# Patient Record
Sex: Female | Born: 1955 | Race: White | Hispanic: No | Marital: Single | Smoking: Never smoker
Health system: Southern US, Community
[De-identification: ages and names within clinical notes are randomized; demographics above are authoritative.]

## PROBLEM LIST (undated history)

## (undated) DIAGNOSIS — R7303 Prediabetes: Secondary | ICD-10-CM

## (undated) DIAGNOSIS — I519 Heart disease, unspecified: Secondary | ICD-10-CM

## (undated) DIAGNOSIS — N809 Endometriosis, unspecified: Secondary | ICD-10-CM

## (undated) DIAGNOSIS — C801 Malignant (primary) neoplasm, unspecified: Secondary | ICD-10-CM

## (undated) DIAGNOSIS — I4891 Unspecified atrial fibrillation: Secondary | ICD-10-CM

## (undated) DIAGNOSIS — D649 Anemia, unspecified: Secondary | ICD-10-CM

## (undated) DIAGNOSIS — N289 Disorder of kidney and ureter, unspecified: Secondary | ICD-10-CM

## (undated) DIAGNOSIS — M109 Gout, unspecified: Secondary | ICD-10-CM

## (undated) DIAGNOSIS — E669 Obesity, unspecified: Secondary | ICD-10-CM

## (undated) DIAGNOSIS — E039 Hypothyroidism, unspecified: Secondary | ICD-10-CM

## (undated) DIAGNOSIS — I517 Cardiomegaly: Secondary | ICD-10-CM

## (undated) DIAGNOSIS — I499 Cardiac arrhythmia, unspecified: Secondary | ICD-10-CM

## (undated) DIAGNOSIS — K219 Gastro-esophageal reflux disease without esophagitis: Secondary | ICD-10-CM

## (undated) HISTORY — PX: TONSILLECTOMY: SUR1361

## (undated) HISTORY — PX: ADENOIDECTOMY: SUR15

## (undated) HISTORY — PX: DILATION AND CURETTAGE OF UTERUS: SHX78

## (undated) HISTORY — PX: ABDOMINAL HYSTERECTOMY: SHX81

---

## 2012-02-25 ENCOUNTER — Ambulatory Visit: Payer: Self-pay | Admitting: Internal Medicine

## 2014-07-08 ENCOUNTER — Ambulatory Visit: Payer: Self-pay | Admitting: Family Medicine

## 2015-05-18 ENCOUNTER — Ambulatory Visit
Admission: EM | Admit: 2015-05-18 | Discharge: 2015-05-18 | Disposition: A | Discharge status: Home / Self Care | Payer: BLUE CROSS/BLUE SHIELD | Attending: Family Medicine | Admitting: Family Medicine

## 2015-05-18 ENCOUNTER — Encounter: Payer: Self-pay | Admitting: *Deleted

## 2015-05-18 DIAGNOSIS — N390 Urinary tract infection, site not specified: Secondary | ICD-10-CM

## 2015-05-18 DIAGNOSIS — J069 Acute upper respiratory infection, unspecified: Secondary | ICD-10-CM

## 2015-05-18 HISTORY — DX: Heart disease, unspecified: I51.9

## 2015-05-18 HISTORY — DX: Prediabetes: R73.03

## 2015-05-18 HISTORY — DX: Cardiomegaly: I51.7

## 2015-05-18 HISTORY — DX: Hypothyroidism, unspecified: E03.9

## 2015-05-18 HISTORY — DX: Endometriosis, unspecified: N80.9

## 2015-05-18 HISTORY — DX: Cardiac arrhythmia, unspecified: I49.9

## 2015-05-18 HISTORY — DX: Anemia, unspecified: D64.9

## 2015-05-18 HISTORY — DX: Gastro-esophageal reflux disease without esophagitis: K21.9

## 2015-05-18 HISTORY — DX: Disorder of kidney and ureter, unspecified: N28.9

## 2015-05-18 HISTORY — DX: Gout, unspecified: M10.9

## 2015-05-18 HISTORY — DX: Obesity, unspecified: E66.9

## 2015-05-18 HISTORY — DX: Malignant (primary) neoplasm, unspecified: C80.1

## 2015-05-18 LAB — URINALYSIS COMPLETE WITH MICROSCOPIC (ARMC ONLY)
Glucose, UA: NEGATIVE mg/dL
NITRITE: POSITIVE — AB
PH: 6 (ref 5.0–8.0)
SPECIFIC GRAVITY, URINE: 1.02 (ref 1.005–1.030)

## 2015-05-18 MED ORDER — SULFAMETHOXAZOLE-TRIMETHOPRIM 800-160 MG PO TABS: 1.0000 | ORAL_TABLET | Freq: Two times a day (BID) | ORAL | Status: AC

## 2015-05-18 NOTE — ED Notes (Signed)
Patient started having symptoms of headache and unusual sensation while urinating last Friday. Blood present in urine on Saturday. No symptoms of burning or pain. Patient does have nasal congestion and ear fullness lasting for the past 1.5 weeks.

## 2015-05-18 NOTE — Discharge Instructions (Signed)
Take medication as prescribed. Rest. Drink plenty of fluids.   Follow up closely with your primary care physician this week. Return to Urgent care for new or worsening concerns.   Urinary Tract Infection Urinary tract infections (UTIs) can develop anywhere along your urinary tract. Your urinary tract is your body's drainage system for removing wastes and extra water. Your urinary tract includes two kidneys, two ureters, a bladder, and a urethra. Your kidneys are a pair of bean-shaped organs. Each kidney is about the size of your fist. They are located below your ribs, one on each side of your spine. CAUSES Infections are caused by microbes, which are microscopic organisms, including fungi, viruses, and bacteria. These organisms are so small that they can only be seen through a microscope. Bacteria are the microbes that most commonly cause UTIs. SYMPTOMS  Symptoms of UTIs may vary by age and gender of the patient and by the location of the infection. Symptoms in young women typically include a frequent and intense urge to urinate and a painful, burning feeling in the bladder or urethra during urination. Older women and men are more likely to be tired, shaky, and weak and have muscle aches and abdominal pain. A fever may mean the infection is in your kidneys. Other symptoms of a kidney infection include pain in your back or sides below the ribs, nausea, and vomiting. DIAGNOSIS To diagnose a UTI, your caregiver will ask you about your symptoms. Your caregiver will also ask you to provide a urine sample. The urine sample will be tested for bacteria and white blood cells. White blood cells are made by your body to help fight infection. TREATMENT  Typically, UTIs can be treated with medication. Because most UTIs are caused by a bacterial infection, they usually can be treated with the use of antibiotics. The choice of antibiotic and length of treatment depend on your symptoms and the type of bacteria causing  your infection. HOME CARE INSTRUCTIONS  If you were prescribed antibiotics, take them exactly as your caregiver instructs you. Finish the medication even if you feel better after you have only taken some of the medication.  Drink enough water and fluids to keep your urine clear or pale yellow.  Avoid caffeine, tea, and carbonated beverages. They tend to irritate your bladder.  Empty your bladder often. Avoid holding urine for long periods of time.  Empty your bladder before and after sexual intercourse.  After a bowel movement, women should cleanse from front to back. Use each tissue only once. SEEK MEDICAL CARE IF:   You have back pain.  You develop a fever.  Your symptoms do not begin to resolve within 3 days. SEEK IMMEDIATE MEDICAL CARE IF:   You have severe back pain or lower abdominal pain.  You develop chills.  You have nausea or vomiting.  You have continued burning or discomfort with urination. MAKE SURE YOU:   Understand these instructions.  Will watch your condition.  Will get help right away if you are not doing well or get worse.   This information is not intended to replace advice given to you by your health care provider. Make sure you discuss any questions you have with your health care provider.   Document Released: 01/19/2005 Document Revised: 12/31/2014 Document Reviewed: 05/20/2011 Elsevier Interactive Patient Education 2016 [employer]  Upper Respiratory Infection, Adult Most upper respiratory infections (URIs) are a viral infection of the air passages leading to the lungs. A URI affects the nose, throat, and  upper air passages. The most common type of URI is nasopharyngitis and is typically referred to as "the common cold." URIs run their course and usually go away on their own. Most of the time, a URI does not require medical attention, but sometimes a bacterial infection in the upper airways can follow a viral infection. This is called a  secondary infection. Sinus and middle ear infections are common types of secondary upper respiratory infections. Bacterial pneumonia can also complicate a URI. A URI can worsen asthma and chronic obstructive pulmonary disease (COPD). Sometimes, these complications can require emergency medical care and may be life threatening.  CAUSES Almost all URIs are caused by viruses. A virus is a type of germ and can spread from one person to another.  RISKS FACTORS You may be at risk for a URI if:   You smoke.   You have chronic heart or lung disease.  You have a weakened defense (immune) system.   You are very young or very old.   You have nasal allergies or asthma.  You work in crowded or poorly ventilated areas.  You work in health care facilities or schools. SIGNS AND SYMPTOMS  Symptoms typically develop 2-3 days after you come in contact with a cold virus. Most viral URIs last 7-10 days. However, viral URIs from the influenza virus (flu virus) can last 14-18 days and are typically more severe. Symptoms may include:   Runny or stuffy (congested) nose.   Sneezing.   Cough.   Sore throat.   Headache.   Fatigue.   Fever.   Loss of appetite.   Pain in your forehead, behind your eyes, and over your cheekbones (sinus pain).  Muscle aches.  DIAGNOSIS  Your health care provider may diagnose a URI by:  Physical exam.  Tests to check that your symptoms are not due to another condition such as:  Strep throat.  Sinusitis.  Pneumonia.  Asthma. TREATMENT  A URI goes away on its own with time. It cannot be cured with medicines, but medicines may be prescribed or recommended to relieve symptoms. Medicines may help:  Reduce your fever.  Reduce your cough.  Relieve nasal congestion. HOME CARE INSTRUCTIONS   Take medicines only as directed by your health care provider.   Gargle warm saltwater or take cough drops to comfort your throat as directed by your health  care provider.  Use a warm mist humidifier or inhale steam from a shower to increase air moisture. This may make it easier to breathe.  Drink enough fluid to keep your urine clear or pale yellow.   Eat soups and other clear broths and maintain good nutrition.   Rest as needed.   Return to work when your temperature has returned to normal or as your health care provider advises. You may need to stay home longer to avoid infecting others. You can also use a face mask and careful hand washing to prevent spread of the virus.  Increase the usage of your inhaler if you have asthma.   Do not use any tobacco products, including cigarettes, chewing tobacco, or electronic cigarettes. If you need help quitting, ask your health care provider. PREVENTION  The best way to protect yourself from getting a cold is to practice good hygiene.   Avoid oral or hand contact with people with cold symptoms.   Wash your hands often if contact occurs.  There is no clear evidence that vitamin C, vitamin E, echinacea, or exercise reduces the chance  of developing a cold. However, it is always recommended to get plenty of rest, exercise, and practice good nutrition.  SEEK MEDICAL CARE IF:   You are getting worse rather than better.   Your symptoms are not controlled by medicine.   You have chills.  You have worsening shortness of breath.  You have brown or red mucus.  You have yellow or brown nasal discharge.  You have pain in your face, especially when you bend forward.  You have a fever.  You have swollen neck glands.  You have pain while swallowing.  You have white areas in the back of your throat. SEEK IMMEDIATE MEDICAL CARE IF:   You have severe or persistent:  Headache.  Ear pain.  Sinus pain.  Chest pain.  You have chronic lung disease and any of the following:  Wheezing.  Prolonged cough.  Coughing up blood.  A change in your usual mucus.  You have a stiff  neck.  You have changes in your:  Vision.  Hearing.  Thinking.  Mood. MAKE SURE YOU:   Understand these instructions.  Will watch your condition.  Will get help right away if you are not doing well or get worse.   This information is not intended to replace advice given to you by your health care provider. Make sure you discuss any questions you have with your health care provider.   Document Released: 10/05/2000 Document Revised: 08/26/2014 Document Reviewed: 07/17/2013 Elsevier Interactive Patient Education [employer].

## 2015-05-18 NOTE — ED Provider Notes (Signed)
[facility]  ____________________________________________  Time seen: Approximately 11:40 AM  I have reviewed the triage vital signs and the nursing notes.   HISTORY  Chief Complaint Urinary Tract Infection  HPI [name] [name] is a 60 y.o. female presents for complaints of urinary frequency, urinary urgency and some discomfort with urination 2 days. Patient reports that she has no complaints in absence of urination. Reports Saturday noticed some blood in urine, none since. Denies abdominal pain. Denies vaginal bleeding. Denies fevers. Denies back pain. Reports continues to eat and drink well. Denies known trigger. Denies vaginal complaints.    Patient also reports that she has had runny nose and nasal congestion with occasional cough 1.5 weeks. Denies productive cough. Denies chest pain or shortness of breath. Denies fevers. Denies purulent drainage. Denies sinus pain. Reports that congestion and cough have improved and feels is continuing to improve.  Denies chest pain, shortness breath, abdominal pain, back pain, fevers, vomiting, nausea, diarrhea, weakness or dizziness. Denies any recent antibiotic use.    Past Medical History  Diagnosis Date  . Cancer ([place])   . Obesity   . Prediabetes   . Hypothyroidism   . Gout   . Endometriosis   . Renal disorder   . Heart disease   . Ventricular hypertrophy   . Arrhythmia   . GERD (gastroesophageal reflux disease)   . Anemia     There are no active problems to display for this patient.   Past Surgical History  Procedure Laterality Date  . Tonsillectomy    . Adenoidectomy    . Dilation and curettage of uterus    . Abdominal hysterectomy      Current Outpatient Rx  Name  Route  Sig  Dispense  Refill  . allopurinol (ZYLOPRIM) 100 MG tablet   Oral   Take 100 mg by mouth 2 (two) times daily.         Haydee Salter Leaf POWD   Does not apply   1 Dose by Does not apply route daily.         Marland Kitchen  levothyroxine (SYNTHROID, LEVOTHROID) 175 MCG tablet   Oral   Take 175 mcg by mouth daily before breakfast.         . metoprolol (LOPRESSOR) 100 MG tablet   Oral   Take 100 mg by mouth daily.         Marland Kitchen triamterene-hydrochlorothiazide (MAXZIDE) 75-50 MG tablet   Oral   Take 0.5 tablets by mouth daily.         . valsartan (DIOVAN) 320 MG tablet   Oral   Take 320 mg by mouth daily.         . colchicine 0.6 MG tablet   Oral   Take 0.6 mg by mouth as needed.           Allergies Amlodipine and Tape  History reviewed. No pertinent family history.  Social History Social History  Substance Use Topics  . Smoking status: Never Smoker   . Smokeless tobacco: Never Used  . Alcohol Use: No    Review of Systems Constitutional: No fever/chills Eyes: No visual changes. ENT: No sore throat. Positive runny nose and nasal congestion. Cardiovascular: Denies chest pain. Respiratory: Denies shortness of breath. Gastrointestinal: No abdominal pain.  No nausea, no vomiting.  No diarrhea.  No constipation. Genitourinary: Positive for dysuria. Musculoskeletal: Negative for back pain. Skin: Negative for rash. Neurological: Negative for headaches, focal weakness or numbness.  10-point ROS otherwise negative.  ____________________________________________   PHYSICAL EXAM:  VITAL SIGNS: ED Triage Vitals  Enc Vitals Group     BP 05/18/15 1106 157/86 mmHg     Pulse Rate 05/18/15 1106 75     Resp 05/18/15 1106 20     Temp 05/18/15 1106 98.2 F (36.8 C)     Temp Source 05/18/15 1106 Oral     SpO2 05/18/15 1106 98 %     Weight 05/18/15 1106 345 lb (156.491 kg)     Height 05/18/15 1106 5\' 10"  (1.778 m)     Head Cir --      Peak Flow --      Pain Score 05/18/15 1125 0     Pain Loc --      Pain Edu? --      Excl. in [place]? --     Constitutional: Alert and oriented. Well appearing and in no acute distress. Eyes: Conjunctivae are normal. PERRL. EOMI. Head: Atraumatic. No sinus  tenderness to palpation. No swelling. No erythema.  Ears: no erythema, normal TMs bilaterally.   Nose: Nasal congestion with clear rhinorrhea.  Mouth/Throat: Mucous membranes are moist.  Oropharynx non-erythematous. No tonsillar swelling or exudate. Neck: No stridor.  No cervical spine tenderness to palpation. Hematological/Lymphatic/Immunilogical: No cervical lymphadenopathy. Cardiovascular: Normal rate, regular rhythm. Grossly normal heart sounds.  Good peripheral circulation. Respiratory: Normal respiratory effort.  No retractions. Lungs CTAB. No wheezes, rales or rhonchi. Good air movement. Gastrointestinal: Soft and nontender. Obese abdomen. Normal Bowel sounds.  No abdominal bruits. No CVA tenderness. Musculoskeletal: No lower or upper extremity tenderness nor edema. Bilateral pedal pulses equal and easily palpated.  Neurologic:  Normal speech and language. No gross focal neurologic deficits are appreciated. No gait instability. Skin:  Skin is warm, dry and intact. No rash noted. Psychiatric: Mood and affect are normal. Speech and behavior are normal.  ____________________________________________   LABS (all labs ordered are listed, but only abnormal results are displayed)  Labs Reviewed  URINALYSIS COMPLETEWITH MICROSCOPIC ([facility] ONLY) - Abnormal; Notable for the following:    Color, Urine AMBER (*)    APPearance CLOUDY (*)    Bilirubin Urine 1+ (*)    Ketones, ur TRACE (*)    Hgb urine dipstick 3+ (*)    Protein, ur >300 (*)    Nitrite POSITIVE (*)    Leukocytes, UA 1+ (*)    Bacteria, UA MANY (*)    Squamous Epithelial / LPF 0-5 (*)    All other components within normal limits  URINE CULTURE     INITIAL IMPRESSION / ASSESSMENT AND PLAN / ED COURSE  Pertinent labs & imaging results that were available during my care of the patient were reviewed by me and considered in my medical decision making (see chart for details).  Very well-appearing patient. No acute distress.  Presents for the complaints of 2 days of urinary frequency, urinary urgency as well some discomfort with urination. Denies abdominal pain. Denies fevers. Reports continues to eat and drink well. Also reports 1.5 weeks of runny nose and nasal congestion that patient reports has been improving. Lungs clear throughout. Abdomen soft and nontender. No CVA tenderness. Urinalysis positive for many bacteria, positive nitrite, 1+ leukocytes, too numerous to count RBCs and WBCs. Will treat urinary tract infection with oral Bactrim as well as culture urine. Suspect viral upper respiratory infection. Encouraged rest, fluids and PCP follow up. Encouraged repeat urinalysis in one week at PCP.  Discussed follow up with Primary care physician this week. Discussed follow  up and return parameters including no resolution or any worsening concerns. Patient verbalized understanding and agreed to plan.   ____________________________________________   FINAL CLINICAL IMPRESSION(S) / ED DIAGNOSES  Final diagnoses:  UTI (lower urinary tract infection)  Viral upper respiratory infection      Note: This dictation was prepared with Dragon dictation along with smaller phrase technology. Any transcriptional errors that result from this process are unintentional.    Marylene Land, NP 05/18/15 1242  Marylene Land, NP 05/18/15 1243

## 2015-05-20 LAB — URINE CULTURE

## 2016-04-03 ENCOUNTER — Ambulatory Visit
Admission: EM | Admit: 2016-04-03 | Discharge: 2016-04-03 | Disposition: A | Discharge status: Home / Self Care | Payer: BLUE CROSS/BLUE SHIELD | Attending: Family Medicine | Admitting: Family Medicine

## 2016-04-03 DIAGNOSIS — J209 Acute bronchitis, unspecified: Secondary | ICD-10-CM | POA: Diagnosis not present

## 2016-04-03 MED ORDER — AZITHROMYCIN 500 MG PO TABS: ORAL_TABLET | ORAL | 0 refills | Status: DC

## 2016-04-03 MED ORDER — HYDROCOD POLST-CPM POLST ER 10-8 MG/5ML PO SUER: 5.0000 mL | Freq: Two times a day (BID) | ORAL | 0 refills | Status: DC | PRN

## 2016-04-03 MED ORDER — FEXOFENADINE-PSEUDOEPHED ER 180-240 MG PO TB24: 1.0000 | ORAL_TABLET | Freq: Every day | ORAL | 0 refills | Status: DC

## 2016-04-03 MED ORDER — ALBUTEROL SULFATE HFA 108 (90 BASE) MCG/ACT IN AERS: 2.0000 | INHALATION_SPRAY | RESPIRATORY_TRACT | 0 refills | Status: DC | PRN

## 2016-04-03 NOTE — ED Triage Notes (Signed)
Pt reports cough and congestion starting on Tuesday. Coughing keeping her up at night. Yesterday started coughing up green phlegm. Had a headache when it first started and teeth hurt, but these sx have resolved.

## 2016-04-03 NOTE — ED Provider Notes (Signed)
[facility]    CSN: [id number] Arrival date & time: 04/03/16  1408     History   Chief Complaint Chief Complaint  Patient presents with  . Cough    HPI [name] [name] is a 60 y.o. female.   Patient is a 60 year old white female who's had a history of cough and congestion. She states that about 8 days ago she started coming down with what she felt was a URI. She states she works at [employer] system she is somewhat perturbed because she states the last 3 or 4 years she's had to get flu shots and the flu shots cause her to have upper respiratory tract infections after she's had the flu shot. She does not smoke. She states the symptoms started on Sunday by Wednesday she started having a deep cough the sore throat went away and now over the last 3-4 days she is coughing up thick green sputum that she's having chills. She states that she's had a history of bronchitis pneumonia before in the past and she is concerned this illness and there as well. She is allergic to  amlodipine and tape. She never smoked and family history is not pertinent to today's visit. She does states that she had a history of asthma before in the past.   The history is provided by the patient. No language interpreter was used.  Cough  Cough characteristics:  Productive Sputum characteristics:  Green Severity:  Moderate Duration:  8 days Timing:  Constant Progression:  Worsening Chronicity:  New Smoker: no   Context: occupational exposure and upper respiratory infection   Context: not exposure to allergens and not fumes   Relieved by:  Nothing Worsened by:  Nothing Ineffective treatments:  Decongestant Associated symptoms: sinus congestion, sore throat and wheezing     Past Medical History:  Diagnosis Date  . Anemia   . Arrhythmia   . Cancer ([place])   . Endometriosis   . GERD (gastroesophageal reflux disease)   . Gout   . Heart disease   . Hypothyroidism   . Obesity   . Prediabetes     . Renal disorder   . Ventricular hypertrophy     There are no active problems to display for this patient.   Past Surgical History:  Procedure Laterality Date  . ABDOMINAL HYSTERECTOMY    . ADENOIDECTOMY    . DILATION AND CURETTAGE OF UTERUS    . TONSILLECTOMY      OB History    No data available       Home Medications    Prior to Admission medications   Medication Sig Start Date End Date Taking? Authorizing Provider  albuterol (PROVENTIL HFA;VENTOLIN HFA) 108 (90 Base) MCG/ACT inhaler Inhale 2 puffs into the lungs every 4 (four) hours as needed for wheezing or shortness of breath. 04/03/16   Frederich Cha, MD  allopurinol (ZYLOPRIM) 100 MG tablet Take 100 mg by mouth 2 (two) times daily.    Historical Provider, MD  azithromycin (ZITHROMAX) 500 MG tablet 1 tablet daily for the next 5 days 04/03/16   Frederich Cha, MD  chlorpheniramine-HYDROcodone [facility]) 10-8 MG/5ML SUER Take 5 mLs by mouth every 12 (twelve) hours as needed for cough. 04/03/16   Frederich Cha, MD  colchicine 0.6 MG tablet Take 0.6 mg by mouth as needed.    Historical Provider, MD  fexofenadine-pseudoephedrine (ALLEGRA-D ALLERGY & CONGESTION) 180-240 MG 24 hr tablet Take 1 tablet by mouth daily. 04/03/16   Cornelia Copa  Alveta Heimlich, MD  Gymnema Sylvestris Leaf POWD 1 Dose by Does not apply route daily.    Historical Provider, MD  levothyroxine (SYNTHROID, LEVOTHROID) 175 MCG tablet Take 175 mcg by mouth daily before breakfast.    Historical Provider, MD  metoprolol (LOPRESSOR) 100 MG tablet Take 100 mg by mouth daily.    Historical Provider, MD  triamterene-hydrochlorothiazide (MAXZIDE) 75-50 MG tablet Take 0.5 tablets by mouth daily.    Historical Provider, MD  valsartan (DIOVAN) 320 MG tablet Take 320 mg by mouth daily.    Historical Provider, MD    Family History History reviewed. No pertinent family history.  Social History Social History  Substance Use Topics  . Smoking status: Never Smoker  .  Smokeless tobacco: Never Used  . Alcohol use No     Allergies   Amlodipine and Tape   Review of Systems Review of Systems  HENT: Positive for sore throat.   Respiratory: Positive for cough and wheezing.      Physical Exam Triage Vital Signs ED Triage Vitals  Enc Vitals Group     BP 04/03/16 1510 (!) 154/81     Pulse Rate 04/03/16 1510 60     Resp 04/03/16 1510 20     Temp 04/03/16 1510 98 F (36.7 C)     Temp Source 04/03/16 1510 Oral     SpO2 04/03/16 1510 100 %     Weight 04/03/16 1506 (!) 345 lb (156.5 kg)     Height 04/03/16 1506 5\' 8"  (1.727 m)     Head Circumference --      Peak Flow --      Pain Score --      Pain Loc --      Pain Edu? --      Excl. in [place]? --    No data found.   Updated Vital Signs BP (!) 154/81 (BP Location: Right Arm)   Pulse 60   Temp 98 F (36.7 C) (Oral)   Resp 20   Ht 5\' 8"  (1.727 m)   Wt (!) 345 lb (156.5 kg)   SpO2 100%   BMI 52.46 kg/m   Visual Acuity Right Eye Distance:   Left Eye Distance:   Bilateral Distance:    Right Eye Near:   Left Eye Near:    Bilateral Near:     Physical Exam  Constitutional: She is oriented to person, place, and time. She appears well-developed.  HENT:  Head: Normocephalic and atraumatic.  Right Ear: Hearing, tympanic membrane, external ear and ear canal normal.  Left Ear: Hearing, tympanic membrane, external ear and ear canal normal.  Nose: Nose normal. No mucosal edema.  Eyes: Pupils are equal, round, and reactive to light.  Neck: Normal range of motion.  Cardiovascular: Normal rate, regular rhythm and normal heart sounds.   Pulmonary/Chest: Effort normal.  Neurological: She is alert and oriented to person, place, and time.  Skin: Skin is warm.  Psychiatric: She has a normal mood and affect.  Vitals reviewed.    UC Treatments / Results  Labs (all labs ordered are listed, but only abnormal results are displayed) Labs Reviewed - No data to display  EKG  EKG  Interpretation None       Radiology No results found.  Procedures Procedures (including critical care time)  Medications Ordered in UC Medications - No data to display   Initial Impression / Assessment and Plan / UC Course  I have reviewed the triage vital signs and the nursing  notes.  Pertinent labs & imaging results that were available during my care of the patient were reviewed by me and considered in my medical decision making (see chart for details).  Clinical Course    Explained to her that we can do a flu test if she really wants [place] to but if she she is worried that she had the flu blood work probably be the most reliable test. Also 7 days out she is Dr. be a candidate for Tamiflu at this time. Also since sore throats the longer bothering her but would hold off doing a strep test. We'll place her on Zithromax 500 mg 1 tablet day for 5 days ,Tussionex 1 teaspoon twice a day for 10-12 days when necessary as needed, Allegra-D 1 tablet daily and albuterol inhaler 2 puff every 4 hours when necessary for bronchospasms. Will give a note for work for today and tomorrow as well.    Final Clinical Impressions(s) / UC Diagnoses   Final diagnoses:  Acute bronchitis with bronchospasm    New Prescriptions Discharge Medication List as of 04/03/2016  3:50 PM    START taking these medications   Details  albuterol (PROVENTIL HFA;VENTOLIN HFA) 108 (90 Base) MCG/ACT inhaler Inhale 2 puffs into the lungs every 4 (four) hours as needed for wheezing or shortness of breath., Starting Sun 04/03/2016, Normal    azithromycin (ZITHROMAX) 500 MG tablet 1 tablet daily for the next 5 days, Normal    chlorpheniramine-HYDROcodone (TUSSIONEX PENNKINETIC ER) 10-8 MG/5ML SUER Take 5 mLs by mouth every 12 (twelve) hours as needed for cough., Starting Sun 04/03/2016, Normal    fexofenadine-pseudoephedrine (ALLEGRA-D ALLERGY & CONGESTION) 180-240 MG 24 hr tablet Take 1 tablet by mouth daily., Starting  Sun 04/03/2016, Normal        Results for orders placed or performed during the hospital encounter of 05/18/15  Urine culture  Result Value Ref Range   Specimen Description URINE, CLEAN CATCH    Special Requests Immunocompromised    Culture >=100,000 COLONIES/mL ESCHERICHIA COLI    Report Status 05/20/2015 FINAL    Organism ID, Bacteria ESCHERICHIA COLI       Susceptibility   Escherichia coli - MIC*    AMPICILLIN Value in next row Sensitive      SENSITIVE4    CEFTRIAXONE Value in next row Sensitive      SENSITIVE<=1    CIPROFLOXACIN Value in next row Sensitive      SENSITIVE<=0.25    GENTAMICIN Value in next row Sensitive      SENSITIVE<=1    IMIPENEM Value in next row Sensitive      SENSITIVE<=0.25    NITROFURANTOIN Value in next row Sensitive      SENSITIVE<=16    TRIMETH/SULFA Value in next row Sensitive      SENSITIVE<=20    PIP/TAZO Value in next row Sensitive      SENSITIVE<=4    AMPICILLIN/SULBACTAM Value in next row Sensitive      SENSITIVE<=2    * >=100,000 COLONIES/mL ESCHERICHIA COLI  Urinalysis complete, with microscopic  Result Value Ref Range   Color, Urine AMBER (A) YELLOW   APPearance CLOUDY (A) CLEAR   Glucose, UA NEGATIVE NEGATIVE mg/dL   Bilirubin Urine 1+ (A) NEGATIVE   Ketones, ur TRACE (A) NEGATIVE mg/dL   Specific Gravity, Urine 1.020 1.005 - 1.030   Hgb urine dipstick 3+ (A) NEGATIVE   pH 6.0 5.0 - 8.0   Protein, ur >300 (A) NEGATIVE mg/dL   Nitrite POSITIVE (A)  NEGATIVE   Leukocytes, UA 1+ (A) NEGATIVE   RBC / HPF TOO NUMEROUS TO COUNT 0 - 5 RBC/hpf   WBC, UA TOO NUMEROUS TO COUNT 0 - 5 WBC/hpf   Bacteria, UA MANY (A) NONE SEEN   Squamous Epithelial / LPF 0-5 (A) NONE SEEN     Frederich Cha, MD 04/03/16 1556

## 2016-04-06 ENCOUNTER — Telehealth: Payer: Self-pay

## 2016-04-06 NOTE — Telephone Encounter (Signed)
Courtesy call back completed today for patient's recent visit at [facility]. Patient did not answer, left message on machine to call back with any questions or concerns.   

## 2017-05-29 ENCOUNTER — Ambulatory Visit
Admission: RE | Admit: 2017-05-29 | Discharge: 2017-05-29 | Disposition: A | Discharge status: Home / Self Care | Payer: BLUE CROSS/BLUE SHIELD | Source: Ambulatory Visit | Attending: Medical Oncology | Admitting: Medical Oncology

## 2017-05-29 ENCOUNTER — Other Ambulatory Visit: Payer: Self-pay | Admitting: Medical Oncology

## 2017-05-29 ENCOUNTER — Ambulatory Visit
Admission: RE | Admit: 2017-05-29 | Discharge: 2017-05-29 | Disposition: A | Discharge status: Home / Self Care | Payer: BLUE CROSS/BLUE SHIELD | Source: Ambulatory Visit | Attending: Family Medicine | Admitting: Family Medicine

## 2017-05-29 DIAGNOSIS — J9811 Atelectasis: Secondary | ICD-10-CM | POA: Insufficient documentation

## 2017-05-29 DIAGNOSIS — R05 Cough: Secondary | ICD-10-CM | POA: Insufficient documentation

## 2017-05-29 DIAGNOSIS — R058 Other specified cough: Secondary | ICD-10-CM

## 2017-05-29 DIAGNOSIS — D71 Functional disorders of polymorphonuclear neutrophils: Secondary | ICD-10-CM | POA: Diagnosis not present

## 2017-09-22 ENCOUNTER — Other Ambulatory Visit: Payer: Self-pay

## 2017-09-22 ENCOUNTER — Ambulatory Visit
Admission: EM | Admit: 2017-09-22 | Discharge: 2017-09-22 | Disposition: A | Discharge status: Home / Self Care | Payer: BLUE CROSS/BLUE SHIELD | Attending: Internal Medicine | Admitting: Internal Medicine

## 2017-09-22 DIAGNOSIS — S90421A Blister (nonthermal), right great toe, initial encounter: Secondary | ICD-10-CM | POA: Diagnosis not present

## 2017-09-22 DIAGNOSIS — R2242 Localized swelling, mass and lump, left lower limb: Secondary | ICD-10-CM | POA: Diagnosis not present

## 2017-09-22 DIAGNOSIS — R2241 Localized swelling, mass and lump, right lower limb: Secondary | ICD-10-CM

## 2017-09-22 DIAGNOSIS — M7989 Other specified soft tissue disorders: Secondary | ICD-10-CM

## 2017-09-22 MED ORDER — CEPHALEXIN 500 MG PO CAPS: 500.0000 mg | ORAL_CAPSULE | Freq: Two times a day (BID) | ORAL | 0 refills | Status: AC

## 2017-09-22 NOTE — Discharge Instructions (Addendum)
Elevate right foot, preferably above heart level, as much as possible over the next few days.  When up and about again next week, might benefit from an elastic stocking, to press tissue fluid away from the blister.  Prescription for cephalexin, for possible mild infection around blister, sent to the pharmacy.  Note for work Midwife.  Anticipate gradual improvement over the next week or two.  Best to keep blister intact if possible; if ruptured, Wash gently with soap/water 1-2 times daily, apply antibiotic ointment and bandage.  Recheck if any increasing redness/swelling/pain/drainage or new fever>100.5, or if not starting to improve in a few days.

## 2017-09-22 NOTE — ED Provider Notes (Signed)
[facility]    CSN: [id number] Arrival date & time: 09/22/17  1237     History   Chief Complaint Chief Complaint  Patient presents with  . Blister    right foot    HPI [name] [name] is a 62 y.o. female.   She has a complex past medical history including renal disorder, heart disease, prediabetes.  She presents today with a Arizmendi blister on her right great toe just proximal to the toenail.  This started 2 days ago while she was at work, felt something rubbing, and now has this Denis blister.  Blister is intact, but uncomfortable.  It has increased in size, when she is up and around, since the onset.  She has some baseline mild to moderate leg swelling in both legs, with venous stasis changes.  She works nights, in a lab, and is on her feet all night.    HPI  Past Medical History:  Diagnosis Date  . Anemia   . Arrhythmia   . Cancer ([place])   . Endometriosis   . GERD (gastroesophageal reflux disease)   . Gout   . Heart disease   . Hypothyroidism   . Obesity   . Prediabetes   . Renal disorder   . Ventricular hypertrophy     Past Surgical History:  Procedure Laterality Date  . ABDOMINAL HYSTERECTOMY    . ADENOIDECTOMY    . DILATION AND CURETTAGE OF UTERUS    . TONSILLECTOMY      OB History   None      Home Medications    Prior to Admission medications   Medication Sig Start Date End Date Taking? Authorizing Provider  allopurinol (ZYLOPRIM) 100 MG tablet Take 100 mg by mouth 2 (two) times daily.   Yes [provider]  levothyroxine (SYNTHROID, LEVOTHROID) 175 MCG tablet Take 175 mcg by mouth daily before breakfast.   Yes [provider]  losartan (COZAAR) 100 MG tablet Take by mouth. 09/11/17 09/11/18 Yes [provider]  metoprolol (LOPRESSOR) 100 MG tablet Take 100 mg by mouth daily.   Yes [provider]  triamterene-hydrochlorothiazide (MAXZIDE) 75-50 MG tablet Take 0.5 tablets by mouth daily.   Yes [provider]  cephALEXin (KEFLEX) 500 MG capsule Take 1 capsule (500 mg total) by mouth 2 (two) times daily for 5 days. 09/22/17 09/27/17  Wynona Luna, MD  chlorpheniramine-HYDROcodone [facility] PENNKINETIC ER) 10-8 MG/5ML SUER Take 5 mLs by mouth every 12 (twelve) hours as needed for cough. 04/03/16   Frederich Cha, MD  colchicine 0.6 MG tablet Take 0.6 mg by mouth as needed.    [provider]  fexofenadine-pseudoephedrine (ALLEGRA-D ALLERGY & CONGESTION) 180-240 MG 24 hr tablet Take 1 tablet by mouth daily. 04/03/16   Frederich Cha, MD  Gymnema Sylvestris Leaf POWD 1 Dose by Does not apply route daily.    [provider]  valsartan (DIOVAN) 320 MG tablet Take 320 mg by mouth daily.    [provider]    Family History Family History  Problem Relation Age of Onset  . Diabetes Mother   . Hypertension Mother   . Heart attack Father     Social History Social History   Tobacco Use  . Smoking status: Never Smoker  . Smokeless tobacco: Never Used  Substance Use Topics  . Alcohol use: No  . Drug use: No     Allergies   Amlodipine and Tape   Review of Systems Review of Systems  All other systems reviewed and are negative.    Physical Exam Triage Vital Signs ED Triage Vitals  Enc Vitals Group     BP 09/22/17 1338 (!) 148/77     Pulse Rate 09/22/17 1338 69     Resp 09/22/17 1338 17     Temp 09/22/17 1338 98.1 F (36.7 C)     Temp Source 09/22/17 1338 Oral     SpO2 09/22/17 1338 99 %     Weight 09/22/17 1334 (!) 340 lb (154.2 kg)     Height 09/22/17 1334 5\' 10"  (1.778 m)     Pain Score 09/22/17 1334 8     Pain Loc --    Updated Vital Signs BP (!) 148/77 (BP Location: Right Arm)   Pulse 69   Temp 98.1 F (36.7 C) (Oral)   Resp 17   Ht 5\' 10"  (1.778 m)   Wt (!) 340 lb (154.2 kg)   SpO2 99%   BMI 48.78 kg/m  Physical Exam  Constitutional: She is oriented to person, place, and time. No distress.  HENT:  Head: Atraumatic.    Eyes:  Conjugate gaze observed, no eye redness/discharge  Neck: Neck supple.  Cardiovascular: Normal rate.  Pulmonary/Chest: No respiratory distress.  Abdominal: She exhibits no distension.  Musculoskeletal: Normal range of motion.  Bilateral lower extremities have 2+ pitting edema starting about two thirds of the way up each shin; there are venous stasis changes, brown pigmentation, in the lower half of each shin.  Neurological: She is alert and oriented to person, place, and time.  Skin: Skin is warm and dry.  There is a 1 x 1.5 inch tense blister with clear fluid on the distal right great toe, just proximal to the toenail.  There is a scant rim of erythema at the left aspect, no tenderness.  Nursing note and vitals reviewed.    UC Treatments / Results  Labs (all labs ordered are listed, but only abnormal results are displayed) Labs Reviewed - No data to display  EKG None  Radiology No results found.  Procedures Procedures (including critical care time)  Medications Ordered in UC Medications - No data to display  Final Clinical Impressions(s) / UC Diagnoses   Final diagnoses:  Blister (nonthermal), right great toe, initial encounter  Leg swelling     Discharge Instructions     Elevate right foot, preferably above heart level, as much as possible over the next few days.  When up and about again next week, might benefit from an elastic stocking, to press tissue fluid away from the blister.  Prescription for cephalexin, for possible mild infection around blister, sent to the pharmacy.  Note for work Midwife.  Anticipate gradual improvement over the next week or two.  Best to keep blister intact if possible; if ruptured, Wash gently with soap/water 1-2 times daily, apply antibiotic ointment and bandage.  Recheck if any increasing redness/swelling/pain/drainage or new fever>100.5, or if not starting to improve in a few days.     ED Prescriptions    Medication Sig Dispense  Auth. Provider   cephALEXin (KEFLEX) 500 MG capsule Take 1 capsule (500 mg total) by mouth 2 (two) times daily for 5 days. 10 capsule Wynona Luna, MD        Wynona Luna, MD 09/23/17 [id number]

## 2017-09-22 NOTE — ED Triage Notes (Signed)
Patient complains of right foot blister. Patient states that this started on Wednesday when her shoe was rubbing the area. Patient states that she put a bandage on the area yesterday and noticed that the blister became painful. Patient states that the area has doubled in size.

## 2017-10-04 ENCOUNTER — Other Ambulatory Visit
Admission: RE | Admit: 2017-10-04 | Discharge: 2017-10-04 | Disposition: A | Discharge status: Home / Self Care | Payer: BLUE CROSS/BLUE SHIELD | Source: Ambulatory Visit | Attending: Internal Medicine | Admitting: Internal Medicine

## 2017-10-04 ENCOUNTER — Encounter: Payer: BLUE CROSS/BLUE SHIELD | Attending: Internal Medicine | Admitting: Internal Medicine

## 2017-10-04 DIAGNOSIS — L97511 Non-pressure chronic ulcer of other part of right foot limited to breakdown of skin: Secondary | ICD-10-CM | POA: Insufficient documentation

## 2017-10-04 DIAGNOSIS — Z6841 Body Mass Index (BMI) 40.0 and over, adult: Secondary | ICD-10-CM | POA: Insufficient documentation

## 2017-10-04 DIAGNOSIS — L03115 Cellulitis of right lower limb: Secondary | ICD-10-CM | POA: Insufficient documentation

## 2017-10-04 DIAGNOSIS — E039 Hypothyroidism, unspecified: Secondary | ICD-10-CM | POA: Insufficient documentation

## 2017-10-04 DIAGNOSIS — N182 Chronic kidney disease, stage 2 (mild): Secondary | ICD-10-CM | POA: Insufficient documentation

## 2017-10-04 DIAGNOSIS — Z8542 Personal history of malignant neoplasm of other parts of uterus: Secondary | ICD-10-CM | POA: Insufficient documentation

## 2017-10-04 DIAGNOSIS — K219 Gastro-esophageal reflux disease without esophagitis: Secondary | ICD-10-CM | POA: Insufficient documentation

## 2017-10-04 DIAGNOSIS — I129 Hypertensive chronic kidney disease with stage 1 through stage 4 chronic kidney disease, or unspecified chronic kidney disease: Secondary | ICD-10-CM | POA: Diagnosis not present

## 2017-10-04 DIAGNOSIS — B999 Unspecified infectious disease: Secondary | ICD-10-CM | POA: Diagnosis not present

## 2017-10-04 DIAGNOSIS — E669 Obesity, unspecified: Secondary | ICD-10-CM | POA: Diagnosis not present

## 2017-10-04 DIAGNOSIS — M109 Gout, unspecified: Secondary | ICD-10-CM | POA: Insufficient documentation

## 2017-10-06 LAB — AEROBIC CULTURE  (SUPERFICIAL SPECIMEN)

## 2017-10-06 LAB — AEROBIC CULTURE W GRAM STAIN (SUPERFICIAL SPECIMEN): Culture: NO GROWTH

## 2017-10-06 NOTE — Progress Notes (Signed)
Crusoe, JESS SULAK ([id number]) Visit Report for 10/04/2017 Abuse/Suicide Risk Screen Details Patient Name: [name] [name], [name] [name] Date of Service: 10/04/2017 9:45 AM Medical Record Number: [id number] Patient Account Number: [id number] Date of Birth/Sex: 12-07-1955 (62 y.o. Female) Treating RN: Carolyne Fiscal, Debi Primary Care Normajean Nash: Barbaraann Boys Other Clinician: Referring Katelyne Galster: Barbaraann Boys Treating Char Feltman/Extender: Ricard Dillon Weeks in Treatment: 0 Abuse/Suicide Risk Screen Items Answer ABUSE/SUICIDE RISK SCREEN: Has anyone close to you tried to hurt or harm you recentlyo No Do you feel uncomfortable with anyone in your familyo No Has anyone forced you do things that you didnot want to doo No Do you have any thoughts of harming yourselfo No Patient displays signs or symptoms of abuse and/or neglect. No Electronic Signature(s) Signed: 10/04/2017 3:54:16 PM By: Alric Quan Entered By: Alric Quan on 10/04/2017 10:26:53 Likins, [name] ([id number]) -------------------------------------------------------------------------------- Activities of Daily Living Details Patient Name: Kiesler, [name] Date of Service: 10/04/2017 9:45 AM Medical Record Number: [id number] Patient Account Number: [id number] Date of Birth/Sex: 16-Apr-1956 (62 y.o. Female) Treating RN: Carolyne Fiscal, Debi Primary Care Jamillah Camilo: Barbaraann Boys Other Clinician: Referring Kamren Heintzelman: Barbaraann Boys Treating Iwalani Templeton/Extender: Ricard Dillon Weeks in Treatment: 0 Activities of Daily Living Items Answer Activities of Daily Living (Please select one for each item) Drive Automobile Completely Able Take Medications Completely Able Use Telephone Completely Able Care for Appearance Completely Able Use Toilet Completely Able Bath / Shower Completely Able Dress Self Completely Able Feed Self Completely Able Walk Completely Able Get In / Out Bed Completely Able Housework Completely Able Prepare Meals  Completely [place] for Self Completely Able Electronic Signature(s) Signed: 10/04/2017 3:54:16 PM By: Alric Quan Entered By: Alric Quan on 10/04/2017 10:27:14 Orr, [name] ([id number]) -------------------------------------------------------------------------------- Education Assessment Details Patient Name: [name] Date of Service: 10/04/2017 9:45 AM Medical Record Number: [id number] Patient Account Number: [id number] Date of Birth/Sex: July 25, 1955 (62 y.o. Female) Treating RN: Carolyne Fiscal, Debi Primary Care Sumedh Shinsato: Barbaraann Boys Other Clinician: Referring Elliyah Liszewski: Barbaraann Boys Treating Devany Aja/Extender: Tito Dine in Treatment: 0 Primary Learner Assessed: Patient Learning Preferences/Education Level/Primary Language Learning Preference: Explanation, Printed Material Highest Education Level: College or Above Preferred Language: English Cognitive Barrier Assessment/Beliefs Language Barrier: No Translator Needed: No Memory Deficit: No Emotional Barrier: No Cultural/Religious Beliefs Affecting Medical Care: No Physical Barrier Assessment Impaired Vision: Yes Glasses Impaired Hearing: No Decreased Hand dexterity: No Knowledge/Comprehension Assessment Knowledge Level: Medium Comprehension Level: Medium Ability to understand written Medium instructions: Ability to understand verbal Medium instructions: Motivation Assessment Anxiety Level: Calm Cooperation: Cooperative Education Importance: Acknowledges Need Interest in Health Problems: Asks Questions Perception: Coherent Willingness to Engage in Self- Medium Management Activities: Readiness to Engage in Self- Medium Management Activities: Electronic Signature(s) Signed: 10/04/2017 3:54:16 PM By: Alric Quan Entered By: Alric Quan on 10/04/2017 10:27:35 Durney, [name]  ([id number]) -------------------------------------------------------------------------------- Fall Risk Assessment Details Patient Name: [name] Date of Service: 10/04/2017 9:45 AM Medical Record Number: [id number] Patient Account Number: [id number] Date of Birth/Sex: 1955-07-19 (62 y.o. Female) Treating RN: Carolyne Fiscal, Debi Primary Care Kiyoshi Schaab: Barbaraann Boys Other Clinician: Referring Leightyn Cina: Barbaraann Boys Treating Zayden Maffei/Extender: Tito Dine in Treatment: 0 Fall Risk Assessment Items Have you had 2 or more falls in the last 12 monthso 0 No Have you had any fall that resulted in injury in the last 12 monthso 0 No FALL RISK ASSESSMENT: History of falling - immediate or within 3 months 0 No Secondary diagnosis 0 No Ambulatory aid None/bed rest/wheelchair/nurse 0 No Crutches/cane/walker 15  Yes Furniture 0 No IV Access/Saline Lock 0 No Gait/Training Normal/bed rest/immobile 0 No Weak 0 No Impaired 0 No Mental Status Oriented to own ability 0 Yes Electronic Signature(s) Signed: 10/04/2017 3:54:16 PM By: Alric Quan Entered By: Alric Quan on 10/04/2017 10:28:12 Soutar, [name] ([id number]) -------------------------------------------------------------------------------- Foot Assessment Details Patient Name: [name] Date of Service: 10/04/2017 9:45 AM Medical Record Number: [id number] Patient Account Number: [id number] Date of Birth/Sex: [birth year] (62 y.o. Female) Treating RN: Carolyne Fiscal, Debi Primary Care Darrly Loberg: Barbaraann Boys Other Clinician: Referring Rozalyn Osland: Barbaraann Boys Treating Lavada Langsam/Extender: Ricard Dillon Weeks in Treatment: 0 Foot Assessment Items Site Locations + = Sensation present, - = Sensation absent, C = Callus, U = Ulcer R = Redness, W = Warmth, M = Maceration, PU = Pre-ulcerative lesion F = Fissure, S = Swelling, D = Dryness Assessment Right: Left: Other Deformity: No No Prior Foot Ulcer: No  No Prior Amputation: No No Charcot Joint: No No Ambulatory Status: Ambulatory With Help Assistance Device: Cane Gait: Steady Electronic Signature(s) Signed: 10/04/2017 3:54:16 PM By: Alric Quan Entered By: Alric Quan on 10/04/2017 10:30:43 Vondrak, [name] ([id number]) -------------------------------------------------------------------------------- Nutrition Risk Assessment Details Patient Name: [name] Date of Service: 10/04/2017 9:45 AM Medical Record Number: [id number] Patient Account Number: [id number] Date of Birth/Sex: [birth year] (62 y.o. Female) Treating RN: Carolyne Fiscal, Debi Primary Care Trecia Maring: Barbaraann Boys Other Clinician: Referring Makinley Muscato: Barbaraann Boys Treating Kaleiyah Polsky/Extender: Ricard Dillon Weeks in Treatment: 0 Height (in): 69 Weight (lbs): 338 Body Mass Index (BMI): 49.9 Nutrition Risk Assessment Items NUTRITION RISK SCREEN: I have an illness or condition that made me change the kind and/or amount of 0 No food I eat I eat fewer than two meals per day 0 No I eat few fruits and vegetables, or milk products 0 No I have three or more drinks of beer, liquor or wine almost every day 0 No I have tooth or mouth problems that make it hard for me to eat 0 No I don't always have enough money to buy the food I need 0 No I eat alone most of the time 0 No I take three or more different prescribed or over-the-counter drugs a day 1 Yes Without wanting to, I have lost or gained 10 pounds in the last six months 0 No I am not always physically able to shop, cook and/or feed myself 0 No Nutrition Protocols Good Risk Protocol Moderate Risk Protocol Electronic Signature(s) Signed: 10/04/2017 3:54:16 PM By: Alric Quan Entered By: Alric Quan on 10/04/2017 10:28:34

## 2017-10-07 NOTE — Progress Notes (Signed)
Curd, AURY SCOLLARD ([id number]) Visit Report for 10/04/2017 Allergy List Details Patient Name: [name] [name], [name] [name] Date of Service: 10/04/2017 9:45 AM Medical Record Number: [id number] Patient Account Number: [id number] Date of Birth/Sex: [birth year] (62 y.o. Female) Treating RN: Carolyne Fiscal, Debi Primary Care Quinesha Selinger: Barbaraann Boys Other Clinician: Referring Shellsea Borunda: Barbaraann Boys Treating Karryn Kosinski/Extender: Ricard Dillon Weeks in Treatment: 0 Allergies Active Allergies Norvasc adhesive Allergy Notes Electronic Signature(s) Signed: 10/04/2017 3:54:16 PM By: Alric Quan Entered By: Alric Quan on 10/04/2017 10:12:36 Bulow, [name] ([id number]) -------------------------------------------------------------------------------- Arrival Information Details Patient Name: [name] [name] Date of Service: 10/04/2017 9:45 AM Medical Record Number: [id number] Patient Account Number: [id number] Date of Birth/Sex: 03-30-1956 (62 y.o. Female) Treating RN: Carolyne Fiscal, Debi Primary Care Sabrine Patchen: Barbaraann Boys Other Clinician: Referring Rajeev Escue: Barbaraann Boys Treating Jamie-Lee Galdamez/Extender: Tito Dine in Treatment: 0 Visit Information Patient Arrived: Cane Arrival Time: 10:03 Accompanied By: self Transfer Assistance: None Patient Identification Verified: Yes Secondary Verification Process Completed: Yes Patient Requires Transmission-Based No Precautions: Patient Has Alerts: Yes Patient Alerts: L Leg ABI >220 Electronic Signature(s) Signed: 10/04/2017 3:54:16 PM By: Alric Quan Entered By: Alric Quan on 10/04/2017 10:38:51 Achorn, [name] ([id number]) -------------------------------------------------------------------------------- Clinic Level of Care Assessment Details Patient Name: [name] [name] Date of Service: 10/04/2017 9:45 AM Medical Record Number: [id number] Patient Account Number: [id number] Date of Birth/Sex: Sep 29, 1955 (62 y.o.  Female) Treating RN: Cornell Barman Primary Care Najia Hurlbutt: Barbaraann Boys Other Clinician: Referring Marlys Stegmaier: Barbaraann Boys Treating Dj Senteno/Extender: Tito Dine in Treatment: 0 Clinic Level of Care Assessment Items TOOL 1 Quantity Score []  - Use when EandM and Procedure is performed on INITIAL visit 0 ASSESSMENTS - Nursing Assessment / Reassessment X - General Physical Exam (combine w/ comprehensive assessment (listed just below) when 1 20 performed on new pt. evals) X- 1 25 Comprehensive Assessment (HX, ROS, Risk Assessments, Wounds Hx, etc.) ASSESSMENTS - Wound and Skin Assessment / Reassessment []  - Dermatologic / Skin Assessment (not related to wound area) 0 ASSESSMENTS - Ostomy and/or Continence Assessment and Care []  - Incontinence Assessment and Management 0 []  - 0 Ostomy Care Assessment and Management (repouching, etc.) PROCESS - Coordination of Care X - Simple Patient / Family Education for ongoing care 1 15 []  - 0 Complex (extensive) Patient / Family Education for ongoing care X- 1 10 Staff obtains Programmer, systems, Records, Test Results / Process Orders []  - 0 Staff telephones HHA, Nursing Homes / Clarify orders / etc []  - 0 Routine Transfer to another Facility (non-emergent condition) []  - 0 Routine Hospital Admission (non-emergent condition) X- 1 15 New Admissions / Biomedical engineer / Ordering NPWT, Apligraf, etc. []  - 0 Emergency Hospital Admission (emergent condition) PROCESS - Special Needs []  - Pediatric / Minor Patient Management 0 []  - 0 Isolation Patient Management []  - 0 Hearing / Language / Visual special needs []  - 0 Assessment of Community assistance (transportation, D/C planning, etc.) []  - 0 Additional assistance / Altered mentation []  - 0 Support Surface(s) Assessment (bed, cushion, seat, etc.) Doleman, Stormey J. ([id number]) INTERVENTIONS - Miscellaneous []  - External ear exam 0 []  - 0 Patient Transfer (multiple staff / Librarian, academic / Similar devices) []  - 0 Simple Staple / Suture removal (25 or less) []  - 0 Complex Staple / Suture removal (26 or more) []  - 0 Hypo/Hyperglycemic Management (do not check if billed separately) X- 1 15 Ankle / Brachial Index (ABI) - do not check if billed separately Has the patient been seen at the hospital within the last  three years: Yes Total Score: 100 Level Of Care: New/Established - Level 3 Electronic Signature(s) Signed: 10/05/2017 8:47:48 AM By: Gretta Cool, BSN, RN, CWS, Kim RN, BSN Entered By: Gretta Cool, BSN, RN, CWS, Kim on 10/04/2017 11:05:27 Sheetz, [name] ([id number]) -------------------------------------------------------------------------------- Encounter Discharge Information Details Patient Name: [name] [name] Date of Service: 10/04/2017 9:45 AM Medical Record Number: [id number] Patient Account Number: [id number] Date of Birth/Sex: [birth year] (62 y.o. Female) Treating RN: Roger Shelter Primary Care Chandan Fly: Barbaraann Boys Other Clinician: Referring Estle Sabella: Barbaraann Boys Treating Benito Lemmerman/Extender: Tito Dine in Treatment: 0 Encounter Discharge Information Items Discharge Condition: Stable Ambulatory Status: Cane Discharge Destination: Home Transportation: Private Auto Schedule Follow-up Appointment: Yes Clinical Summary of Care: Electronic Signature(s) Signed: 10/04/2017 4:05:56 PM By: Roger Shelter Entered By: Roger Shelter on 10/04/2017 11:19:59 Palmateer, [name] ([id number]) -------------------------------------------------------------------------------- Lower Extremity Assessment Details Patient Name: [name] [name] Date of Service: 10/04/2017 9:45 AM Medical Record Number: [id number] Patient Account Number: [id number] Date of Birth/Sex: 01-05-1956 (62 y.o. Female) Treating RN: Carolyne Fiscal, Debi Primary Care Eliani Leclere: Barbaraann Boys Other Clinician: Referring Arayna Illescas: Barbaraann Boys Treating Analea Muller/Extender: Ricard Dillon Weeks in Treatment: 0 Edema Assessment Assessed: [Left: No] [Right: No] Edema: [Left: Yes] [Right: Yes] Calf Left: Right: Point of Measurement: 36 cm From Medial Instep 52.5 cm 54.5 cm Ankle Left: Right: Point of Measurement: 11 cm From Medial Instep 26.5 cm 25 cm Vascular Assessment Pulses: Dorsalis Pedis Palpable: [Left:Yes] [Right:Yes] Posterior Tibial Extremity colors, hair growth, and conditions: Extremity Color: [Left:Hyperpigmented] [Right:Hyperpigmented] Hair Growth on Extremity: [Left:Yes] [Right:Yes] Temperature of Extremity: [Left:Warm] Capillary Refill: [Left:< 3 seconds] [Right:< 3 seconds] Toe Nail Assessment Left: Right: Thick: Yes Yes Discolored: Yes Yes Deformed: Yes Yes Improper Length and Hygiene: Yes Yes Notes Left Leg ABI non-compressible pt cannot handle BP taken on her right lower leg so I was unable to do ABI Electronic Signature(s) Signed: 10/04/2017 3:54:16 PM By: Alric Quan Entered By: Alric Quan on 10/04/2017 10:42:11 Evon, [name] ([id number]) -------------------------------------------------------------------------------- Multi Wound Chart Details Patient Name: [name] [name] Date of Service: 10/04/2017 9:45 AM Medical Record Number: [id number] Patient Account Number: [id number] Date of Birth/Sex: 06-02-1955 (62 y.o. Female) Treating RN: Cornell Barman Primary Care Nahara Dona: Barbaraann Boys Other Clinician: Referring Domino Holten: Barbaraann Boys Treating Cortney Beissel/Extender: Tito Dine in Treatment: 0 Vital Signs Height(in): 69 Pulse(bpm): 54 Weight(lbs): 338 Blood Pressure(mmHg): 174/70 Body Mass Index(BMI): 50 Temperature(F): 97.73 Respiratory Rate 20 (breaths/min): Photos: [1:No Photos] [N/A:N/A] Wound Location: [1:Right Foot - Dorsal] [N/A:N/A] Wounding Event: [1:Blister] [N/A:N/A] Primary Etiology: [1:To be determined] [N/A:N/A] Comorbid History: [1:Anemia, Arrhythmia, Hypertension, Gout] [N/A:N/A] Date  Acquired: [1:09/20/2017] [N/A:N/A] Weeks of Treatment: [1:0] [N/A:N/A] Wound Status: [1:Open] [N/A:N/A] Measurements L x W x D [1:6.5x11.5x0.1] [N/A:N/A] (cm) Area (cm) : [1:58.709] [N/A:N/A] Volume (cm) : [1:5.871] [N/A:N/A] Classification: [1:Partial Thickness] [N/A:N/A] Exudate Amount: [1:None Present] [N/A:N/A] Wound Margin: [1:Distinct, outline attached] [N/A:N/A] Granulation Amount: [1:None Present (0%)] [N/A:N/A] Necrotic Amount: [1:Callaham (67-100%)] [N/A:N/A] Necrotic Tissue: [1:Eschar] [N/A:N/A] Exposed Structures: [1:Fascia: No Fat Layer (Subcutaneous Tissue) Exposed: No Tendon: No Muscle: No Joint: No Bone: No] [N/A:N/A] Epithelialization: [1:None] [N/A:N/A] Periwound Skin Texture: [1:No Abnormalities Noted] [N/A:N/A] Periwound Skin Moisture: [1:No Abnormalities Noted] [N/A:N/A] Periwound Skin Color: [1:No Abnormalities Noted] [N/A:N/A] Temperature: [1:No Abnormality] [N/A:N/A] Tenderness on Palpation: [1:Yes] [N/A:N/A] Wound Preparation: [1:Ulcer Cleansing: Rinsed/Irrigated with Saline] [N/A:N/A] Topical Anesthetic Applied: None Looper, Yola J. ([id number]) Procedures Performed: Incision and Drainage N/A N/A Treatment Notes Wound #1 (Right, Dorsal Foot) 1. Cleansed with: Clean wound with Normal Saline 2. Anesthetic Topical Lidocaine 4%  cream to wound bed prior to debridement 4. Dressing Applied: Other dressing (specify in notes) 5. Secondary Dressing Applied ABD Pad Notes sivercel, kerlix wrap with tape Electronic Signature(s) Signed: 10/04/2017 4:49:36 PM By: Linton Ham MD Entered By: Linton Ham on 10/04/2017 12:40:11 Carbary, [name] ([id number]) -------------------------------------------------------------------------------- Multi-Disciplinary Care Plan Details Patient Name: [name] [name] Date of Service: 10/04/2017 9:45 AM Medical Record Number: [id number] Patient Account Number: [id number] Date of Birth/Sex: 07-10-1955 (62 y.o.  Female) Treating RN: Cornell Barman Primary Care Latayna Ritchie: Barbaraann Boys Other Clinician: Referring Shareka Casale: Barbaraann Boys Treating Ellan Tess/Extender: Tito Dine in Treatment: 0 Active Inactive ` Orientation to the Wound Care Program Nursing Diagnoses: Knowledge deficit related to the wound healing center program Goals: Patient/caregiver will verbalize understanding of the [facility] Date Initiated: 10/04/2017 Target Resolution Date: 10/27/2017 Goal Status: Active Interventions: Provide education on orientation to the wound center Notes: ` Soft Tissue Infection Nursing Diagnoses: Impaired tissue integrity Goals: Patient will remain free of wound infection Date Initiated: 10/04/2017 Target Resolution Date: 10/27/2017 Goal Status: Active Interventions: Assess signs and symptoms of infection every visit Treatment Activities: Systemic antibiotics : 10/04/2017 Notes: ` Wound/Skin Impairment Nursing Diagnoses: Impaired tissue integrity Goals: Ulcer/skin breakdown will have a volume reduction of 80% by week 12 Date Initiated: 10/04/2017 Target Resolution Date: 01/04/2018 KELILAH, HEBARD ([id number]) Goal Status: Active Interventions: Assess ulceration(s) every visit Treatment Activities: Topical wound management initiated : 10/04/2017 Notes: Electronic Signature(s) Signed: 10/05/2017 8:47:48 AM By: Gretta Cool, BSN, RN, CWS, Kim RN, BSN Entered By: Gretta Cool, BSN, RN, CWS, Kim on 10/04/2017 10:54:31 Kofoed, [name] ([id number]) -------------------------------------------------------------------------------- Pain Assessment Details Patient Name: [name] [name] Date of Service: 10/04/2017 9:45 AM Medical Record Number: [id number] Patient Account Number: [id number] Date of Birth/Sex: [birth year] (62 y.o. Female) Treating RN: Carolyne Fiscal, Debi Primary Care Itsel Opfer: Barbaraann Boys Other Clinician: Referring Mikaiah Stoffer: Barbaraann Boys Treating Mena Lienau/Extender:  Ricard Dillon Weeks in Treatment: 0 Active Problems Location of Pain Severity and Description of Pain Patient Has Paino No Site Locations Pain Management and Medication Current Pain Management: Electronic Signature(s) Signed: 10/04/2017 3:54:16 PM By: Alric Quan Entered By: Alric Quan on 10/04/2017 10:04:22 Vandagriff, [name] ([id number]) -------------------------------------------------------------------------------- Patient/Caregiver Education Details Patient Name: [name] [name] Date of Service: 10/04/2017 9:45 AM Medical Record Number: [id number] Patient Account Number: [id number] Date of Birth/Gender: October 23, 1955 (62 y.o. Female) Treating RN: Roger Shelter Primary Care Physician: Barbaraann Boys Other Clinician: Referring Physician: Barbaraann Boys Treating Physician/Extender: Tito Dine in Treatment: 0 Education Assessment Education Provided To: Patient Education Topics Provided Welcome [facility]: Handouts: [facility] Methods: Explain/Verbal Responses: State content correctly Wound Debridement: Handouts: Wound Debridement Methods: Explain/Verbal Responses: State content correctly Wound/Skin Impairment: Handouts: Caring for Your Ulcer Methods: Explain/Verbal Responses: State content correctly Electronic Signature(s) Signed: 10/04/2017 4:05:56 PM By: Roger Shelter Entered By: Roger Shelter on 10/04/2017 11:20:32 Craigo, [name] ([id number]) -------------------------------------------------------------------------------- Wound Assessment Details Patient Name: [name] [name] Date of Service: 10/04/2017 9:45 AM Medical Record Number: [id number] Patient Account Number: [id number] Date of Birth/Sex: Feb 03, 1956 (62 y.o. Female) Treating RN: Carolyne Fiscal, Debi Primary Care Nataya Bastedo: Barbaraann Boys Other Clinician: Referring Nury Nebergall: Barbaraann Boys Treating Destyn Parfitt/Extender: Ricard Dillon Weeks in  Treatment: 0 Wound Status Wound Number: 1 Primary Etiology: Pressure Ulcer Wound Location: Right Foot - Dorsal Wound Status: Open Wounding Event: Blister Comorbid History: Anemia, Arrhythmia, Hypertension, Gout Date Acquired: 09/20/2017 Weeks Of Treatment: 0 Clustered Wound: No Photos Photo Uploaded By: Alric Quan on 10/04/2017 15:47:33  Wound Measurements Length: (cm) 6.5 Width: (cm) 11.5 Depth: (cm) 0.1 Area: (cm) 58.709 Volume: (cm) 5.871 % Reduction in Area: 0% % Reduction in Volume: 0% Epithelialization: None Tunneling: No Undermining: No Wound Description Classification: Category/Stage II Wound Margin: Distinct, outline attached Exudate Amount: Forst Exudate Type: Serous Exudate Color: amber Foul Odor After Cleansing: No Slough/Fibrino No Wound Bed Granulation Amount: Vazques (67-100%) Exposed Structure Necrotic Amount: None Present (0%) Fascia Exposed: No Fat Layer (Subcutaneous Tissue) Exposed: Yes Tendon Exposed: No Muscle Exposed: No Joint Exposed: No Bone Exposed: No Periwound Skin Texture Texture Color No Abnormalities Noted: No No Abnormalities Noted: No Rubor: Yes Moisture Nath, Heiley J. ([id number]) No Abnormalities Noted: No Temperature / Pain Maceration: Yes Temperature: No Abnormality Tenderness on Palpation: Yes Wound Preparation Ulcer Cleansing: Rinsed/Irrigated with Saline Topical Anesthetic Applied: None Treatment Notes Wound #1 (Right, Dorsal Foot) 1. Cleansed with: Clean wound with Normal Saline 2. Anesthetic Topical Lidocaine 4% cream to wound bed prior to debridement 4. Dressing Applied: Other dressing (specify in notes) 5. Secondary Dressing Applied ABD Pad Notes sivercel, kerlix wrap with tape Electronic Signature(s) Signed: 10/04/2017 1:14:10 PM By: Gretta Cool, BSN, RN, CWS, Kim RN, BSN Signed: 10/04/2017 3:54:16 PM By: Alric Quan Entered By: Gretta Cool BSN, RN, CWS, Kim on 10/04/2017 13:14:09 Russey, [name]  ([id number]) -------------------------------------------------------------------------------- Harvest Details Patient Name: [name] [name] Date of Service: 10/04/2017 9:45 AM Medical Record Number: [id number] Patient Account Number: [id number] Date of Birth/Sex: [birth year] (62 y.o. Female) Treating RN: Carolyne Fiscal, Debi Primary Care Laraya Pestka: Barbaraann Boys Other Clinician: Referring Arella Blinder: Barbaraann Boys Treating Haliey Romberg/Extender: Tito Dine in Treatment: 0 Vital Signs Time Taken: 10:05 Temperature (F): 97.73 Height (in): 69 Pulse (bpm): 54 Source: Stated Respiratory Rate (breaths/min): 20 Weight (lbs): 338 Blood Pressure (mmHg): 174/70 Source: Measured Reference Range: 80 - 120 mg / dl Body Mass Index (BMI): 49.9 Notes Made Dr. Dellia Nims aware of pts BP. Pt states that it is her first time here and she is scared of what is going to happen and she is anxious. Electronic Signature(s) Signed: 10/04/2017 3:54:16 PM By: Alric Quan Entered By: Alric Quan on 10/04/2017 10:10:52

## 2017-10-11 ENCOUNTER — Encounter: Payer: BLUE CROSS/BLUE SHIELD | Admitting: Internal Medicine

## 2017-10-11 DIAGNOSIS — L97511 Non-pressure chronic ulcer of other part of right foot limited to breakdown of skin: Secondary | ICD-10-CM | POA: Diagnosis not present

## 2017-10-13 NOTE — Progress Notes (Signed)
Pozo, KAENA SANTORI ([id number]) Visit Report for 10/04/2017 HPI Details Patient Name: [name] [name], [name] [name] Date of Service: 10/04/2017 9:45 AM Medical Record Number: [id number] Patient Account Number: [id number] Date of Birth/Sex: 01-Jan-1956 (62 y.o. F) Treating RN: Cornell Barman Primary Care Provider: Barbaraann Boys Other Clinician: Referring Provider: Barbaraann Boys Treating Provider/Extender: Tito Dine in Treatment: 0 History of Present Illness HPI Description: ADMISSION 10/04/17; this is a 62 year old nondiabetic woman who was in her usual state until 2 weeks ago. She says she was walking at work and noticed a rubbing sensation on her right great toe. She didn't really think too much about this however later on noted a significant [name] over right great toe. She was seen in the urgent care on 09/22/17 started on Keflex. She was later seen in her primary physician's office in 09/25/17 and started on doxycycline. She is continued to have expansion of the [name] over the top of her dorsal foot. Along the lines of the base of her toes and metastatic tarsals. She denies any pain, joint pain swelling stiffness or other systemic symptoms. She is not a diabetic but she is a prediabetic. 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 She does have a history of stage II chronic renal failure, gastroesophageal reflux disease, gout, heart murmur, endometrial cancer iron deficiency hypertension, hypothyroidism, obesity, "prediabetic" and skin cancer dating back to 2012 The patient is still taking doxycycline. We attempted to do ABIs in her clinic noncompressible on the left she did not tolerated on the right Electronic Signature(s) Signed: 10/04/2017 4:49:36 PM By: Linton Ham MD Entered By: Linton Ham on 10/04/2017 12:44:37 [name] [name], [name] [name] ([id number]) -------------------------------------------------------------------------------- Incision  and Drainage Details Patient Name: [name] [name] Date of Service: 10/04/2017 9:45 AM Medical Record Number: [id number] Patient Account Number: [id number] Date of Birth/Sex: 17-Feb-1956 (62 y.o. F) Treating RN: Cornell Barman Primary Care Provider: Barbaraann Boys Other Clinician: Referring Provider: Barbaraann Boys Treating Provider/Extender: Tito Dine in Treatment: 0 Incision And Drainage Wound #1 Right, Dorsal Foot Performed for: Performed By: Physician Ricard Dillon, MD Incision And Drainage Abscess Type: Location: dorsal foot Pre-procedure Yes - 11:01 Verification/Time Out Taken: Drainage Of: Sero-Sanguineous Instrument: Blade Bleeding: Minimum Hemostasis Achieved: Pressure Culture Sent: Swab Procedural Pain: 1 Post Procedural Pain: 1 Response to Treatment: Procedure was tolerated well Level of Consciousness: Awake and Alert Post Procedure Diagnosis Same as Pre-procedure Electronic Signature(s) Signed: 10/04/2017 4:49:36 PM By: Linton Ham MD Entered By: Linton Ham on 10/04/2017 12:53:16 [name] [name], [name] [name] ([id number]) -------------------------------------------------------------------------------- Physical Exam Details Patient Name: [name] [name] Date of Service: 10/04/2017 9:45 AM Medical Record Number: [id number] Patient Account Number: [id number] Date of Birth/Sex: 1956-01-23 (62 y.o. F) Treating RN: Cornell Barman Primary Care Provider: Barbaraann Boys Other Clinician: Referring Provider: Barbaraann Boys Treating Provider/Extender: Tito Dine in Treatment: 0 Constitutional Patient is hypertensive.. Pulse regular and within target range for patient.Marland Kitchen Respirations regular, non-labored and within target range.. Temperature is normal and within the target range for the patient.Marland Kitchen appears in no distress. Eyes Conjunctivae clear. No discharge. Cardiovascular heart sounds are normal I did not hear her heart murmur. JVP is not elevated.  femoral pulse palpable on the right. pedal pulses are palpable on the right. no pitting edema. No evidence of DVT. Lymphatic nonpalpable no popliteal or inguinal area on the right. Musculoskeletal no obvious joint issues in the MTPs on the right. Integumentary (Hair, Skin) skin on the lower legs was dark brown however  she tells me she had been out in the sun while on vacation in April.. no obvious primary skin issues were seen. Psychiatric somewhat anxious but nothing overtly abnormal. Notes wound exam; the area in question was on the dorsal right great toe and at the base of her toes across the dorsal metatarsals. Resnik flaccid [name]. Fluids somewhat serosanguineous looking although she says it was initially clear. There is no evidence of surrounding infection in the surrounding skin Electronic Signature(s) Signed: 10/04/2017 4:49:36 PM By: Linton Ham MD Entered By: Linton Ham on 10/04/2017 12:48:47 [name] [name], [name] [name] ([id number]) -------------------------------------------------------------------------------- Physician Orders Details Patient Name: [name] [name] Date of Service: 10/04/2017 9:45 AM Medical Record Number: [id number] Patient Account Number: [id number] Date of Birth/Sex: 1955-12-07 (62 y.o. F) Treating RN: Cornell Barman Primary Care Provider: Barbaraann Boys Other Clinician: Referring Provider: Barbaraann Boys Treating Provider/Extender: Tito Dine in Treatment: 0 Verbal / Phone Orders: No Diagnosis Coding Wound Cleansing Wound #1 Right,Dorsal Foot o Clean wound with Normal Saline. Primary Wound Dressing Wound #1 Right,Dorsal Foot o Silver Alginate Secondary Dressing Wound #1 Right,Dorsal Foot o ABD and Kerlix/Conform Dressing Change Frequency Wound #1 Right,Dorsal Foot o Change dressing every other day. Follow-up Appointments Wound #1 Right,Dorsal Foot o Return Appointment in 1 week. Edema Control Wound #1 Right,Dorsal  Foot o Elevate legs to the level of the heart and pump ankles as often as possible Off-Loading Wound #1 Right,Dorsal Foot o Open toe surgical shoe to: Laboratory o Bacteria identified in Wound by Culture (MICRO) - Right dorsal foot oooo LOINC Code: 7494-4 oooo Convenience Name: Wound culture routine Electronic Signature(s) Signed: 10/05/2017 8:47:48 AM By: Gretta Cool, BSN, RN, CWS, Kim RN, BSN Signed: 10/11/2017 4:07:49 PM By: Linton Ham MD Previous Signature: 10/04/2017 4:49:36 PM Version By: Linton Ham MD Entered By: Gretta Cool, BSN, RN, CWS, Kim on 10/05/2017 08:21:38 [name] [name], [name] [name] ([id number]) [name] [name], [name] [name] ([id number]) -------------------------------------------------------------------------------- Problem List Details Patient Name: [name] [name] Date of Service: 10/04/2017 9:45 AM Medical Record Number: [id number] Patient Account Number: [id number] Date of Birth/Sex: November 03, 1955 (62 y.o. F) Treating RN: Cornell Barman Primary Care Provider: Barbaraann Boys Other Clinician: Referring Provider: Barbaraann Boys Treating Provider/Extender: Tito Dine in Treatment: 0 Active Problems ICD-10 Impacting Encounter Code Description Active Date Wound Healing Diagnosis L97.511 Non-pressure chronic ulcer of other part of right foot limited to 10/04/2017 No Yes breakdown of skin L03.115 Cellulitis of right lower limb 10/04/2017 No Yes Inactive Problems Resolved Problems Electronic Signature(s) Signed: 10/04/2017 4:49:36 PM By: Linton Ham MD Entered By: Linton Ham on 10/04/2017 12:38:50 [name] [name], [name] [name] ([id number]) -------------------------------------------------------------------------------- Progress Note Details Patient Name: [name] [name] Date of Service: 10/04/2017 9:45 AM Medical Record Number: [id number] Patient Account Number: [id number] Date of Birth/Sex: [birth year] (62 y.o. F) Treating RN: Cornell Barman Primary Care Provider: Barbaraann Boys Other  Clinician: Referring Provider: Barbaraann Boys Treating Provider/Extender: Tito Dine in Treatment: 0 Subjective History of Present Illness (HPI) ADMISSION 10/04/17; this is a 62 year old nondiabetic woman who was in her usual state until 2 weeks ago. She says she was walking at work and noticed a rubbing sensation on her right great toe. She didn't really think too much about this however later on noted a significant [name] over right great toe. She was seen in the urgent care on 09/22/17 started on Keflex. She was later seen in her primary physician's office in 09/25/17 and started on doxycycline. She is continued to have expansion of the [name] over the top of her dorsal  foot. Along the lines of the base of her toes and metastatic tarsals. She denies any pain, joint pain swelling stiffness or other systemic symptoms. She is not a diabetic but she is a prediabetic. 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 She does have a history of stage II chronic renal failure, gastroesophageal reflux disease, gout, heart murmur, endometrial cancer iron deficiency hypertension, hypothyroidism, obesity, "prediabetic" and skin cancer dating back to 2012 The patient is still taking doxycycline. We attempted to do ABIs in her clinic noncompressible on the left she did not tolerated on the right Wound History Patient presents with 1 open wound that has been present for approximately 2 WEEKS. Patient has been treating wound in the following manner: neosporin, ABT. Laboratory tests have not been performed in the last month. Patient reportedly has not tested positive for an antibiotic resistant organism. Patient reportedly has not tested positive for osteomyelitis. Patient reportedly has not had testing performed to evaluate circulation in the legs. Patient experiences the following problems associated with their wounds: swelling. Patient  History Information obtained from Patient. Allergies Norvasc, adhesive Family History Cancer - Paternal Grandparents, Diabetes - Mother, Heart Disease - Father, Hypertension - Mother, Lung Disease - Paternal Grandparents, Stroke - Paternal Grandparents,Maternal Grandparents, No family history of Hereditary Spherocytosis, Kidney Disease, Seizures, Thyroid Problems, Tuberculosis. Social History Never smoker, Marital Status - Single, Alcohol Use - Never, Drug Use - No History, Caffeine Use - Daily. Medical History Hematologic/Lymphatic Patient has history of Anemia Cardiovascular Patient has history of Arrhythmia - a-fib, Hypertension Endocrine Denies history of Type I Diabetes, Type II Diabetes Admire, Amory J. ([id number]) Musculoskeletal Patient has history of Gout Oncologic Denies history of Received Chemotherapy, Received Radiation Review of Systems (ROS) Constitutional Symptoms (General Health) The patient has no complaints or symptoms. Eyes Complains or has symptoms of Glasses / Contacts. Ear/Nose/Mouth/Throat The patient has no complaints or symptoms. Respiratory The patient has no complaints or symptoms. Cardiovascular heart murmur ventral hypertrophy Gastrointestinal gerd Endocrine Complains or has symptoms of Thyroid disease. Genitourinary CKD stage II Endometriosis Immunological The patient has no complaints or symptoms. Integumentary (Skin) The patient has no complaints or symptoms. Neurologic The patient has no complaints or symptoms. Oncologic endometrial cancer surgery skin cancer surgery Objective Constitutional Patient is hypertensive.. Pulse regular and within target range for patient.Marland Kitchen Respirations regular, non-labored and within target range.. Temperature is normal and within the target range for the patient.Marland Kitchen appears in no distress. Vitals Time Taken: 10:05 AM, Height: 69 in, Source: Stated, Weight: 338 lbs, Source: Measured, BMI: 49.9,  Temperature: 97.73 F, Pulse: 54 bpm, Respiratory Rate: 20 breaths/min, Blood Pressure: 174/70 mmHg. General Notes: Made Dr. Dellia Nims aware of pts BP. Pt states that it is her first time here and she is scared of what is going to happen and she is anxious. Eyes Conjunctivae clear. No discharge. Cardiovascular heart sounds are normal I did not hear her heart murmur. JVP is not elevated. femoral pulse palpable on the right. pedal pulses are palpable on the right. no pitting edema. No evidence of DVT. Lymphatic nonpalpable no popliteal or inguinal area on the right. [name] [name], [name] ([id number]) Musculoskeletal no obvious joint issues in the MTPs on the right. Psychiatric somewhat anxious but nothing overtly abnormal. General Notes: wound exam; the area in question was on the dorsal right great toe and at the base of her toes across the dorsal metatarsals. Schul flaccid [name]. Fluids somewhat serosanguineous looking although she says it  was initially clear. There is no evidence of surrounding infection in the surrounding skin Integumentary (Hair, Skin) skin on the lower legs was dark brown however she tells me she had been out in the sun while on vacation in April.. no obvious primary skin issues were seen. Wound #1 status is Open. Original cause of wound was [name]. The wound is located on the Right,Dorsal Foot. The wound measures 6.5cm length x 11.5cm width x 0.1cm depth; 58.709cm^2 area and 5.871cm^3 volume. There is Fat Layer (Subcutaneous Tissue) Exposed exposed. There is no tunneling or undermining noted. There is a Stthomas amount of serous drainage noted. The wound margin is distinct with the outline attached to the wound base. There is Ortner (67-100%) granulation within the wound bed. There is no necrotic tissue within the wound bed. The periwound skin appearance exhibited: Maceration, Rubor. Periwound temperature was noted as No Abnormality. The periwound has tenderness  on palpation. Assessment Active Problems ICD-10 Non-pressure chronic ulcer of other part of right foot limited to breakdown of skin Cellulitis of right lower limb Procedures Wound #1 Pre-procedure diagnosis of Wound #1 is a To be determined located on the Right, Dorsal Foot . Abscess incision and drainage was provided by Ricard Dillon, MD. The skin was cleansed and prepped with anti-septic. An incision was made in the dorsal foot with the following instrument(s): Blade. There was an immediate release of Sero-Sanguineous fluid. A Minimum amount of bleeding was controlled with Pressure. A time out was conducted at 11:01, prior to the start of the procedure. Swab culture was sent. The procedure was tolerated well with a pain level of 1 throughout and a pain level of 1 following the procedure. Patient s Level of Consciousness post procedure was recorded as Awake and Alert. Post procedure Diagnosis Wound #1: Same as Pre-Procedure Plan [name] [name], [name] ([id number]) Wound Cleansing: Wound #1 Right,Dorsal Foot: Clean wound with Normal Saline. Primary Wound Dressing: Wound #1 Right,Dorsal Foot: Silver Alginate Secondary Dressing: Wound #1 Right,Dorsal Foot: ABD and Kerlix/Conform Dressing Change Frequency: Wound #1 Right,Dorsal Foot: Change dressing every other day. Follow-up Appointments: Wound #1 Right,Dorsal Foot: Return Appointment in 1 week. Edema Control: Wound #1 Right,Dorsal Foot: Elevate legs to the level of the heart and pump ankles as often as possible Off-Loading: Wound #1 Right,Dorsal Foot: Open toe surgical shoe to: Laboratory ordered were: Wound culture routine - Right dorsal foot #1 the exact cause of the blistering here isn't really clear. The lowest possible to get blistering from a cellulitis, the usual symptoms of cellulitis including the skin changes, pain to palpation etc. are usually more prominent in my experience in any case the [name] expanded after  she was treated with antibiotics and she is still now on doxycycline. Currently no evidence of significant cellulitis. #2 is quite possible that this all represents continued friction on the surface of her forefoot #3 this is a [name] [name]. The skin on the top of this is clearly not going to be viable. The [name] was opened fluid drained and cultured and then the overlying nonviable skin removed with pickups and a scalpel. #4 the base of the [name] [name] will be dressed with silver alginate/ABDs/Kerlix and Coban. We have advised using an open toed surgical shoe which we have provided. She is already out of work till June 28 which was done by her primary physician. #5 I am not expecting much trouble getting this to re-epithelialize. However I'm not completely sure of the etiology of this. If this was infectious  that should've been more painful at the start at least in my opinion. This all may be pressure/friction in etiology Electronic Signature(s) Signed: 10/05/2017 9:08:06 AM By: Gretta Cool, BSN, RN, CWS, Kim RN, BSN Signed: 10/11/2017 4:07:49 PM By: Linton Ham MD Previous Signature: 10/04/2017 4:49:36 PM Version By: Linton Ham MD Entered By: Gretta Cool, BSN, RN, CWS, Kim on 10/05/2017 09:08:06 [name] [name], [name] [name] ([id number]) -------------------------------------------------------------------------------- ROS/PFSH Details Patient Name: [name] [name] Date of Service: 10/04/2017 9:45 AM Medical Record Number: [id number] Patient Account Number: [id number] Date of Birth/Sex: February 19, 1956 (62 y.o. F) Treating RN: Ahmed Prima Primary Care Provider: Barbaraann Boys Other Clinician: Referring Provider: Barbaraann Boys Treating Provider/Extender: Tito Dine in Treatment: 0 Information Obtained From Patient Wound History Do you currently have one or more open woundso Yes How many open wounds do you currently haveo 1 Approximately how long have you had your woundso 2 WEEKS How  have you been treating your wound(s) until nowo neosporin, ABT Has your wound(s) ever healed and then re-openedo No Have you had any lab work done in the past montho No Have you tested positive for an antibiotic resistant organism (MRSA, VRE)o No Have you tested positive for osteomyelitis (bone infection)o No Have you had any tests for circulation on your legso No Have you had other problems associated with your woundso Swelling Eyes Complaints and Symptoms: Positive for: Glasses / Contacts Endocrine Complaints and Symptoms: Positive for: Thyroid disease Medical History: Negative for: Type I Diabetes; Type II Diabetes Constitutional Symptoms (General Health) Complaints and Symptoms: No Complaints or Symptoms Ear/Nose/Mouth/Throat Complaints and Symptoms: No Complaints or Symptoms Hematologic/Lymphatic Medical History: Positive for: Anemia Respiratory Complaints and Symptoms: No Complaints or Symptoms Cardiovascular Mesenbrink, [name] [name] J. ([id number]) Complaints and Symptoms: Review of System Notes: heart murmur ventral hypertrophy Medical History: Positive for: Arrhythmia - a-fib; Hypertension Gastrointestinal Complaints and Symptoms: Review of System Notes: gerd Genitourinary Complaints and Symptoms: Review of System Notes: CKD stage II Endometriosis Immunological Complaints and Symptoms: No Complaints or Symptoms Integumentary (Skin) Complaints and Symptoms: No Complaints or Symptoms Musculoskeletal Medical History: Positive for: Gout Neurologic Complaints and Symptoms: No Complaints or Symptoms Oncologic Complaints and Symptoms: Review of System Notes: endometrial cancer surgery skin cancer surgery Medical History: Negative for: Received Chemotherapy; Received Radiation Immunizations Pneumococcal Vaccine: Received Pneumococcal Vaccination: No Implantable Devices Family and Social History Abshier, MYKEL MOHL ([id number]) Cancer: Yes - Paternal  Grandparents; Diabetes: Yes - Mother; Heart Disease: Yes - Father; Hereditary Spherocytosis: No; Hypertension: Yes - Mother; Kidney Disease: No; Lung Disease: Yes - Paternal Grandparents; Seizures: No; Stroke: Yes - Paternal Grandparents,Maternal Grandparents; Thyroid Problems: No; Tuberculosis: No; Never smoker; Marital Status - Single; Alcohol Use: Never; Drug Use: No History; Caffeine Use: Daily; Financial Concerns: No; Food, Clothing or Shelter Needs: No; Support System Lacking: No; Transportation Concerns: No; Advanced Directives: No; Patient does not want information on Advanced Directives; Do not resuscitate: No; Living Will: No; Medical Power of Attorney: No Electronic Signature(s) Signed: 10/04/2017 3:54:16 PM By: Alric Quan Signed: 10/04/2017 4:49:36 PM By: Linton Ham MD Entered By: Alric Quan on 10/04/2017 10:26:45 Belsito, [name] [name] ([id number]) -------------------------------------------------------------------------------- [place] Details Patient Name: [name] [name] Date of Service: 10/04/2017 Medical Record Number: [id number] Patient Account Number: [id number] Date of Birth/Sex: 07/19/1955 (62 y.o. F) Treating RN: Cornell Barman Primary Care Provider: Barbaraann Boys Other Clinician: Referring Provider: Barbaraann Boys Treating Provider/Extender: Tito Dine in Treatment: 0 Diagnosis Coding ICD-10 Codes Code Description L97.511 Non-pressure chronic ulcer of other part of right foot limited to  breakdown of skin L03.115 Cellulitis of right lower limb Facility Procedures CPT4 Code Description: [id number] 99213 - WOUND CARE VISIT-LEV 3 EST PT Modifier: Quantity: 1 CPT4 Code Description: [id number] 10060 - I and D Abscess; simple/single ICD-10 Diagnosis Description L97.511 Non-pressure chronic ulcer of other part of right foot limited Modifier: to breakdown of Quantity: 1 skin Physician Procedures CPT4 Code Description: [id number] WC PHYS LEVEL 3 o NEW PT  ICD-10 Diagnosis Description L97.511 Non-pressure chronic ulcer of other part of right foot limite L03.115 Cellulitis of right lower limb Modifier: 25 d to breakdown of Quantity: 1 skin CPT4 Code Description: [id number] 74715 - I and D Abscess; simple/single ICD-10 Diagnosis Description L97.511 Non-pressure chronic ulcer of other part of right foot limite Modifier: d to breakdown of Quantity: 1 skin Electronic Signature(s) Signed: 10/04/2017 4:49:36 PM By: Linton Ham MD Previous Signature: 10/04/2017 12:56:40 PM Version By: Gretta Cool, BSN, RN, CWS, Kim RN, BSN Entered By: Linton Ham on 10/04/2017 12:56:55

## 2017-10-13 NOTE — Progress Notes (Signed)
Mcgraw, MILLENIA WALDVOGEL ([id number]) Visit Report for 10/11/2017 Debridement Details Patient Name: [name] [name], [name] [name] Date of Service: 10/11/2017 11:00 AM Medical Record Number: [id number] Patient Account Number: [id number] Date of Birth/Sex: 1955-08-31 (62 y.o. F) Treating RN: Cornell Barman Primary Care Provider: Barbaraann Boys Other Clinician: Referring Provider: Barbaraann Boys Treating Provider/Extender: Tito Dine in Treatment: 1 Debridement Performed for Wound #1 Right,Dorsal Foot Assessment: Performed By: Physician Ricard Dillon, MD Debridement Type: Debridement Pre-procedure Verification/Time Yes - 11:28 Out Taken: Start Time: 11:28 Pain Control: Other : lidocaine 4% Total Area Debrided (L x W): 1.5 (cm) x 2.5 (cm) = 3.75 (cm) Tissue and other material Viable, Non-Viable, Slough, Subcutaneous, Slough debrided: Level: Skin/Subcutaneous Tissue Debridement Description: Excisional Instrument: Curette Bleeding: Minimum Hemostasis Achieved: Pressure End Time: 11:30 Procedural Pain: 1 Post Procedural Pain: 1 Response to Treatment: Procedure was tolerated well Level of Consciousness: Awake and Alert Post Debridement Measurements of Total Wound Length: (cm) 1.5 Stage: Category/Stage II Width: (cm) 2.5 Depth: (cm) 0.1 Volume: (cm) 0.295 Character of Wound/Ulcer Post Stable Debridement: Post Procedure Diagnosis Same as Pre-procedure Electronic Signature(s) Signed: 10/11/2017 4:07:49 PM By: Linton Ham MD Signed: 10/11/2017 4:19:21 PM By: Gretta Cool, BSN, RN, CWS, Kim RN, BSN Entered By: Linton Ham on 10/11/2017 12:52:18 [name] [name], [name] [name] ([id number]) -------------------------------------------------------------------------------- HPI Details Patient Name: [name] [name] Date of Service: 10/11/2017 11:00 AM Medical Record Number: [id number] Patient Account Number: [id number] Date of Birth/Sex: April 29, 1955 (62 y.o. F) Treating RN: Cornell Barman Primary Care  Provider: Barbaraann Boys Other Clinician: Referring Provider: Barbaraann Boys Treating Provider/Extender: Tito Dine in Treatment: 1 History of Present Illness HPI Description: ADMISSION 10/04/17; this is a 62 year old nondiabetic woman who was in her usual state until 2 weeks ago. She says she was walking at work and noticed a rubbing sensation on her right great toe. She didn't really think too much about this however later on noted a significant blister over right great toe. She was seen in the urgent care on 09/22/17 started on Keflex. She was later seen in her primary physician's office in 09/25/17 and started on doxycycline. She is continued to have expansion of the blister over the top of her dorsal foot. Along the lines of the base of her toes and metastatic tarsals. She denies any pain, joint pain swelling stiffness or other systemic symptoms. She is not a diabetic but she is a prediabetic. 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 She does have a history of stage II chronic renal failure, gastroesophageal reflux disease, gout, heart murmur, endometrial cancer iron deficiency hypertension, hypothyroidism, obesity, "prediabetic" and skin cancer dating back to 2012 The patient is still taking doxycycline. We attempted to do ABIs in her clinic noncompressible on the left she did not tolerated on the right 10/11/17; patient who came in to clinic with a [place] flaccid blister over the dorsal part of her right foot just proximal to her toes but including the right great toe. Culture I did last week was negative. As the blister was flaccid not think any of the skin with adherence therefore the blister was opened. Been using silver alginate. The exact cause of this was not clear but it was probably footwear trauma rubbing in this setting of excessive sweat. The patient agrees largely with this there he Electronic Signature(s) Signed:  10/11/2017 4:07:49 PM By: Linton Ham MD Entered By: Linton Ham on 10/11/2017 12:58:07 [name] [name], [name] [name] ([id number]) -------------------------------------------------------------------------------- Physical Exam Details Patient Name: [name] [name],  [name] Date of Service: 10/11/2017 11:00 AM Medical Record Number: [id number] Patient Account Number: [id number] Date of Birth/Sex: [birth year] (62 y.o. F) Treating RN: Cornell Barman Primary Care Provider: Barbaraann Boys Other Clinician: Referring Provider: Barbaraann Boys Treating Provider/Extender: Ricard Dillon Weeks in Treatment: 1 Constitutional Sitting or standing Blood Pressure is within target range for patient.. Pulse regular and within target range for patient.Marland Kitchen Respirations regular, non-labored and within target range.. Temperature is normal and within the target range for the patient.Marland Kitchen appears in no distress. Cardiovascular palpable on the right. Notes wound exam; the area in question was on the dorsal right rate toe and the base of her toes across the dorsal metatarsals. All of this appears to be epithelializing except for the most lateral part. This was covered and necrotic debris. Using a #3 curet this was removed to a healthy looking granulated base. I think this is the only area of this [place] blister that is still open Electronic Signature(s) Signed: 10/11/2017 4:07:49 PM By: Linton Ham MD Entered By: Linton Ham on 10/11/2017 12:59:55 [name] [name], [name] [name] ([id number]) -------------------------------------------------------------------------------- Physician Orders Details Patient Name: [name] [name] Date of Service: 10/11/2017 11:00 AM Medical Record Number: [id number] Patient Account Number: [id number] Date of Birth/Sex: [birth year] (62 y.o. F) Treating RN: Cornell Barman Primary Care Provider: Barbaraann Boys Other Clinician: Referring Provider: Barbaraann Boys Treating Provider/Extender: Tito Dine in  Treatment: 1 Verbal / Phone Orders: No Diagnosis Coding Wound Cleansing Wound #1 Right,Dorsal Foot o Clean wound with Normal Saline. Anesthetic (add to Medication List) Wound #1 Right,Dorsal Foot o Topical Lidocaine 4% cream applied to wound bed prior to debridement (In Clinic Only). Primary Wound Dressing Wound #1 Right,Dorsal Foot o Silver Alginate Secondary Dressing Wound #1 Right,Dorsal Foot o ABD and Kerlix/Conform Dressing Change Frequency Wound #1 Right,Dorsal Foot o Change dressing every other day. Follow-up Appointments Wound #1 Right,Dorsal Foot o Return Appointment in 1 week. Edema Control Wound #1 Right,Dorsal Foot o Elevate legs to the level of the heart and pump ankles as often as possible Off-Loading Wound #1 Right,Dorsal Foot o Open toe surgical shoe to: Electronic Signature(s) Signed: 10/11/2017 4:07:49 PM By: Linton Ham MD Signed: 10/11/2017 4:19:21 PM By: Gretta Cool, BSN, RN, CWS, Kim RN, BSN Entered By: Gretta Cool, BSN, RN, CWS, Kim on 10/11/2017 11:28:14 [name] [name], [name] [name] ([id number]) -------------------------------------------------------------------------------- Problem List Details Patient Name: [name] [name] Date of Service: 10/11/2017 11:00 AM Medical Record Number: [id number] Patient Account Number: [id number] Date of Birth/Sex: [birth year] (62 y.o. 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 F) Treating RN: Cornell Barman Primary Care Provider: Barbaraann Boys Other Clinician: Referring Provider: Barbaraann Boys Treating Provider/Extender: Tito Dine in Treatment: 1 Subjective History of Present Illness (HPI) ADMISSION 10/04/17; this is a 62 year old nondiabetic woman who was in her usual state until 2 weeks ago. She says she was walking  at work and noticed a rubbing sensation on her right great toe. She didn't really think too much about this however later on noted a significant blister over right great toe. She was seen in the urgent care on 09/22/17 started on Keflex. She was later seen in her primary physician's office in 09/25/17 and started on doxycycline. She is continued to have expansion of the blister over the top of her dorsal foot. Along the lines of the base of her toes and metastatic tarsals. She denies any pain, joint pain swelling stiffness or other systemic symptoms. She is not a diabetic but she is a prediabetic. 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 She does have a history of stage II chronic renal failure, gastroesophageal reflux disease, gout, heart murmur, endometrial cancer iron deficiency hypertension, hypothyroidism, obesity, "prediabetic" and skin cancer dating back to 2012 The patient is still taking doxycycline. We attempted to do ABIs in her clinic noncompressible on the left she did not tolerated on the right 10/11/17; patient who came in to clinic with a [place] flaccid blister over the dorsal part of her right foot just proximal to her toes but including the right great  toe. Culture I did last week was negative. As the blister was flaccid not think any of the skin with adherence therefore the blister was opened. Been using silver alginate. The exact cause of this was not clear but it was probably footwear trauma rubbing in this setting of excessive sweat. The patient agrees largely with this there he Objective Constitutional Sitting or standing Blood Pressure is within target range for patient.. Pulse regular and within target range for patient.Marland Kitchen Respirations regular, non-labored and within target range.. Temperature is normal and within the target range for the patient.Marland Kitchen appears in no distress. Vitals Time Taken: 11:01 AM, Height: 69 in, Weight: 338 lbs, BMI: 49.9, Temperature: 97.8 F, Pulse: 64 bpm, Respiratory Rate: 20 breaths/min, Blood Pressure: 173/78 mmHg. Cardiovascular palpable on the right. General Notes: wound exam; the area in question was on the dorsal right rate toe and the base of her toes across the dorsal metatarsals. All of this appears to be epithelializing except for the most lateral part. This was covered and necrotic debris. Using a #3 curet this was removed to a healthy looking granulated base. I think this is the only area of this [place] blister that is [name] [name], [name] ([id number]) still open Integumentary (Hair, Skin) Wound #1 status is Open. Original cause of wound was Blister. The wound is located on the Right,Dorsal Foot. The wound measures 1.5cm length x 2.5cm width x 0.1cm depth; 2.945cm^2 area and 0.295cm^3 volume. There is Fat Layer (Subcutaneous Tissue) Exposed exposed. There is no tunneling or undermining noted. There is a Lacross amount of sanguinous drainage noted. The wound margin is distinct with the outline attached to the wound base. There is small (1-33%) pink granulation within the wound bed. There is a Deiss (67-100%) amount of necrotic tissue within the wound bed including Eschar. The periwound skin appearance  exhibited: Maceration, Rubor. Periwound temperature was noted as No Abnormality. The periwound has tenderness on palpation. Assessment Active Problems ICD-10 Non-pressure chronic ulcer of other part of right foot limited to breakdown of skin Cellulitis of right lower limb Procedures Wound #1 Pre-procedure diagnosis of Wound #1 is a Pressure Ulcer located on the Right,Dorsal Foot . There was a Excisional Skin/Subcutaneous Tissue Debridement with a total area of 3.75 sq cm performed by ROBSON,  MICHAEL G, MD. With the following instrument(s): Curette to remove Viable and Non-Viable tissue/material. Material removed includes Subcutaneous Tissue and Slough and after achieving pain control using Other (lidocaine 4%). No specimens were taken. A time out was conducted at 11:28, prior to the start of the procedure. A Minimum amount of bleeding was controlled with Pressure. The procedure was tolerated well with a pain level of 1 throughout and a pain level of 1 following the procedure. Patient s Level of Consciousness post procedure was recorded as Awake and Alert. Post Debridement Measurements: 1.5cm length x 2.5cm width x 0.1cm depth; 0.295cm^3 volume. Post debridement Stage noted as Category/Stage II. Character of Wound/Ulcer Post Debridement is stable. Post procedure Diagnosis Wound #1: Same as Pre-Procedure Plan Wound Cleansing: Wound #1 Right,Dorsal Foot: Clean wound with Normal Saline. Anesthetic (add to Medication List): Wound #1 Right,Dorsal Foot: Topical Lidocaine 4% cream applied to wound bed prior to debridement (In Clinic Only). Primary Wound Dressing: Wound #1 Right,Dorsal Foot: Silver Alginate Secondary Dressing: [name] [name], [name] [name] ([id number]) Wound #1 Right,Dorsal Foot: ABD and Kerlix/Conform Dressing Change Frequency: Wound #1 Right,Dorsal Foot: Change dressing every other day. Follow-up Appointments: Wound #1 Right,Dorsal Foot: Return Appointment in 1 week. Edema  Control: Wound #1 Right,Dorsal Foot: Elevate legs to the level of the heart and pump ankles as often as possible Off-Loading: Wound #1 Right,Dorsal Foot: Open toe surgical shoe to: #1 right dorsal foot. Most of this area is epithelialized only a small area laterally required debridement. This is superficial and healthy looking. #2 continue the silver alginate/ABDs/Kerlix/conform Electronic Signature(s) Signed: 10/11/2017 4:07:49 PM By: Linton Ham MD Entered By: Linton Ham on 10/11/2017 13:04:21 [name] [name], [name] [name] ([id number]) -------------------------------------------------------------------------------- [place] Details Patient Name: [name] [name] Date of Service: 10/11/2017 Medical Record Number: [id number] Patient Account Number: [id number] Date of Birth/Sex: 1955-11-16 (62 y.o. F) Treating RN: Cornell Barman Primary Care Provider: Barbaraann Boys Other Clinician: Referring Provider: Barbaraann Boys Treating Provider/Extender: Tito Dine in Treatment: 1 Diagnosis Coding ICD-10 Codes Code Description L97.511 Non-pressure chronic ulcer of other part of right foot limited to breakdown of skin L03.115 Cellulitis of right lower limb Facility Procedures CPT4 Code Description: [id number] 11042 - DEB SUBQ TISSUE 20 SQ CM/< ICD-10 Diagnosis Description L97.511 Non-pressure chronic ulcer of other part of right foot limited Modifier: to breakdown of Quantity: 1 skin Physician Procedures CPT4 Code Description: [id number] - WC PHYS SUBQ TISS 20 SQ CM ICD-10 Diagnosis Description L97.511 Non-pressure chronic ulcer of other part of right foot limited Modifier: to breakdown of Quantity: 1 skin Electronic Signature(s) Signed: 10/11/2017 4:07:49 PM By: Linton Ham MD Entered By: Linton Ham on 10/11/2017 13:04:40

## 2017-10-14 NOTE — Progress Notes (Signed)
Worton, CORDELL COKE ([id number]) Visit Report for 10/11/2017 Arrival Information Details Patient Name: [name] [name], [name] [name] Date of Service: 10/11/2017 11:00 AM Medical Record Number: [id number] Patient Account Number: [id number] Date of Birth/Sex: 10-11-1955 (62 y.o. F) Treating RN: Ahmed Prima Primary Care Carley Glendenning: Barbaraann Boys Other Clinician: Referring Laythan Hayter: Barbaraann Boys Treating Lessie Funderburke/Extender: Tito Dine in Treatment: 1 Visit Information History Since Last Visit All ordered tests and consults were completed: No Patient Arrived: Cane Added or deleted any medications: No Arrival Time: 10:59 Any new allergies or adverse reactions: No Accompanied By: self Had a fall or experienced change in No Transfer Assistance: None activities of daily living that may affect Patient Identification Verified: Yes risk of falls: Secondary Verification Process Completed: Yes Signs or symptoms of abuse/neglect since last visito No Patient Requires Transmission-Based No Hospitalized since last visit: No Precautions: Implantable device outside of the clinic excluding No Patient Has Alerts: Yes cellular tissue based products placed in the center Patient Alerts: L Leg ABI since last visit: >220 Has Dressing in Place as Prescribed: Yes Pain Present Now: No Electronic Signature(s) Signed: 10/12/2017 4:29:22 PM By: Alric Quan Entered By: Alric Quan on 10/11/2017 11:00:16 Armor, [name] ([id number]) -------------------------------------------------------------------------------- Encounter Discharge Information Details Patient Name: [name] [name] Date of Service: 10/11/2017 11:00 AM Medical Record Number: [id number] Patient Account Number: [id number] Date of Birth/Sex: 1955-08-07 (62 y.o. F) Treating RN: Cornell Barman Primary Care Jlee Harkless: Barbaraann Boys Other Clinician: Referring Cerra Eisenhower: Barbaraann Boys Treating Merlinda Wrubel/Extender: Tito Dine in  Treatment: 1 Encounter Discharge Information Items Discharge Condition: Stable Ambulatory Status: Cane Discharge Destination: Home Transportation: Private Auto Schedule Follow-up Appointment: Yes Clinical Summary of Care: Electronic Signature(s) Signed: 10/11/2017 4:19:21 PM By: Gretta Cool, BSN, RN, CWS, Kim RN, BSN Entered By: Gretta Cool, BSN, RN, CWS, Kim on 10/11/2017 11:44:44 Davilla, [name] ([id number]) -------------------------------------------------------------------------------- Lower Extremity Assessment Details Patient Name: [name] [name] Date of Service: 10/11/2017 11:00 AM Medical Record Number: [id number] Patient Account Number: [id number] Date of Birth/Sex: 01/14/1956 (62 y.o. F) Treating RN: Ahmed Prima Primary Care Rosemary Pentecost: Barbaraann Boys Other Clinician: Referring Pama Roskos: Barbaraann Boys Treating Shakim Faith/Extender: Tito Dine in Treatment: 1 Edema Assessment Assessed: [Left: No] [Right: No] [Left: Edema] [Right: :] Calf Left: Right: Point of Measurement: 36 cm From Medial Instep cm 53.5 cm Ankle Left: Right: Point of Measurement: 11 cm From Medial Instep cm 27.4 cm Vascular Assessment Pulses: Dorsalis Pedis Palpable: [Right:Yes] Posterior Tibial Extremity colors, hair growth, and conditions: Extremity Color: [Right:Hyperpigmented] Temperature of Extremity: [Right:Warm] Capillary Refill: [Right:< 3 seconds] Toe Nail Assessment Left: Right: Thick: Yes Discolored: No Improper Length and Hygiene: No Electronic Signature(s) Signed: 10/12/2017 4:29:22 PM By: Alric Quan Entered By: Alric Quan on 10/11/2017 11:09:00 Cando, [name] ([id number]) -------------------------------------------------------------------------------- Multi Wound Chart Details Patient Name: [name] [name] Date of Service: 10/11/2017 11:00 AM Medical Record Number: [id number] Patient Account Number: [id number] Date of Birth/Sex: Dec 13, 1955 (62 y.o.  F) Treating RN: Cornell Barman Primary Care Nikoli Nasser: Barbaraann Boys Other Clinician: Referring Taggart Prasad: Barbaraann Boys Treating Colsen Modi/Extender: Tito Dine in Treatment: 1 Vital Signs Height(in): 69 Pulse(bpm): 64 Weight(lbs): 338 Blood Pressure(mmHg): 173/78 Body Mass Index(BMI): 50 Temperature(F): 97.8 Respiratory Rate 20 (breaths/min): Photos: [1:No Photos] [N/A:N/A] Wound Location: [1:Right Foot - Dorsal] [N/A:N/A] Wounding Event: [1:Blister] [N/A:N/A] Primary Etiology: [1:Pressure Ulcer] [N/A:N/A] Comorbid History: [1:Anemia, Arrhythmia, Hypertension, Gout] [N/A:N/A] Date Acquired: [1:09/20/2017] [N/A:N/A] Weeks of Treatment: [1:1] [N/A:N/A] Wound Status: [1:Open] [N/A:N/A] Measurements L x W x D [1:1.5x2.5x0.1] [N/A:N/A] (cm) Area (cm) : [1:2.945] [N/A:N/A]  Volume (cm) : [1:0.295] [N/A:N/A] % Reduction in Area: [1:95.00%] [N/A:N/A] % Reduction in Volume: [1:95.00%] [N/A:N/A] Classification: [1:Category/Stage II] [N/A:N/A] Exudate Amount: [1:Whipkey] [N/A:N/A] Exudate Type: [1:Sanguinous] [N/A:N/A] Exudate Color: [1:red] [N/A:N/A] Wound Margin: [1:Distinct, outline attached] [N/A:N/A] Granulation Amount: [1:Small (1-33%)] [N/A:N/A] Granulation Quality: [1:Pink] [N/A:N/A] Necrotic Amount: [1:Kinnard (67-100%)] [N/A:N/A] Necrotic Tissue: [1:Eschar] [N/A:N/A] Exposed Structures: [1:Fat Layer (Subcutaneous Tissue) Exposed: Yes Fascia: No Tendon: No Muscle: No Joint: No Bone: No] [N/A:N/A] Epithelialization: [1:None] [N/A:N/A] Debridement: [1:Debridement - Excisional] [N/A:N/A] Pre-procedure [1:11:28] [N/A:N/A] Verification/Time Out Taken: Pain Control: [1:Other] [N/A:N/A] Tissue Debrided: [1:Subcutaneous, Slough] [N/A:N/A] Level: Skin/Subcutaneous Tissue N/A N/A Debridement Area (sq cm): 3.75 N/A N/A Instrument: Curette N/A N/A Bleeding: Minimum N/A N/A Hemostasis Achieved: Pressure N/A N/A Procedural Pain: 1 N/A N/A Post Procedural Pain: 1 N/A  N/A Debridement Treatment Procedure was tolerated well N/A N/A Response: Post Debridement 1.5x2.5x0.1 N/A N/A Measurements L x W x D (cm) Post Debridement Volume: 0.295 N/A N/A (cm) Post Debridement Stage: Category/Stage II N/A N/A Periwound Skin Texture: No Abnormalities Noted N/A N/A Periwound Skin Moisture: Maceration: Yes N/A N/A Periwound Skin Color: Rubor: Yes N/A N/A Temperature: No Abnormality N/A N/A Tenderness on Palpation: Yes N/A N/A Wound Preparation: Ulcer Cleansing: N/A N/A Rinsed/Irrigated with Saline Topical Anesthetic Applied: Other: lidocaine 4% Procedures Performed: Debridement N/A N/A Treatment Notes Wound #1 (Right, Dorsal Foot) 1. Cleansed with: Clean wound with Normal Saline 2. Anesthetic Topical Lidocaine 4% cream to wound bed prior to debridement 4. Dressing Applied: Other dressing (specify in notes) 5. Secondary Dressing Applied ABD Pad Kerlix/Conform Notes sivercel, kerlix wrap with tape, darko off loading shoe Electronic Signature(s) Signed: 10/11/2017 4:07:49 PM By: Linton Ham MD Entered By: Linton Ham on 10/11/2017 12:51:47 Givler, [name] ([id number]) -------------------------------------------------------------------------------- Multi-Disciplinary Care Plan Details Patient Name: [name] [name] Date of Service: 10/11/2017 11:00 AM Medical Record Number: [id number] Patient Account Number: [id number] Date of Birth/Sex: 03/27/1956 (62 y.o. F) Treating RN: Cornell Barman Primary Care Kita Neace: Barbaraann Boys Other Clinician: Referring Danasha Melman: Barbaraann Boys Treating Sachit Gilman/Extender: Tito Dine in Treatment: 1 Active Inactive ` Orientation to the Wound Care Program Nursing Diagnoses: Knowledge deficit related to the wound healing center program Goals: Patient/caregiver will verbalize understanding of the [facility] Program Date Initiated: 10/04/2017 Target Resolution Date: 10/27/2017 Goal Status:  Active Interventions: Provide education on orientation to the wound center Notes: ` Soft Tissue Infection Nursing Diagnoses: Impaired tissue integrity Goals: Patient will remain free of wound infection Date Initiated: 10/04/2017 Target Resolution Date: 10/27/2017 Goal Status: Active Interventions: Assess signs and symptoms of infection every visit Treatment Activities: Systemic antibiotics : 10/04/2017 Notes: ` Wound/Skin Impairment Nursing Diagnoses: Impaired tissue integrity Goals: Ulcer/skin breakdown will have a volume reduction of 80% by week 12 Date Initiated: 10/04/2017 Target Resolution Date: 01/04/2018 WYLODEAN, SHIMMEL ([id number]) Goal Status: Active Interventions: Assess ulceration(s) every visit Treatment Activities: Topical wound management initiated : 10/04/2017 Notes: Electronic Signature(s) Signed: 10/11/2017 4:19:21 PM By: Gretta Cool, BSN, RN, CWS, Kim RN, BSN Entered By: Gretta Cool, BSN, RN, CWS, Kim on 10/11/2017 11:25:53 Cruey, [name] ([id number]) -------------------------------------------------------------------------------- Pain Assessment Details Patient Name: [name] [name] Date of Service: 10/11/2017 11:00 AM Medical Record Number: [id number] Patient Account Number: [id number] Date of Birth/Sex: 12-15-1955 (62 y.o. F) Treating RN: Ahmed Prima Primary Care Zackarie Chason: Barbaraann Boys Other Clinician: Referring Landrie Beale: Barbaraann Boys Treating Annmarie Plemmons/Extender: Tito Dine in Treatment: 1 Active Problems Location of Pain Severity and Description of Pain Patient Has Paino No Site Locations Pain Management and Medication Current Pain  Management: Electronic Signature(s) Signed: 10/12/2017 4:29:22 PM By: Alric Quan Entered By: Alric Quan on 10/11/2017 11:00:22 Demorest, [name] ([id number]) -------------------------------------------------------------------------------- Patient/Caregiver Education Details Patient Name: [name] [name] Date of Service: 10/11/2017 11:00 AM Medical Record Number: [id number] Patient Account Number: [id number] Date of Birth/Gender: May 02, 1955 (62 y.o. F) Treating RN: Cornell Barman Primary Care Physician: Barbaraann Boys Other Clinician: Referring Physician: Barbaraann Boys Treating Physician/Extender: Tito Dine in Treatment: 1 Education Assessment Education Provided To: Patient Education Topics Provided Wound/Skin Impairment: Handouts: Caring for Your Ulcer Methods: Explain/Verbal Responses: State content correctly Electronic Signature(s) Signed: 10/11/2017 4:19:21 PM By: Gretta Cool, BSN, RN, CWS, Kim RN, BSN Entered By: Gretta Cool, BSN, RN, CWS, Kim on 10/11/2017 11:44:58 Rossa, [name] ([id number]) -------------------------------------------------------------------------------- Wound Assessment Details Patient Name: [name] [name] Date of Service: 10/11/2017 11:00 AM Medical Record Number: [id number] Patient Account Number: [id number] Date of Birth/Sex: Oct 31, 1955 (62 y.o. F) Treating RN: Ahmed Prima Primary Care Jylan Loeza: Barbaraann Boys Other Clinician: Referring Leidy Massar: Barbaraann Boys Treating Donetta Isaza/Extender: Ricard Dillon Weeks in Treatment: 1 Wound Status Wound Number: 1 Primary Etiology: Pressure Ulcer Wound Location: Right Foot - Dorsal Wound Status: Open Wounding Event: Blister Comorbid History: Anemia, Arrhythmia, Hypertension, Gout Date Acquired: 09/20/2017 Weeks Of Treatment: 1 Clustered Wound: No Photos Photo Uploaded By: Alric Quan on 10/12/2017 16:36:07 Wound Measurements Length: (cm) 1.5 Width: (cm) 2.5 Depth: (cm) 0.1 Area: (cm) 2.945 Volume: (cm) 0.295 % Reduction in Area: 95% % Reduction in Volume: 95% Epithelialization: None Tunneling: No Undermining: No Wound Description Classification: Category/Stage II Wound Margin: Distinct, outline attached Exudate Amount: Chuong Exudate Type: Sanguinous Exudate Color:  red Foul Odor After Cleansing: No Slough/Fibrino No Wound Bed Granulation Amount: Small (1-33%) Exposed Structure Granulation Quality: Pink Fascia Exposed: No Necrotic Amount: Kitchings (67-100%) Fat Layer (Subcutaneous Tissue) Exposed: Yes Necrotic Quality: Eschar Tendon Exposed: No Muscle Exposed: No Joint Exposed: No Bone Exposed: No Periwound Skin Texture Texture Color No Abnormalities Noted: No No Abnormalities Noted: No Rubor: Yes Moisture Forehand, [place] J. ([id number]) No Abnormalities Noted: No Temperature / Pain Maceration: Yes Temperature: No Abnormality Tenderness on Palpation: Yes Wound Preparation Ulcer Cleansing: Rinsed/Irrigated with Saline Topical Anesthetic Applied: Other: lidocaine 4%, Treatment Notes Wound #1 (Right, Dorsal Foot) 1. Cleansed with: Clean wound with Normal Saline 2. Anesthetic Topical Lidocaine 4% cream to wound bed prior to debridement 4. Dressing Applied: Other dressing (specify in notes) 5. Secondary Dressing Applied ABD Pad Kerlix/Conform Notes sivercel, kerlix wrap with tape, darko off loading shoe Electronic Signature(s) Signed: 10/12/2017 4:29:22 PM By: Alric Quan Entered By: Alric Quan on 10/11/2017 11:07:15 Dadisman, [name] [name]) -------------------------------------------------------------------------------- Miller Details Patient Name: [name] [name] Date of Service: 10/11/2017 11:00 AM Medical Record Number: [id number] Patient Account Number: [id number] Date of Birth/Sex: 02-Feb-1956 (62 y.o. F) Treating RN: Ahmed Prima Primary Care Rasha Ibe: Barbaraann Boys Other Clinician: Referring Horris Speros: Barbaraann Boys Treating Jaid Quirion/Extender: Tito Dine in Treatment: 1 Vital Signs Time Taken: 11:01 Temperature (F): 97.8 Height (in): 69 Pulse (bpm): 64 Weight (lbs): 338 Respiratory Rate (breaths/min): 20 Body Mass Index (BMI): 49.9 Blood Pressure (mmHg): 173/78 Reference Range: 80 -  120 mg / dl Electronic Signature(s) Signed: 10/12/2017 4:29:22 PM By: Alric Quan Entered By: Alric Quan on 10/11/2017 11:06:42

## 2017-10-18 ENCOUNTER — Encounter: Payer: BLUE CROSS/BLUE SHIELD | Admitting: Internal Medicine

## 2017-10-18 DIAGNOSIS — L97511 Non-pressure chronic ulcer of other part of right foot limited to breakdown of skin: Secondary | ICD-10-CM | POA: Diagnosis not present

## 2017-10-20 NOTE — Progress Notes (Signed)
[name], [name] ([id number]) Visit Report for 10/18/2017 Debridement Details Patient Name: [name] [name], [name] [name] Date of Service: 10/18/2017 11:00 AM Medical Record Number: [id number] Patient Account Number: [id number] Date of Birth/Sex: October 22, 1955 (62 y.o. F) Treating RN: Cornell Barman Primary Care Provider: Barbaraann Boys Other Clinician: Referring Provider: Barbaraann Boys Treating Provider/Extender: Tito Dine in Treatment: 2 Debridement Performed for Wound #1 Right,Dorsal Foot Assessment: Performed By: Physician Ricard Dillon, MD Debridement Type: Debridement Pre-procedure Verification/Time Yes - 11:20 Out Taken: Start Time: 11:20 Pain Control: Other : lidocaine 4% Total Area Debrided (L x W): 1 (cm) x 1 (cm) = 1 (cm) Tissue and other material Non-Viable, Skin: Dermis debrided: Level: Skin/Dermis Debridement Description: Selective/Open Wound Instrument: Forceps, Scissors Bleeding: None End Time: 11:25 Procedural Pain: 0 Post Procedural Pain: 0 Response to Treatment: Procedure was tolerated well Level of Consciousness: Awake and Alert Post Debridement Measurements of Total Wound Length: (cm) 1 Stage: Category/Stage II Width: (cm) 1 Depth: (cm) 0.1 Volume: (cm) 0.079 Character of Wound/Ulcer Post Improved Debridement: Post Procedure Diagnosis Same as Pre-procedure Electronic Signature(s) Signed: 10/18/2017 5:44:56 PM By: Gretta Cool, BSN, RN, CWS, Kim RN, BSN Signed: 10/18/2017 6:35:58 PM By: Linton Ham MD Entered By: Linton Ham on 10/18/2017 12:27:46 [name] [name], [name] [name] ([id number]) -------------------------------------------------------------------------------- HPI Details Patient Name: [name] [name] Date of Service: 10/18/2017 11:00 AM Medical Record Number: [id number] Patient Account Number: [id number] Date of Birth/Sex: 1955-06-29 (62 y.o. F) Treating RN: Cornell Barman Primary Care Provider: Barbaraann Boys Other Clinician: Referring Provider:  Barbaraann Boys Treating Provider/Extender: Tito Dine in Treatment: 2 History of Present Illness HPI Description: ADMISSION 10/04/17; this is a 62 year old nondiabetic woman who was in her usual state until 2 weeks ago. She says she was walking at work and noticed a rubbing sensation on her right great toe. She didn't really think too much about this however later on noted a significant blister over right great toe. She was seen in the urgent care on 09/22/17 started on Keflex. She was later seen in her primary physician's office in 09/25/17 and started on doxycycline. She is continued to have expansion of the blister over the top of her dorsal foot. Along the lines of the base of her toes and metastatic tarsals. She denies any pain, joint pain swelling stiffness or other systemic symptoms. She is not a diabetic but she is a prediabetic. 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 She does have a history of stage II chronic renal failure, gastroesophageal reflux disease, gout, heart murmur, endometrial cancer iron deficiency hypertension, hypothyroidism, obesity, "prediabetic" and skin cancer dating back to 2012 The patient is still taking doxycycline. We attempted to do ABIs in her clinic noncompressible on the left she did not tolerated on the right 10/11/17; patient who came in to clinic with a Heinke flaccid blister over the dorsal part of her right foot just proximal to her toes but including the right great toe. Culture I did last week was negative. As the blister was flaccid not think any of the skin with adherence therefore the blister was opened. Been using silver alginate. The exact cause of this was not clear but it was probably footwear trauma rubbing in this setting of excessive sweat. The patient agrees largely with this there he 10/18/17; still some denuded skin around the wound circumference. This appears to be contracting and  epithelializing nicely. Still using silver alginate. Electronic Signature(s) Signed: 10/18/2017 6:35:58 PM By: Linton Ham MD Entered  By: Linton Ham on 10/18/2017 12:28:40 [name] [name], [name] [name] ([id number]) -------------------------------------------------------------------------------- Physical Exam Details Patient Name: [name] [name], [name] [name] Date of Service: 10/18/2017 11:00 AM Medical Record Number: [id number] Patient Account Number: [id number] Date of Birth/Sex: 06/29/1955 (62 y.o. F) Treating RN: Cornell Barman Primary Care Provider: Barbaraann Boys Other Clinician: Referring Provider: Barbaraann Boys Treating Provider/Extender: Tito Dine in Treatment: 2 Constitutional Patient is hypertensive.. Pulse regular and within target range for patient.Marland Kitchen Respirations regular, non-labored and within target range.. Temperature is normal and within the target range for the patient.Marland Kitchen appears in no distress. Notes wound exam; area in question is on the dorsal right toe in the base of her toes across the dorsal metatarsals. A lot of this is epithelialized. Using pickups and scissors I removed some devitalized skin from around the wound circumference. There is no evidence of ongoing infection Electronic Signature(s) Signed: 10/18/2017 6:35:58 PM By: Linton Ham MD Entered By: Linton Ham on 10/18/2017 12:29:40 [name] [name], [name] [name] ([id number]) -------------------------------------------------------------------------------- Physician Orders Details Patient Name: [name] [name] Date of Service: 10/18/2017 11:00 AM Medical Record Number: [id number] Patient Account Number: [id number] Date of Birth/Sex: 25-Jun-1955 (62 y.o. F) Treating RN: Cornell Barman Primary Care Provider: Barbaraann Boys Other Clinician: Referring Provider: Barbaraann Boys Treating Provider/Extender: Tito Dine in Treatment: 2 Verbal / Phone Orders: No Diagnosis Coding Wound Cleansing Wound #1 Right,Dorsal  Foot o Clean wound with Normal Saline. Anesthetic (add to Medication List) Wound #1 Right,Dorsal Foot o Topical Lidocaine 4% cream applied to wound bed prior to debridement (In Clinic Only). Primary Wound Dressing Wound #1 Right,Dorsal Foot o Silver Alginate Secondary Dressing Wound #1 Right,Dorsal Foot o ABD and Kerlix/Conform Dressing Change Frequency Wound #1 Right,Dorsal Foot o Change dressing every other day. Follow-up Appointments Wound #1 Right,Dorsal Foot o Return Appointment in 1 week. Edema Control Wound #1 Right,Dorsal Foot o Elevate legs to the level of the heart and pump ankles as often as possible Off-Loading Wound #1 Right,Dorsal Foot o Open toe surgical shoe to: Electronic Signature(s) Signed: 10/18/2017 5:44:56 PM By: Gretta Cool, BSN, RN, CWS, Kim RN, BSN Signed: 10/18/2017 6:35:58 PM By: Linton Ham MD Entered By: Gretta Cool, BSN, RN, CWS, Kim on 10/18/2017 11:24:48 [name] [name], [name] [name] ([id number]) -------------------------------------------------------------------------------- Problem List Details Patient Name: [name] [name] Date of Service: 10/18/2017 11:00 AM Medical Record Number: [id number] Patient Account Number: [id number] Date of Birth/Sex: 1955-10-20 (62 y.o. 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 Decision Aggressive Making : epithelialization. Using silver alginate L03.115 Cellulitis of right lower limb 10/04/2017 Yes Yes Status Complications Interventions Medical Controlled no evidence of  active infection no antibiotics are Decision necessary Making : Inactive Problems Resolved Problems Electronic Signature(s) Signed: 10/18/2017 6:35:58 PM By: Linton Ham MD Entered By: Linton Ham on 10/18/2017 12:26:37 [name] [name], [name] [name] ([id number]) -------------------------------------------------------------------------------- Progress Note Details Patient Name: [name] [name] Date of Service: 10/18/2017 11:00 AM Medical Record Number: [id number] Patient Account Number: [id number] Date of Birth/Sex: 07-Dec-1955 (62 y.o. F) Treating RN: Cornell Barman Primary Care Provider: Barbaraann Boys Other Clinician: Referring Provider: Barbaraann Boys Treating Provider/Extender: Tito Dine in Treatment: 2 Subjective History of Present Illness (HPI) ADMISSION 10/04/17; this is  a 62 year old nondiabetic woman who was in her usual state until 2 weeks ago. She says she was walking at work and noticed a rubbing sensation on her right great toe. She didn't really think too much about this however later on noted a significant blister over right great toe. She was seen in the urgent care on 09/22/17 started on Keflex. She was later seen in her primary physician's office in 09/25/17 and started on doxycycline. She is continued to have expansion of the blister over the top of her dorsal foot. Along the lines of the base of her toes and metastatic tarsals. She denies any pain, joint pain swelling stiffness or other systemic symptoms. She is not a diabetic but she is a prediabetic. 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 She does have a history of stage II chronic renal failure, gastroesophageal reflux disease, gout, heart murmur, endometrial cancer iron deficiency hypertension, hypothyroidism, obesity, "prediabetic" and skin cancer dating back to 2012 The patient is still taking doxycycline. We attempted to do ABIs in her clinic noncompressible  on the left she did not tolerated on the right 10/11/17; patient who came in to clinic with a [place] flaccid blister over the dorsal part of her right foot just proximal to her toes but including the right great toe. Culture I did last week was negative. As the blister was flaccid not think any of the skin with adherence therefore the blister was opened. Been using silver alginate. The exact cause of this was not clear but it was probably footwear trauma rubbing in this setting of excessive sweat. The patient agrees largely with this there he 10/18/17; still some denuded skin around the wound circumference. This appears to be contracting and epithelializing nicely. Still using silver alginate. Objective Constitutional Patient is hypertensive.. Pulse regular and within target range for patient.Marland Kitchen Respirations regular, non-labored and within target range.. Temperature is normal and within the target range for the patient.Marland Kitchen appears in no distress. Vitals Time Taken: 10:56 AM, Height: 69 in, Weight: 338 lbs, BMI: 49.9, Temperature: 97.8 F, Pulse: 69 bpm, Respiratory Rate: 18 breaths/min, Blood Pressure: 159/71 mmHg. General Notes: wound exam; area in question is on the dorsal right toe in the base of her toes across the dorsal metatarsals. A lot of this is epithelialized. Using pickups and scissors I removed some devitalized skin from around the wound circumference. There is no evidence of ongoing infection Integumentary (Hair, Skin) [name] [name], [name] ([id number]) Wound #1 status is Open. Original cause of wound was Blister. The wound is located on the Right,Dorsal Foot. The wound measures 1cm length x 2.5cm width x 0.1cm depth; 1.963cm^2 area and 0.196cm^3 volume. There is Fat Layer (Subcutaneous Tissue) Exposed exposed. There is no tunneling or undermining noted. There is a medium amount of serous drainage noted. The wound margin is distinct with the outline attached to the wound base. There is  Rutt (67-100%) red, pink granulation within the wound bed. There is a small (1-33%) amount of necrotic tissue within the wound bed including Eschar. The periwound skin appearance exhibited: Maceration, Rubor. Periwound temperature was noted as No Abnormality. The periwound has tenderness on palpation. Assessment Active Problems ICD-10 Non-pressure chronic ulcer of other part of right foot limited to breakdown of skin Cellulitis of right lower limb Procedures Wound #1 Pre-procedure diagnosis of Wound #1 is a Pressure Ulcer located on the Right,Dorsal Foot . There was a Selective/Open Wound Skin/Dermis Debridement with a total area of  1 sq cm performed by Ricard Dillon, MD. With the following instrument(s): Forceps, and Scissors to remove Non-Viable tissue/material. Material removed includes Skin: Dermis after achieving pain control using Other (lidocaine 4%). No specimens were taken. A time out was conducted at 11:20, prior to the start of the procedure. There was no bleeding. The procedure was tolerated well with a pain level of 0 throughout and a pain level of 0 following the procedure. Patient s Level of Consciousness post procedure was recorded as Awake and Alert. Post Debridement Measurements: 1cm length x 1cm width x 0.1cm depth; 0.079cm^3 volume. Post debridement Stage noted as Category/Stage II. Character of Wound/Ulcer Post Debridement is improved. Post procedure Diagnosis Wound #1: Same as Pre-Procedure Plan Wound Cleansing: Wound #1 Right,Dorsal Foot: Clean wound with Normal Saline. Anesthetic (add to Medication List): Wound #1 Right,Dorsal Foot: Topical Lidocaine 4% cream applied to wound bed prior to debridement (In Clinic Only). Primary Wound Dressing: Wound #1 Right,Dorsal Foot: Silver Alginate Secondary Dressing: Wound #1 Right,Dorsal Foot: ABD and Kerlix/Conform Dressing Change Frequency: [name] [name], [name] ([id number]) Wound #1 Right,Dorsal Foot: Change  dressing every other day. Follow-up Appointments: Wound #1 Right,Dorsal Foot: Return Appointment in 1 week. Edema Control: Wound #1 Right,Dorsal Foot: Elevate legs to the level of the heart and pump ankles as often as possible Off-Loading: Wound #1 Right,Dorsal Foot: Open toe surgical shoe to: Medical Decision Making Non-pressure chronic ulcer of other part of right foot limited to breakdown of skin 10/04/2017 Status: Improving Complications: N/a Interventions: the patient's wound appears to be improving. Aggressive epithelialization. Using silver alginate Cellulitis of right lower limb 10/04/2017 Status: Controlled Complications: no evidence of active infection Interventions: no antibiotics are necessary #1 continue silver alginate/ABDs/Kerlix and constant form. She appears to be making good progress. 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 Her initial one month short-term disability runs out next week. This was completed by her primary doctor. I think she would probably need a open toed/no rubbing type of footwear like a surgical shoe to go back at work I've asked her to contact her manager there about this Electronic Signature(s) Signed: 10/18/2017 6:35:58 PM By: Linton Ham MD Entered By: Linton Ham on 10/18/2017 12:31:11 [name] [name], [name] [name] ([id number]) -------------------------------------------------------------------------------- [place] Details Patient Name: [name] [name] Date of Service: 10/18/2017 Medical Record Number: [id number] Patient Account Number: [id number] Date of Birth/Sex: 05-11-1955 (62 y.o. F) Treating RN: Cornell Barman Primary Care Provider: Barbaraann Boys Other Clinician: Referring Provider: Barbaraann Boys Treating Provider/Extender: Tito Dine in Treatment: 2 Diagnosis Coding ICD-10 Codes Code Description L97.511 Non-pressure chronic ulcer of other part of right foot  limited to breakdown of skin L03.115 Cellulitis of right lower limb Facility Procedures CPT4 Code Description: [id number] - DEBRIDE WOUND 1ST 20 SQ CM OR < ICD-10 Diagnosis Description L97.511 Non-pressure chronic ulcer of other part of right foot limited t L03.115 Cellulitis of right lower limb Modifier: o breakdown of Quantity: 1 skin Physician Procedures CPT4 Code Description: [id number] - WC PHYS DEBR WO ANESTH 20 SQ CM ICD-10 Diagnosis Description L97.511 Non-pressure chronic ulcer of other part of right foot limited t L03.115 Cellulitis of right lower limb Modifier: o breakdown of Quantity: 1 skin Electronic Signature(s) Signed: 10/18/2017 6:35:58 PM By: Linton Ham MD Entered By: Linton Ham on 10/18/2017 12:31:34

## 2017-10-20 NOTE — Progress Notes (Signed)
[name] [name] ([id number]) Visit Report for 10/18/2017 Arrival Information Details Patient Name: [name] [name], [name] [name] Date of Service: 10/18/2017 11:00 AM Medical Record Number: [id number] Patient Account Number: [id number] Date of Birth/Sex: [birth year] (62 y.o. F) Treating RN: Roger Shelter Primary Care [name] [name]: Barbaraann Boys Other Clinician: Referring [name] [name]: Barbaraann Boys Treating Keyon Liller/Extender: Tito Dine in Treatment: 2 Visit Information History Since Last Visit All ordered tests and consults were completed: No Patient Arrived: Ambulatory Added or deleted any medications: No Arrival Time: 10:55 Any new allergies or adverse reactions: No Accompanied By: self Had a fall or experienced change in No Transfer Assistance: None activities of daily living that may affect Patient Identification Verified: Yes risk of falls: Secondary Verification Process Completed: Yes Signs or symptoms of abuse/neglect since last visito No Patient Requires Transmission-Based No Hospitalized since last visit: No Precautions: Implantable device outside of the clinic excluding No Patient Has Alerts: Yes cellular tissue based products placed in the center Patient Alerts: L Leg ABI since last visit: >220 Pain Present Now: No Electronic Signature(s) Signed: 10/18/2017 5:20:09 PM By: Roger Shelter Entered By: Roger Shelter on 10/18/2017 10:56:01 [name], [name] ([id number]) -------------------------------------------------------------------------------- Lower Extremity Assessment Details Patient Name: [name] [name] Date of Service: 10/18/2017 11:00 AM Medical Record Number: [id number] Patient Account Number: [id number] Date of Birth/Sex: 08-Apr-1956 (62 y.o. F) Treating RN: Roger Shelter Primary Care [name] [name]: Barbaraann Boys Other Clinician: Referring [name] [name]: Barbaraann Boys Treating Deleon Passe/Extender: Tito Dine in Treatment: 2 Edema  Assessment Assessed: [Left: No] [Right: No] Edema: [Left: Ye] [Right: s] Calf Left: Right: Point of Measurement: 36 cm From Medial Instep cm 53.5 cm Ankle Left: Right: Point of Measurement: 11 cm From Medial Instep cm 28 cm Vascular Assessment Claudication: Claudication Assessment [Right:None] Pulses: Dorsalis Pedis Palpable: [Right:Yes] Posterior Tibial Extremity colors, hair growth, and conditions: Extremity Color: [Right:Normal] Hair Growth on Extremity: [Right:Yes] Temperature of Extremity: [Right:Warm] Capillary Refill: [Right:< 3 seconds] Toe Nail Assessment Left: Right: Thick: No Discolored: No Deformed: No Improper Length and Hygiene: No Electronic Signature(s) Signed: 10/18/2017 5:20:09 PM By: Roger Shelter Entered By: Roger Shelter on 10/18/2017 11:05:13 [name], [name] ([id number]) -------------------------------------------------------------------------------- Multi Wound Chart Details Patient Name: [name] [name] Date of Service: 10/18/2017 11:00 AM Medical Record Number: [id number] Patient Account Number: [id number] Date of Birth/Sex: 1956/03/14 (62 y.o. F) Treating RN: Cornell Barman Primary Care [name] [name]: Barbaraann Boys Other Clinician: Referring [name] [name]: Barbaraann Boys Treating [name] [name]/Extender: Tito Dine in Treatment: 2 Vital Signs Height(in): 69 Pulse(bpm): 69 Weight(lbs): 338 Blood Pressure(mmHg): 159/71 Body Mass Index(BMI): 50 Temperature(F): 97.8 Respiratory Rate 18 (breaths/min): Photos: [N/A:N/A] Wound Location: Right Foot - Dorsal N/A N/A Wounding Event: Blister N/A N/A Primary Etiology: Pressure Ulcer N/A N/A Comorbid History: Anemia, Arrhythmia, N/A N/A Hypertension, Gout Date Acquired: 09/20/2017 N/A N/A Weeks of Treatment: 2 N/A N/A Wound Status: Open N/A N/A Measurements L x W x D 1x2.5x0.1 N/A N/A (cm) Area (cm) : 1.963 N/A N/A Volume (cm) : 0.196 N/A N/A % Reduction in Area: 96.70% N/A N/A %  Reduction in Volume: 96.70% N/A N/A Classification: Category/Stage II N/A N/A Exudate Amount: Medium N/A N/A Exudate Type: Serous N/A N/A Exudate Color: amber N/A N/A Wound Margin: Distinct, outline attached N/A N/A Granulation Amount: [name] (67-100%) N/A N/A Granulation Quality: Red, Pink N/A N/A Necrotic Amount: Small (1-33%) N/A N/A Necrotic Tissue: Eschar N/A N/A Exposed Structures: Fat Layer (Subcutaneous N/A N/A Tissue) Exposed: Yes Fascia: No Tendon: No Muscle: No [name], [name] ([id number]) Joint: No Bone: No Epithelialization:  None N/A N/A Debridement: Debridement - Selective/Open N/A N/A Wound Pre-procedure 11:20 N/A N/A Verification/Time Out Taken: Pain Control: Other N/A N/A Level: Skin/Dermis N/A N/A Debridement Area (sq cm): 1 N/A N/A Instrument: Forceps, Scissors N/A N/A Bleeding: None N/A N/A Procedural Pain: 0 N/A N/A Post Procedural Pain: 0 N/A N/A Debridement Treatment Procedure was tolerated well N/A N/A Response: Post Debridement 1x1x0.1 N/A N/A Measurements L x W x D (cm) Post Debridement Volume: 0.079 N/A N/A (cm) Post Debridement Stage: Category/Stage II N/A N/A Periwound Skin Texture: No Abnormalities Noted N/A N/A Periwound Skin Moisture: Maceration: Yes N/A N/A Periwound Skin Color: Rubor: Yes N/A N/A Temperature: No Abnormality N/A N/A Tenderness on Palpation: Yes N/A N/A Wound Preparation: Ulcer Cleansing: N/A N/A Rinsed/Irrigated with Saline Topical Anesthetic Applied: Other: lidocaine 4% Procedures Performed: Debridement N/A N/A Treatment Notes Electronic Signature(s) Signed: 10/18/2017 6:35:58 PM By: Linton Ham MD Entered By: Linton Ham on 10/18/2017 12:26:51 [name], [name] ([id number]) -------------------------------------------------------------------------------- Multi-Disciplinary Care Plan Details Patient Name: [name] [name] Date of Service: 10/18/2017 11:00 AM Medical Record Number: [id number] Patient  Account Number: [id number] Date of Birth/Sex: 1955-05-31 (62 y.o. F) Treating RN: Cornell Barman Primary Care Dayanne Yiu: Barbaraann Boys Other Clinician: Referring Karlos Scadden: Barbaraann Boys Treating Leyah Bocchino/Extender: Tito Dine in Treatment: 2 Active Inactive ` Orientation to the Wound Care Program Nursing Diagnoses: Knowledge deficit related to the wound healing center program Goals: Patient/caregiver will verbalize understanding of the [facility] Date Initiated: 10/04/2017 Target Resolution Date: 10/27/2017 Goal Status: Active Interventions: Provide education on orientation to the wound center Notes: ` Soft Tissue Infection Nursing Diagnoses: Impaired tissue integrity Goals: Patient will remain free of wound infection Date Initiated: 10/04/2017 Target Resolution Date: 10/27/2017 Goal Status: Active Interventions: Assess signs and symptoms of infection every visit Treatment Activities: Systemic antibiotics : 10/04/2017 Notes: ` Wound/Skin Impairment Nursing Diagnoses: Impaired tissue integrity Goals: Ulcer/skin breakdown will have a volume reduction of 80% by week 12 Date Initiated: 10/04/2017 Target Resolution Date: 01/04/2018 [name] [name], [name] [name] ([id number]) Goal Status: Active Interventions: Assess ulceration(s) every visit Treatment Activities: Topical wound management initiated : 10/04/2017 Notes: Electronic Signature(s) Signed: 10/18/2017 5:44:56 PM By: Gretta Cool, BSN, RN, CWS, Kim RN, BSN Entered By: Gretta Cool, BSN, RN, CWS, Kim on 10/18/2017 11:21:55 [name], [name] ([id number]) -------------------------------------------------------------------------------- Pain Assessment Details Patient Name: [name] [name] Date of Service: 10/18/2017 11:00 AM Medical Record Number: [id number] Patient Account Number: [id number] Date of Birth/Sex: November 07, 1955 (62 y.o. F) Treating RN: Roger Shelter Primary Care Coner Gibbard: Barbaraann Boys Other  Clinician: Referring Gustabo Gordillo: Barbaraann Boys Treating Aaidyn San/Extender: Tito Dine in Treatment: 2 Active Problems Location of Pain Severity and Description of Pain Patient Has Paino No Site Locations Pain Management and Medication Current Pain Management: Electronic Signature(s) Signed: 10/18/2017 5:20:09 PM By: Roger Shelter Entered By: Roger Shelter on 10/18/2017 10:56:08 [name], [name] ([id number]) -------------------------------------------------------------------------------- Wound Assessment Details Patient Name: [name] [name] Date of Service: 10/18/2017 11:00 AM Medical Record Number: [id number] Patient Account Number: [id number] Date of Birth/Sex: 1956/03/09 (62 y.o. F) Treating RN: Roger Shelter Primary Care Rashad Auld: Barbaraann Boys Other Clinician: Referring Jernie Schutt: Barbaraann Boys Treating Alper Guilmette/Extender: Ricard Dillon Weeks in Treatment: 2 Wound Status Wound Number: 1 Primary Etiology: Pressure Ulcer Wound Location: Right Foot - Dorsal Wound Status: Open Wounding Event: Blister Comorbid History: Anemia, Arrhythmia, Hypertension, Gout Date Acquired: 09/20/2017 Weeks Of Treatment: 2 Clustered Wound: No Photos Photo Uploaded By: Roger Shelter on 10/18/2017 12:01:11 Wound Measurements Length: (cm) 1 Width: (cm) 2.5 Depth: (cm) 0.1  Area: (cm) 1.963 Volume: (cm) 0.196 % Reduction in Area: 96.7% % Reduction in Volume: 96.7% Epithelialization: None Tunneling: No Undermining: No Wound Description Classification: Category/Stage II Wound Margin: Distinct, outline attached Exudate Amount: Medium Exudate Type: Serous Exudate Color: amber Foul Odor After Cleansing: No Slough/Fibrino No Wound Bed Granulation Amount: Losh (67-100%) Exposed Structure Granulation Quality: Red, Pink Fascia Exposed: No Necrotic Amount: Small (1-33%) Fat Layer (Subcutaneous Tissue) Exposed: Yes Necrotic Quality: Eschar Tendon Exposed:  No Muscle Exposed: No Joint Exposed: No Bone Exposed: No Periwound Skin Texture [name], [name] ([id number]) Texture Color No Abnormalities Noted: No No Abnormalities Noted: No Rubor: Yes Moisture No Abnormalities Noted: No Temperature / Pain Maceration: Yes Temperature: No Abnormality Tenderness on Palpation: Yes Wound Preparation Ulcer Cleansing: Rinsed/Irrigated with Saline Topical Anesthetic Applied: Other: lidocaine 4%, Electronic Signature(s) Signed: 10/18/2017 5:20:09 PM By: Roger Shelter Entered By: Roger Shelter on 10/18/2017 11:02:58 [name], [name] ([id number]) -------------------------------------------------------------------------------- Vitals Details Patient Name: [name] [name] Date of Service: 10/18/2017 11:00 AM Medical Record Number: [id number] Patient Account Number: [id number] Date of Birth/Sex: 11-Oct-1955 (62 y.o. F) Treating RN: Roger Shelter Primary Care Ranulfo Kall: Barbaraann Boys Other Clinician: Referring Jakyria Bleau: Barbaraann Boys Treating Leonid Manus/Extender: Tito Dine in Treatment: 2 Vital Signs Time Taken: 10:56 Temperature (F): 97.8 Height (in): 69 Pulse (bpm): 69 Weight (lbs): 338 Respiratory Rate (breaths/min): 18 Body Mass Index (BMI): 49.9 Blood Pressure (mmHg): 159/71 Reference Range: 80 - 120 mg / dl Electronic Signature(s) Signed: 10/18/2017 5:20:09 PM By: Roger Shelter Entered By: Roger Shelter on 10/18/2017 10:57:31

## 2017-10-25 ENCOUNTER — Encounter: Payer: BLUE CROSS/BLUE SHIELD | Attending: Internal Medicine | Admitting: Internal Medicine

## 2017-10-25 DIAGNOSIS — Z872 Personal history of diseases of the skin and subcutaneous tissue: Secondary | ICD-10-CM | POA: Diagnosis not present

## 2017-10-25 DIAGNOSIS — I10 Essential (primary) hypertension: Secondary | ICD-10-CM | POA: Diagnosis not present

## 2017-10-25 DIAGNOSIS — Z09 Encounter for follow-up examination after completed treatment for conditions other than malignant neoplasm: Secondary | ICD-10-CM | POA: Diagnosis not present

## 2017-10-27 NOTE — Progress Notes (Signed)
[name] [name] ([id number]) Visit Report for 10/25/2017 HPI Details Patient Name: [name] [name], [name] [name] Date of Service: 10/25/2017 11:15 AM Medical Record Number: [id number] Patient Account Number: [id number] Date of Birth/Sex: 05/22/1955 (62 y.o. F) Treating RN: Cornell Barman Primary Care Provider: Barbaraann Boys Other Clinician: Referring Provider: Barbaraann Boys Treating Provider/Extender: Tito Dine in Treatment: 3 History of Present Illness HPI Description: ADMISSION 10/04/17; this is a 62 year old nondiabetic woman who was in her usual state until 2 weeks ago. She says she was walking at work and noticed a rubbing sensation on her right great toe. She didn't really think too much about this however later on noted a significant blister over right great toe. She was seen in the urgent care on 09/22/17 started on Keflex. She was later seen in her primary physician's office in 09/25/17 and started on doxycycline. She is continued to have expansion of the blister over the top of her dorsal foot. Along the lines of the base of her toes and metastatic tarsals. She denies any pain, joint pain swelling stiffness or other systemic symptoms. She is not a diabetic but she is a prediabetic. 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 She does have a history of stage II chronic renal failure, gastroesophageal reflux disease, gout, heart murmur, endometrial cancer iron deficiency hypertension, hypothyroidism, obesity, "prediabetic" and skin cancer dating back to 2012 The patient is still taking doxycycline. We attempted to do ABIs in her clinic noncompressible on the left she did not tolerated on the right 10/11/17; patient who came in to clinic with a Rettke flaccid blister over the dorsal part of her right foot just proximal to her toes but including the right great toe. Culture I did last week was negative. As the blister was flaccid not think any of  the skin with adherence therefore the blister was opened. Been using silver alginate. The exact cause of this was not clear but it was probably footwear trauma rubbing in this setting of excessive sweat. The patient agrees largely with this there he 10/18/17; still some denuded skin around the wound circumference. This appears to be contracting and epithelializing nicely. Still using silver alginate. 10/25/17; the patient's areas fully epithelialized. New epithelium somewhat vulnerable. We've advised to keep this covered and perhaps continuing the surgical shoe to avoid friction and her footwear. This may have all been made pressures/friction area in her footwear. There may have been coexistent cellulitis as she was initially started on an antibiotic. When she came here she had a [place] flaccid blister which clearly needed to be excised. She has done well Electronic Signature(s) Signed: 10/27/2017 8:34:07 AM By: Linton Ham MD Entered By: Linton Ham on 10/25/2017 15:46:57 [name], [name] ([id number]) -------------------------------------------------------------------------------- Physical Exam Details Patient Name: [name] [name] Date of Service: 10/25/2017 11:15 AM Medical Record Number: [id number] Patient Account Number: [id number] Date of Birth/Sex: 1955/11/16 (62 y.o. F) Treating RN: Cornell Barman Primary Care Provider: Barbaraann Boys Other Clinician: Referring Provider: Barbaraann Boys Treating Provider/Extender: Tito Dine in Treatment: 3 Constitutional Patient is hypertensive.. Pulse regular and within target range for patient.Marland Kitchen Respirations regular, non-labored and within target range.. Temperature is normal and within the target range for the patient.Marland Kitchen appears in no distress. Notes wound exam; hearing questions on the dorsal right foot across her dorsal metatarsals. All of this is epithelialized. There was some flaking debris over the lateral part but underneath his  there is no open wound. No evidence  of infection. Electronic Signature(s) Signed: 10/27/2017 8:34:07 AM By: Linton Ham MD Entered By: Linton Ham on 10/25/2017 15:47:54 [name], [name] ([id number]) -------------------------------------------------------------------------------- Physician Orders Details Patient Name: [name] [name] Date of Service: 10/25/2017 11:15 AM Medical Record Number: [id number] Patient Account Number: [id number] Date of Birth/Sex: 04/17/1956 (62 y.o. F) Treating RN: Cornell Barman Primary Care Provider: Barbaraann Boys Other Clinician: Referring Provider: Barbaraann Boys Treating Provider/Extender: Tito Dine in Treatment: 3 Verbal / Phone Orders: No Diagnosis Coding Discharge From [facility] Wound #1 Right,Dorsal Foot o Discharge from [place] complete Electronic Signature(s) Signed: 10/25/2017 4:43:24 PM By: Gretta Cool, BSN, RN, CWS, Kim RN, BSN Signed: 10/27/2017 8:34:07 AM By: Linton Ham MD Entered By: Gretta Cool, BSN, RN, CWS, Kim on 10/25/2017 11:50:24 [name], [name] ([id number]) -------------------------------------------------------------------------------- Problem List Details Patient Name: [name] [name] Date of Service: 10/25/2017 11:15 AM Medical Record Number: [id number] Patient Account Number: [id number] Date of Birth/Sex: [birth year] (62 y.o. 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 Decision Aggressive Making : epithelialization. Using silver alginate L03.115  Cellulitis of right lower limb 10/04/2017 Yes Yes Status Complications Interventions Medical Controlled no evidence of active infection no antibiotics are Decision necessary Making : Inactive Problems Resolved Problems Electronic Signature(s) Signed: 10/27/2017 8:34:07 AM By: Linton Ham MD Entered By: Linton Ham on 10/25/2017 15:44:46 [name], [name] ([id number]) -------------------------------------------------------------------------------- Progress Note Details Patient Name: [name] [name] Date of Service: 10/25/2017 11:15 AM Medical Record Number: [id number] Patient Account Number: [id number] Date of Birth/Sex: 1955/05/07 (62 y.o. F) Treating RN: Cornell Barman Primary Care Provider: Barbaraann Boys Other Clinician: Referring Provider: Barbaraann Boys Treating Provider/Extender: Tito Dine in Treatment: 3 Subjective History of Present Illness (HPI) ADMISSION 10/04/17; this is a 62 year old nondiabetic woman who was in her usual state until 2 weeks ago. She says she was walking at work and noticed a rubbing sensation on her right great toe. She didn't really think too much about this however later on noted a significant blister over right great toe. She was seen in the urgent care on 09/22/17 started on Keflex. She was later seen in her primary physician's office in 09/25/17 and started on doxycycline. She is continued to have expansion of the blister over the top of her dorsal foot. Along the lines of the base of her toes and metastatic tarsals. She denies any pain, joint pain swelling stiffness or other systemic symptoms. She is not a diabetic but she is a prediabetic. 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 She does have a history of stage II chronic renal failure, gastroesophageal reflux disease, gout, heart murmur, endometrial cancer iron deficiency hypertension, hypothyroidism, obesity, "prediabetic" and skin  cancer dating back to 2012 The patient is still taking doxycycline. We attempted to do ABIs in her clinic noncompressible on the left she did not tolerated on the right 10/11/17; patient who came in to clinic with a [place] flaccid blister over the dorsal part of her right foot just proximal to her toes but including the right great toe. Culture I did last week was negative. As the blister was flaccid not  think any of the skin with adherence therefore the blister was opened. Been using silver alginate. The exact cause of this was not clear but it was probably footwear trauma rubbing in this setting of excessive sweat. The patient agrees largely with this there he 10/18/17; still some denuded skin around the wound circumference. This appears to be contracting and epithelializing nicely. Still using silver alginate. 10/25/17; the patient's areas fully epithelialized. New epithelium somewhat vulnerable. We've advised to keep this covered and perhaps continuing the surgical shoe to avoid friction and her footwear. This may have all been made pressures/friction area in her footwear. There may have been coexistent cellulitis as she was initially started on an antibiotic. When she came here she had a [place] flaccid blister which clearly needed to be excised. She has done well Objective Constitutional Patient is hypertensive.. Pulse regular and within target range for patient.Marland Kitchen Respirations regular, non-labored and within target range.. Temperature is normal and within the target range for the patient.Marland Kitchen appears in no distress. Vitals Time Taken: 11:18 AM, Height: 69 in, Weight: 338 lbs, BMI: 49.9, Temperature: 98.1 F, Pulse: 55 bpm, Respiratory Rate: 18 breaths/min, Blood Pressure: 162/89 mmHg. General Notes: wound exam; hearing questions on the dorsal right foot across her dorsal metatarsals. All of this is Schrum, [name] ([id number]) epithelialized. There was some flaking debris over the lateral part but  underneath his there is no open wound. No evidence of infection. Integumentary (Hair, Skin) Wound #1 status is Healed - Epithelialized. Original cause of wound was Blister. The wound is located on the Right,Dorsal Foot. The wound measures 0cm length x 0cm width x 0cm depth; 0cm^2 area and 0cm^3 volume. There is no tunneling or undermining noted. There is a medium amount of serous drainage noted. The wound margin is distinct with the outline attached to the wound base. There is no granulation within the wound bed. There is a Granberry (67-100%) amount of necrotic tissue within the wound bed including Eschar. The periwound skin appearance exhibited: Rubor. The periwound skin appearance did not exhibit: Callus, Crepitus, Excoriation, Induration, Rash, Scarring, Dry/Scaly, Maceration, Atrophie Blanche, Cyanosis, Ecchymosis, Hemosiderin Staining, Mottled, Pallor, Erythema. Periwound temperature was noted as No Abnormality. The periwound has tenderness on palpation. Assessment Active Problems ICD-10 Non-pressure chronic ulcer of other part of right foot limited to breakdown of skin Cellulitis of right lower limb Plan Discharge From [facility] Services: Wound #1 Right,Dorsal Foot: Discharge from [facility] complete Medical Decision Making Non-pressure chronic ulcer of other part of right foot limited to breakdown of skin 10/04/2017 Status: Improving Complications: N/a Interventions: the patient's wound appears to be improving. Aggressive epithelialization. Using silver alginate Cellulitis of right lower limb 10/04/2017 Status: Controlled Complications: no evidence of active infection Interventions: no antibiotics are necessary #1 the patient to be discharged from the clinic #2 we have advised to keep this area free of direct friction with her shoes for perhaps another 2 weeks or so. The patient expressed understanding. Other than that no secondary prevention Electronic  Signature(s) Signed: 10/27/2017 8:34:07 AM By: Linton Ham MD Entered By: Linton Ham on 10/25/2017 15:48:41 Kidney, [name] ([id number]) Gadd, [name] ([id number]) -------------------------------------------------------------------------------- SuperBill Details Patient Name: [name] [name] Date of Service: 10/25/2017 Medical Record Number: [id number] Patient Account Number: [id number] Date of Birth/Sex: 1956/02/21 (62 y.o. 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

## 2017-10-27 NOTE — Progress Notes (Signed)
[name] [name], [name] [name] ([id number]) Visit Report for 10/25/2017 Arrival Information Details Patient Name: [name] [name], [name] [name] Date of Service: 10/25/2017 11:15 AM Medical Record Number: [id number] Patient Account Number: [id number] Date of Birth/Sex: 1956/02/26 (62 y.o. F) Treating RN: [name] [name] Primary Care [name] [name]: [name] [name] Other Clinician: Referring [name] [name]: [name] [name] Treating [name] [name]/Extender: [name] [name] in Treatment: 3 Visit Information History Since Last Visit All ordered tests and consults were completed: No Patient Arrived: Ambulatory Added or deleted any medications: No Arrival Time: 11:17 Any new allergies or adverse reactions: No Accompanied By: self Had a fall or experienced change in No Transfer Assistance: None activities of daily living that may affect Patient Identification Verified: Yes risk of falls: Secondary Verification Process Completed: Yes Signs or symptoms of abuse/neglect since last visito No Patient Requires Transmission-Based No Hospitalized since last visit: No Precautions: Implantable device outside of the clinic excluding No Patient Has Alerts: Yes cellular tissue based products placed in the center Patient Alerts: L Leg ABI since last visit: >220 Pain Present Now: No Electronic Signature(s) Signed: 10/25/2017 3:34:51 PM By: [name] [name] Entered By: [name] [name] on 10/25/2017 11:17:33 [name] [name], [name] [name] ([id number]) -------------------------------------------------------------------------------- Clinic Level of Care Assessment Details Patient Name: [name] [name] Date of Service: 10/25/2017 11:15 AM Medical Record Number: [id number] Patient Account Number: [id number] Date of Birth/Sex: [birth year] (62 y.o. F) Treating RN: [name] [name] Primary Care [name] [name]: [name] [name] Other Clinician: Referring Lynsi Dooner: [name] [name] Treating [name] [name]/Extender: [name] [name] in Treatment: 3 Clinic Level of Care  Assessment Items TOOL 4 Quantity Score []  - Use when only an EandM is performed on FOLLOW-UP visit 0 ASSESSMENTS - Nursing Assessment / Reassessment []  - Reassessment of Co-morbidities (includes updates in patient status) 0 X- 1 5 Reassessment of Adherence to Treatment Plan ASSESSMENTS - Wound and Skin Assessment / Reassessment []  - Simple Wound Assessment / Reassessment - one wound 0 []  - 0 Complex Wound Assessment / Reassessment - multiple wounds []  - 0 Dermatologic / Skin Assessment (not related to wound area) ASSESSMENTS - Focused Assessment []  - Circumferential Edema Measurements - multi extremities 0 []  - 0 Nutritional Assessment / Counseling / Intervention []  - 0 Lower Extremity Assessment (monofilament, tuning fork, pulses) []  - 0 Peripheral Arterial Disease Assessment (using hand held doppler) ASSESSMENTS - Ostomy and/or Continence Assessment and Care []  - Incontinence Assessment and Management 0 []  - 0 Ostomy Care Assessment and Management (repouching, etc.) PROCESS - Coordination of Care X - Simple Patient / Family Education for ongoing care 1 15 []  - 0 Complex (extensive) Patient / Family Education for ongoing care []  - 0 Staff obtains Programmer, systems, Records, Test Results / Process Orders []  - 0 Staff telephones HHA, Nursing Homes / Clarify orders / etc []  - 0 Routine Transfer to another Facility (non-emergent condition) []  - 0 Routine Hospital Admission (non-emergent condition) []  - 0 New Admissions / Biomedical engineer / Ordering NPWT, Apligraf, etc. []  - 0 Emergency Hospital Admission (emergent condition) X- 1 10 Simple Discharge Coordination Hern, [name] ([id number]) []  - 0 Complex (extensive) Discharge Coordination PROCESS - Special Needs []  - Pediatric / Minor Patient Management 0 []  - 0 Isolation Patient Management []  - 0 Hearing / Language / Visual special needs []  - 0 Assessment of Community assistance (transportation, D/C planning,  etc.) []  - 0 Additional assistance / Altered mentation []  - 0 Support Surface(s) Assessment (bed, cushion, seat, etc.) INTERVENTIONS - Wound Cleansing / Measurement []  - Simple Wound Cleansing - one wound 0 []  -  0 Complex Wound Cleansing - multiple wounds []  - 0 Wound Imaging (photographs - any number of wounds) []  - 0 Wound Tracing (instead of photographs) []  - 0 Simple Wound Measurement - one wound []  - 0 Complex Wound Measurement - multiple wounds INTERVENTIONS - Wound Dressings X - Small Wound Dressing one or multiple wounds 1 10 []  - 0 Medium Wound Dressing one or multiple wounds []  - 0 [name] []  - 0 Application of Medications - topical []  - 0 Application of Medications - injection INTERVENTIONS - Miscellaneous []  - External ear exam 0 []  - 0 Specimen Collection (cultures, biopsies, blood, body fluids, etc.) []  - 0 Specimen(s) / Culture(s) sent or taken to Lab for analysis []  - 0 Patient Transfer (multiple staff / Civil Service fast streamer / Similar devices) []  - 0 Simple Staple / Suture removal (25 or less) []  - 0 Complex Staple / Suture removal (26 or more) []  - 0 Hypo / Hyperglycemic Management (close monitor of Blood Glucose) []  - 0 Ankle / Brachial Index (ABI) - do not check if billed separately X- 1 5 Vital Signs [name], [name] ([id number]) Has the patient been seen at the hospital within the last three years: Yes Total Score: 45 Level Of Care: New/Established - Level 2 Electronic Signature(s) Signed: 10/25/2017 4:43:24 PM By: [name] [name], BSN, RN, CWS, [name] [name] Entered By: [name] [name], BSN, RN, CWS, [name] on 10/25/2017 11:51:26 [name] [name] ([id number]) -------------------------------------------------------------------------------- Encounter Discharge Information Details Patient Name: [name] [name] Date of Service: 10/25/2017 11:15 AM Medical Record Number: [id number] Patient Account Number: [id number] Date of Birth/Sex: 08/01/1955  (62 y.o. F) Treating RN: [name] [name] Primary Care Alexiz Sustaita: [name] [name] Other Clinician: Referring Tazaria Dlugosz: [name] [name] Treating Cortland Crehan/Extender: [name] [name] in Treatment: 3 Encounter Discharge Information Items Discharge Condition: Stable Ambulatory Status: Ambulatory Discharge Destination: Home Transportation: Private Auto Schedule Follow-up Appointment: Yes Clinical Summary of Care: Electronic Signature(s) Signed: 10/25/2017 4:43:24 PM By: [name] [name], BSN, RN, CWS, [name] [name] Entered By: [name] [name], BSN, RN, CWS, [name] on 10/25/2017 11:54:44 [place], [name] [name] ([id number]) -------------------------------------------------------------------------------- Lower Extremity Assessment Details Patient Name: [name] [name] Date of Service: 10/25/2017 11:15 AM Medical Record Number: [id number] Patient Account Number: [id number] Date of Birth/Sex: 1956-02-09 (62 y.o. F) Treating RN: [name] [name] Primary Care Jersi Mcmaster: [name] [name] Other Clinician: Referring Jovonte Commins: [name] [name] Treating Rani Idler/Extender: [name] [name] in Treatment: 3 Edema Assessment Assessed: [Left: No] [Right: No] Edema: [Left: Ye] [Right: s] Calf Left: Right: Point of Measurement: 36 cm From Medial Instep cm 54.8 cm Ankle Left: Right: Point of Measurement: 11 cm From Medial Instep cm 31.5 cm Vascular Assessment Claudication: Claudication Assessment [Right:None] Pulses: Dorsalis Pedis Palpable: [Right:Yes] Posterior Tibial Extremity colors, hair growth, and conditions: Extremity Color: [Right:Normal] Hair Growth on Extremity: [Right:No] Temperature of Extremity: [Right:Warm] Capillary Refill: [Right:< 3 seconds] Toe Nail Assessment Left: Right: Thick: No Discolored: No Deformed: No Improper Length and Hygiene: No Electronic Signature(s) Signed: 10/25/2017 3:34:51 PM By: [name] [name] Entered By: [name] [name] on 10/25/2017 11:33:08 Labella, [name] [name]  ([id number]) -------------------------------------------------------------------------------- Multi Wound Chart Details Patient Name: [name] [name] Date of Service: 10/25/2017 11:15 AM Medical Record Number: [id number] Patient Account Number: [id number] Date of Birth/Sex: 06-15-1955 (62 y.o. F) Treating RN: [name] [name] Primary Care Jerrianne Hartin: [name] [name] Other Clinician: Referring Jacquan Savas: [name] [name] Treating Tritia Endo/Extender: [name] [name] in Treatment: 3 Vital Signs Height(in): 69 Pulse(bpm): 55 Weight(lbs): 338 Blood Pressure(mmHg): 162/89 Body Mass Index(BMI): 50 Temperature(F): 98.1 Respiratory  Rate 18 (breaths/min): Photos: [N/A:N/A] Wound Location: Right, Dorsal Foot N/A N/A Wounding Event: Blister N/A N/A Primary Etiology: Pressure Ulcer N/A N/A Comorbid History: Anemia, Arrhythmia, N/A N/A Hypertension, Gout Date Acquired: 09/20/2017 N/A N/A Weeks of Treatment: 3 N/A N/A Wound Status: Healed - Epithelialized N/A N/A Measurements L x W x D 0x0x0 N/A N/A (cm) Area (cm) : 0 N/A N/A Volume (cm) : 0 N/A N/A % Reduction in Area: 100.00% N/A N/A % Reduction in Volume: 100.00% N/A N/A Classification: Category/Stage II N/A N/A Exudate Amount: Medium N/A N/A Exudate Type: Serous N/A N/A Exudate Color: amber N/A N/A Wound Margin: Distinct, outline attached N/A N/A Granulation Amount: None Present (0%) N/A N/A Necrotic Amount: Kienast (67-100%) N/A N/A Necrotic Tissue: Eschar N/A N/A Exposed Structures: Fascia: No N/A N/A Fat Layer (Subcutaneous Tissue) Exposed: No Tendon: No Muscle: No Joint: No Bone: No Roberson, Mckaylin J. ([id number]) Epithelialization: None N/A N/A Periwound Skin Texture: Excoriation: No N/A N/A Induration: No Callus: No Crepitus: No Rash: No Scarring: No Periwound Skin Moisture: Maceration: No N/A N/A Dry/Scaly: No Periwound Skin Color: Rubor: Yes N/A N/A Atrophie Blanche: No Cyanosis: No Ecchymosis: No Erythema:  No Hemosiderin Staining: No Mottled: No Pallor: No Temperature: No Abnormality N/A N/A Tenderness on Palpation: Yes N/A N/A Wound Preparation: Ulcer Cleansing: N/A N/A Rinsed/Irrigated with Saline Topical Anesthetic Applied: Other: lidocaine 4% Treatment Notes Wound #1 (Right, Dorsal Foot) Notes mepitel pad Electronic Signature(s) Signed: 10/27/2017 8:34:07 AM By: Linton Ham MD Entered By: Linton Ham on 10/25/2017 15:45:34 Gron[place], [name] [name] ([id number]) -------------------------------------------------------------------------------- Multi-Disciplinary Care Plan Details Patient Name: [name] [name] Date of Service: 10/25/2017 11:15 AM Medical Record Number: [id number] Patient Account Number: [id number] Date of Birth/Sex: 08-28-1955 (62 y.o. F) Treating RN: [name] [name] Primary Care Denean Pavon: [name] [name] Other Clinician: Referring Arihana Ambrocio: [name] [name] Treating Bruin Bolger/Extender: [name] [name] in Treatment: 3 Active Inactive Electronic Signature(s) Signed: 10/25/2017 4:43:24 PM By: [name] [name], BSN, RN, CWS, [name] [name] Entered By: [name] [name], BSN, RN, CWS, [name] on 10/25/2017 11:49:44 [place], [name] [name] ([id number]) -------------------------------------------------------------------------------- Pain Assessment Details Patient Name: [name] [name] Date of Service: 10/25/2017 11:15 AM Medical Record Number: [id number] Patient Account Number: [id number] Date of Birth/Sex: Jan 07, 1956 (62 y.o. F) Treating RN: [name] [name] Primary Care Vivika Poythress: [name] [name] Other Clinician: Referring Tyniya Kuyper: [name] [name] Treating Stesha Neyens/Extender: [name] [name] in Treatment: 3 Active Problems Location of Pain Severity and Description of Pain Patient Has Paino No Site Locations Pain Management and Medication Current Pain Management: Electronic Signature(s) Signed: 10/25/2017 3:34:51 PM By: [name] [name] Entered By: [name] [name] on 10/25/2017 11:18:09 Baughman,  [name] [name] ([id number]) -------------------------------------------------------------------------------- Patient/Caregiver Education Details Patient Name: [name] [name] Date of Service: 10/25/2017 11:15 AM Medical Record Number: [id number] Patient Account Number: [id number] Date of Birth/Gender: May 16, 1955 (62 y.o. F) Treating RN: [name] [name] Primary Care Physician: [name] [name] Other Clinician: Referring Physician: Barbaraann [name] Treating Physician/Extender: [name] [name] in Treatment: 3 Education Assessment Education Provided To: Patient Education Topics Provided Wound/Skin Impairment: Handouts: Skin Care Do's and Dont's Methods: Explain/Verbal Responses: State content correctly Electronic Signature(s) Signed: 10/25/2017 4:43:24 PM By: [name] [name], BSN, RN, CWS, [name] [name] Entered By: [name] [name], BSN, RN, CWS, [name] on 10/25/2017 11:55:02 [place], [name] [name] ([id number]) -------------------------------------------------------------------------------- Wound Assessment Details Patient Name: [name] [name] Date of Service: 10/25/2017 11:15 AM Medical Record Number: [id number] Patient Account Number: [id number] Date of Birth/Sex: 1955-09-27 (62 y.o. F) Treating RN: [name] [name] Primary Care Khaliel Morey: [name] [name] Other Clinician: Referring Raimi Guillermo: [name] [name] Treating Doratha Mcswain/Extender:  ROBSON, MICHAEL G Weeks in Treatment: 3 Wound Status Wound Number: 1 Primary Etiology: Pressure Ulcer Wound Location: Right, Dorsal Foot Wound Status: Healed - Epithelialized Wounding Event: Blister Comorbid History: Anemia, Arrhythmia, Hypertension, Gout Date Acquired: 09/20/2017 Weeks Of Treatment: 3 Clustered Wound: No Photos Photo Uploaded By: Alric Quan on 10/25/2017 15:20:09 Wound Measurements Length: (cm) 0 % Re Width: (cm) 0 % Re Depth: (cm) 0 Epit Area: (cm) 0 Tun Volume: (cm) 0 Und duction in Area: 100% duction in Volume: 100% helialization: None neling:  No ermining: No Wound Description Classification: Category/Stage II Wound Margin: Distinct, outline attached Exudate Amount: Medium Exudate Type: Serous Exudate Color: amber Foul Odor After Cleansing: No Slough/Fibrino No Wound Bed Granulation Amount: None Present (0%) Exposed Structure Necrotic Amount: Paff (67-100%) Fascia Exposed: No Necrotic Quality: Eschar Fat Layer (Subcutaneous Tissue) Exposed: No Tendon Exposed: No Muscle Exposed: No Joint Exposed: No Bone Exposed: No Periwound Skin Texture Venti, Lolitha J. ([id number]) Texture Color No Abnormalities Noted: No No Abnormalities Noted: No Callus: No Atrophie Blanche: No Crepitus: No Cyanosis: No Excoriation: No Ecchymosis: No Induration: No Erythema: No Rash: No Hemosiderin Staining: No Scarring: No Mottled: No Pallor: No Moisture Rubor: Yes No Abnormalities Noted: No Dry / Scaly: No Temperature / Pain Maceration: No Temperature: No Abnormality Tenderness on Palpation: Yes Wound Preparation Ulcer Cleansing: Rinsed/Irrigated with Saline Topical Anesthetic Applied: Other: lidocaine 4%, Treatment Notes Wound #1 (Right, Dorsal Foot) Notes mepitel pad Electronic Signature(s) Signed: 10/25/2017 4:43:24 PM By: [name] [name], BSN, RN, CWS, [name] [name] Entered By: [name] [name], BSN, RN, CWS, [name] on 10/25/2017 11:51:39 [place], [name] [name] ([id number]) -------------------------------------------------------------------------------- Vitals Details Patient Name: [name] [name] Date of Service: 10/25/2017 11:15 AM Medical Record Number: [id number] Patient Account Number: [id number] Date of Birth/Sex: October 08, 1955 (62 y.o. F) Treating RN: [name] [name] Primary Care Aijalon Kirtz: [name] [name] Other Clinician: Referring Kaleb Sek: [name] [name] Treating Geovanie Winnett/Extender: [name] [name] in Treatment: 3 Vital Signs Time Taken: 11:18 Temperature (F): 98.1 Height (in): 69 Pulse (bpm): 55 Weight (lbs): 338 Respiratory  Rate (breaths/min): 18 Body Mass Index (BMI): 49.9 Blood Pressure (mmHg): 162/89 Reference Range: 80 - 120 mg / dl Electronic Signature(s) Signed: 10/25/2017 3:34:51 PM By: [name] [name] Entered By: [name] [name] on 10/25/2017 11:20:41

## 2020-08-30 ENCOUNTER — Other Ambulatory Visit: Payer: Self-pay

## 2020-08-30 ENCOUNTER — Emergency Department: Payer: Worker's Compensation

## 2020-08-30 ENCOUNTER — Inpatient Hospital Stay
Admission: EM | Admit: 2020-08-30 | Discharge: 2020-09-12 | DRG: 871 | Disposition: A | Discharge status: Home Health | Payer: Worker's Compensation | Attending: Internal Medicine | Admitting: Internal Medicine

## 2020-08-30 DIAGNOSIS — I712 Thoracic aortic aneurysm, without rupture: Secondary | ICD-10-CM | POA: Diagnosis present

## 2020-08-30 DIAGNOSIS — I129 Hypertensive chronic kidney disease with stage 1 through stage 4 chronic kidney disease, or unspecified chronic kidney disease: Secondary | ICD-10-CM | POA: Diagnosis present

## 2020-08-30 DIAGNOSIS — Z880 Allergy status to penicillin: Secondary | ICD-10-CM

## 2020-08-30 DIAGNOSIS — Z7901 Long term (current) use of anticoagulants: Secondary | ICD-10-CM

## 2020-08-30 DIAGNOSIS — E872 Acidosis, unspecified: Secondary | ICD-10-CM

## 2020-08-30 DIAGNOSIS — Z888 Allergy status to other drugs, medicaments and biological substances status: Secondary | ICD-10-CM

## 2020-08-30 DIAGNOSIS — N179 Acute kidney failure, unspecified: Secondary | ICD-10-CM | POA: Diagnosis present

## 2020-08-30 DIAGNOSIS — E871 Hypo-osmolality and hyponatremia: Secondary | ICD-10-CM | POA: Diagnosis not present

## 2020-08-30 DIAGNOSIS — A4189 Other specified sepsis: Principal | ICD-10-CM | POA: Diagnosis present

## 2020-08-30 DIAGNOSIS — R509 Fever, unspecified: Secondary | ICD-10-CM | POA: Diagnosis not present

## 2020-08-30 DIAGNOSIS — K219 Gastro-esophageal reflux disease without esophagitis: Secondary | ICD-10-CM | POA: Diagnosis present

## 2020-08-30 DIAGNOSIS — Z79899 Other long term (current) drug therapy: Secondary | ICD-10-CM

## 2020-08-30 DIAGNOSIS — Z6841 Body Mass Index (BMI) 40.0 and over, adult: Secondary | ICD-10-CM

## 2020-08-30 DIAGNOSIS — R7303 Prediabetes: Secondary | ICD-10-CM | POA: Diagnosis present

## 2020-08-30 DIAGNOSIS — Z950 Presence of cardiac pacemaker: Secondary | ICD-10-CM

## 2020-08-30 DIAGNOSIS — Z833 Family history of diabetes mellitus: Secondary | ICD-10-CM

## 2020-08-30 DIAGNOSIS — Z20822 Contact with and (suspected) exposure to covid-19: Secondary | ICD-10-CM | POA: Diagnosis present

## 2020-08-30 DIAGNOSIS — Z79890 Hormone replacement therapy (postmenopausal): Secondary | ICD-10-CM

## 2020-08-30 DIAGNOSIS — R6521 Severe sepsis with septic shock: Secondary | ICD-10-CM | POA: Diagnosis present

## 2020-08-30 DIAGNOSIS — N182 Chronic kidney disease, stage 2 (mild): Secondary | ICD-10-CM | POA: Diagnosis present

## 2020-08-30 DIAGNOSIS — E860 Dehydration: Secondary | ICD-10-CM | POA: Diagnosis present

## 2020-08-30 DIAGNOSIS — Z8249 Family history of ischemic heart disease and other diseases of the circulatory system: Secondary | ICD-10-CM

## 2020-08-30 DIAGNOSIS — E039 Hypothyroidism, unspecified: Secondary | ICD-10-CM | POA: Diagnosis present

## 2020-08-30 DIAGNOSIS — K5100 Ulcerative (chronic) pancolitis without complications: Secondary | ICD-10-CM

## 2020-08-30 DIAGNOSIS — D631 Anemia in chronic kidney disease: Secondary | ICD-10-CM | POA: Diagnosis present

## 2020-08-30 DIAGNOSIS — I480 Paroxysmal atrial fibrillation: Secondary | ICD-10-CM | POA: Diagnosis present

## 2020-08-30 DIAGNOSIS — I495 Sick sinus syndrome: Secondary | ICD-10-CM | POA: Diagnosis present

## 2020-08-30 DIAGNOSIS — A0472 Enterocolitis due to Clostridium difficile, not specified as recurrent: Secondary | ICD-10-CM | POA: Diagnosis present

## 2020-08-30 DIAGNOSIS — E861 Hypovolemia: Secondary | ICD-10-CM | POA: Diagnosis present

## 2020-08-30 DIAGNOSIS — M109 Gout, unspecified: Secondary | ICD-10-CM | POA: Diagnosis present

## 2020-08-30 DIAGNOSIS — A419 Sepsis, unspecified organism: Secondary | ICD-10-CM | POA: Diagnosis present

## 2020-08-30 DIAGNOSIS — R571 Hypovolemic shock: Secondary | ICD-10-CM | POA: Diagnosis present

## 2020-08-30 HISTORY — DX: Unspecified atrial fibrillation: I48.91

## 2020-08-30 LAB — CBC WITH DIFFERENTIAL/PLATELET
Abs Immature Granulocytes: 0.3 10*3/uL — ABNORMAL HIGH (ref 0.00–0.07)
Basophils Absolute: 0.2 10*3/uL — ABNORMAL HIGH (ref 0.0–0.1)
Basophils Relative: 1 %
Eosinophils Absolute: 0 10*3/uL (ref 0.0–0.5)
Eosinophils Relative: 0 %
HCT: 32.3 % — ABNORMAL LOW (ref 36.0–46.0)
Hemoglobin: 10.8 g/dL — ABNORMAL LOW (ref 12.0–15.0)
Immature Granulocytes: 1 %
Lymphocytes Relative: 2 %
Lymphs Abs: 0.6 10*3/uL — ABNORMAL LOW (ref 0.7–4.0)
MCH: 33.6 pg (ref 26.0–34.0)
MCHC: 33.4 g/dL (ref 30.0–36.0)
MCV: 100.6 fL — ABNORMAL HIGH (ref 80.0–100.0)
Monocytes Absolute: 2.9 10*3/uL — ABNORMAL HIGH (ref 0.1–1.0)
Monocytes Relative: 10 %
Neutro Abs: 25.1 10*3/uL — ABNORMAL HIGH (ref 1.7–7.7)
Neutrophils Relative %: 86 %
Platelets: 263 10*3/uL (ref 150–400)
RBC: 3.21 MIL/uL — ABNORMAL LOW (ref 3.87–5.11)
RDW: 13.9 % (ref 11.5–15.5)
WBC: 29.1 10*3/uL — ABNORMAL HIGH (ref 4.0–10.5)
nRBC: 0 % (ref 0.0–0.2)

## 2020-08-30 LAB — PROTIME-INR
INR: 1.3 — ABNORMAL HIGH (ref 0.8–1.2)
Prothrombin Time: 16.5 seconds — ABNORMAL HIGH (ref 11.4–15.2)

## 2020-08-30 LAB — COMPREHENSIVE METABOLIC PANEL
ALT: 12 U/L (ref 0–44)
AST: 21 U/L (ref 15–41)
Albumin: 2.5 g/dL — ABNORMAL LOW (ref 3.5–5.0)
Alkaline Phosphatase: 105 U/L (ref 38–126)
Anion gap: 10 (ref 5–15)
BUN: 42 mg/dL — ABNORMAL HIGH (ref 8–23)
CO2: 19 mmol/L — ABNORMAL LOW (ref 22–32)
Calcium: 8.2 mg/dL — ABNORMAL LOW (ref 8.9–10.3)
Chloride: 101 mmol/L (ref 98–111)
Creatinine, Ser: 2.15 mg/dL — ABNORMAL HIGH (ref 0.44–1.00)
GFR, Estimated: 25 mL/min — ABNORMAL LOW (ref >60)
Glucose, Bld: 125 mg/dL — ABNORMAL HIGH (ref 70–99)
Potassium: 4.1 mmol/L (ref 3.5–5.1)
Sodium: 130 mmol/L — ABNORMAL LOW (ref 135–145)
Total Bilirubin: 0.8 mg/dL (ref 0.3–1.2)
Total Protein: 4.9 g/dL — ABNORMAL LOW (ref 6.5–8.1)

## 2020-08-30 LAB — RESP PANEL BY RT-PCR (FLU A&B, COVID) ARPGX2
Influenza A by PCR: NEGATIVE
Influenza B by PCR: NEGATIVE
SARS Coronavirus 2 by RT PCR: NEGATIVE

## 2020-08-30 LAB — LACTIC ACID, PLASMA: Lactic Acid, Venous: 2.8 mmol/L (ref 0.5–1.9)

## 2020-08-30 LAB — APTT: aPTT: 31 seconds (ref 24–36)

## 2020-08-30 MED ORDER — SODIUM CHLORIDE 0.9 % IV SOLN
2.0000 g | Freq: Once | INTRAVENOUS | Status: AC
  Administered 2020-08-30: 2 g via INTRAVENOUS
  Filled 2020-08-30: qty 2

## 2020-08-30 MED ORDER — METRONIDAZOLE 500 MG/100ML IV SOLN
500.0000 mg | Freq: Once | INTRAVENOUS | Status: AC
  Administered 2020-08-30: 500 mg via INTRAVENOUS
  Filled 2020-08-30: qty 100

## 2020-08-30 MED ORDER — LACTATED RINGERS IV BOLUS (SEPSIS)
1000.0000 mL | Freq: Once | INTRAVENOUS | Status: AC
  Administered 2020-08-30: 1000 mL via INTRAVENOUS

## 2020-08-30 MED ORDER — SODIUM CHLORIDE 0.9 % IV SOLN
250.0000 mL | INTRAVENOUS | Status: DC
  Administered 2020-08-31 – 2020-09-09 (×2): 250 mL via INTRAVENOUS

## 2020-08-30 MED ORDER — VANCOMYCIN 50 MG/ML ORAL SOLUTION
500.0000 mg | Freq: Four times a day (QID) | ORAL | Status: DC
  Administered 2020-08-31 – 2020-09-08 (×33): 500 mg via ORAL
  Filled 2020-08-30 (×44): qty 10

## 2020-08-30 MED ORDER — LACTATED RINGERS IV BOLUS (SEPSIS)
500.0000 mL | Freq: Once | INTRAVENOUS | Status: AC
  Administered 2020-08-30: 500 mL via INTRAVENOUS

## 2020-08-30 MED ORDER — FENTANYL CITRATE (PF) 100 MCG/2ML IJ SOLN
25.0000 ug | Freq: Once | INTRAMUSCULAR | Status: AC
  Administered 2020-08-30: 25 ug via INTRAVENOUS
  Filled 2020-08-30: qty 2

## 2020-08-30 MED ORDER — ONDANSETRON HCL 4 MG/2ML IJ SOLN
4.0000 mg | Freq: Once | INTRAMUSCULAR | Status: AC
  Administered 2020-08-30: 4 mg via INTRAVENOUS
  Filled 2020-08-30: qty 2

## 2020-08-30 NOTE — Progress Notes (Signed)
PHARMACY -  BRIEF ANTIBIOTIC NOTE   Pharmacy has received consult(s) for Cefepime from an ED provider.  The patient's profile has been reviewed for ht/wt/allergies/indication/available labs.    One time order(s) placed for Cefepime 2 gm.  Further antibiotics/pharmacy consults should be ordered by admitting physician if indicated.                       Renda Rolls, PharmD, [facility] 08/30/2020 10:15 PM

## 2020-08-30 NOTE — ED Triage Notes (Signed)
Pt is a 65 y/o coming in via EMS from home with a cc of n/v/d x1 week and a fever since last night. Pt reports temp of 101.9 after taking tylenol approx 3 hours ago. Pt had a fall in mid April and has a [place]gallon hematoma to R knee and a smaller forehead hematoma. Pt was then seen for concern for cellulitis and reports being admitted to the hospital. D/c on Keflex and reports she got "worse". Not currently taking any abx. Alert and oriented x4. Pressure 88/38 with medic and improved to 112/80 after 32mL of NS.

## 2020-08-30 NOTE — ED Notes (Signed)
ED Provider at bedside with update regarding results.

## 2020-08-30 NOTE — ED Notes (Signed)
Patient transported to CT 

## 2020-08-30 NOTE — Progress Notes (Signed)
CODE SEPSIS - PHARMACY COMMUNICATION  **Broad Spectrum Antibiotics should be administered within 1 hour of Sepsis diagnosis**  Time Code Sepsis Called/Page Received: 2211  Antibiotics Ordered: Cefepime and Flagyl  Time of 1st antibiotic administration: [place], PharmD, [facility] 08/30/2020 10:13 PM

## 2020-08-30 NOTE — ED Provider Notes (Signed)
[facility] Emergency Department Provider Note  ____________________________________________   Event Date/Time   First MD Initiated Contact with Patient 08/30/20 2208     (approximate)  I have reviewed the triage vital signs and the nursing notes.   HISTORY  Chief Complaint Fever and Diarrhea    HPI [name] [name] is a 65 y.o. female    here with abdominal pain, diarrhea, fevers.  The patient reports that she was recently hospitalized at [facility] for cellulitis.  She was on vancomycin during the hospitalization.  She is been home for approximately a week.  Since then, she has had progressively worsening aching, gnawing, generalized abdominal pain, cramping, with inability to eat or drink.  She has had profuse, nonbloody, but foul-smelling diarrhea.  She has had associated intermittent abdominal cramping but this is not persistent.  She said chills.  She saw her PCP for this and had an outpatient stool study ordered but she has not been able to test this yet.  Denies any urinary symptoms.  She does note that she said decreased urine output and darker than usual urine, though no overt dysuria.  Denies any specific alleviating factors.       Past Medical History:  Diagnosis Date  . Anemia   . Arrhythmia   . Atrial fibrillation ([place])   . Cancer ([place])   . Endometriosis   . GERD (gastroesophageal reflux disease)   . Gout   . Heart disease   . Hypothyroidism   . Obesity   . Prediabetes   . Renal disorder   . Ventricular hypertrophy     Patient Active Problem List   Diagnosis Date Noted  . Septic shock ([place]) 08/31/2020    Past Surgical History:  Procedure Laterality Date  . ABDOMINAL HYSTERECTOMY    . ADENOIDECTOMY    . DILATION AND CURETTAGE OF UTERUS    . TONSILLECTOMY      Prior to Admission medications   Medication Sig Start Date End Date Taking? Authorizing Provider  allopurinol (ZYLOPRIM) 100 MG tablet Take 100 mg by mouth 2 (two) times  daily.   Yes [provider]  carvedilol (COREG) 25 MG tablet Take 37.5 mg by mouth 2 (two) times daily. 06/11/20  Yes [provider]  flecainide (TAMBOCOR) 100 MG tablet Take 100 mg by mouth 2 (two) times daily. 08/13/20  Yes [provider]  furosemide (LASIX) 40 MG tablet Take 40 mg by mouth daily. 08/12/20  Yes [provider]  hydrALAZINE (APRESOLINE) 50 MG tablet Take 50 mg by mouth 2 (two) times daily. 05/08/20  Yes [provider]  levothyroxine (SYNTHROID, LEVOTHROID) 175 MCG tablet Take 175 mcg by mouth daily before breakfast.   Yes [provider]  losartan (COZAAR) 100 MG tablet Take by mouth. 09/11/17 08/30/20 Yes [provider]  methocarbamol (ROBAXIN) 500 MG tablet Take 1 tablet by mouth 3 (three) times daily. 08/13/20  Yes [provider]  metoCLOPramide (REGLAN) 5 MG tablet Take 5 mg by mouth 2 (two) times daily. 08/27/20  Yes [provider]  omeprazole (PRILOSEC) 20 MG capsule Take 1 capsule by mouth daily. 06/17/20  Yes [provider]  oxyCODONE (OXY IR/ROXICODONE) 5 MG immediate release tablet Take 5 mg by mouth every 4 (four) hours as needed. 08/07/20  Yes [provider]  oxyCODONE-acetaminophen (PERCOCET/ROXICET) 5-325 MG tablet Take 1 tablet by mouth every 8 (eight) hours as needed. 08/17/20  Yes [provider]  Potassium Chloride ER 20 MEQ  TBCR Take 1 tablet by mouth daily. 06/22/20  Yes [provider]  tiZANidine (ZANAFLEX) 2 MG tablet Take 2 mg by mouth 3 (three) times daily. 04/27/20  Yes [provider]  cephALEXin (KEFLEX) 500 MG capsule Take 500 mg by mouth every 6 (six) hours. Patient not taking: Reported on 08/30/2020 08/11/20   [provider]  colchicine 0.6 MG tablet Take 0.6 mg by mouth as needed.    [provider]  ELIQUIS 5 MG TABS tablet Take 1 tablet by mouth 2 (two) times daily. Patient not taking: No sig reported 08/08/20    [provider]    Allergies Amlodipine, Penicillins, and Tape  Family History  Problem Relation Age of Onset  . Diabetes Mother   . Hypertension Mother   . Heart attack Father     Social History Social History   Tobacco Use  . Smoking status: Never Smoker  . Smokeless tobacco: Never Used  Vaping Use  . Vaping Use: Never used  Substance Use Topics  . Alcohol use: No  . Drug use: No    Review of Systems  Review of Systems  Constitutional: Positive for fatigue. Negative for fever.  HENT: Negative for congestion and sore throat.   Eyes: Negative for visual disturbance.  Respiratory: Negative for cough and shortness of breath.   Cardiovascular: Negative for chest pain.  Gastrointestinal: Positive for abdominal pain, diarrhea, nausea and vomiting.  Genitourinary: Negative for flank pain.  Musculoskeletal: Positive for arthralgias. Negative for back pain and neck pain.  Skin: Negative for rash and wound.  Neurological: Positive for weakness.  All other systems reviewed and are negative.    ____________________________________________  PHYSICAL EXAM:      VITAL SIGNS: ED Triage Vitals  Enc Vitals Group     BP 08/30/20 2204 (!) 81/51     Pulse Rate 08/30/20 2204 65     Resp 08/30/20 2204 20     Temp 08/30/20 2204 99.7 F (37.6 C)     Temp Source 08/30/20 2204 Oral     SpO2 08/30/20 2204 96 %     Weight 08/30/20 2157 229 lb 11.5 oz (104.2 kg)     Height 08/30/20 2157 5\' 8"  (1.727 m)     Head Circumference --      Peak Flow --      Pain Score 08/30/20 2204 0     Pain Loc --      Pain Edu? --      Excl. in [place]? --      Physical Exam Vitals and nursing note reviewed.  Constitutional:      General: She is not in acute distress.    Appearance: She is well-developed.  HENT:     Head: Normocephalic and atraumatic.     Mouth/Throat:     Mouth: Mucous membranes are dry.  Eyes:     Conjunctiva/sclera: Conjunctivae normal.  Cardiovascular:     Rate  and Rhythm: Normal rate and regular rhythm.     Heart sounds: Normal heart sounds. No murmur heard. No friction rub.  Pulmonary:     Effort: Pulmonary effort is normal. No respiratory distress.     Breath sounds: Normal breath sounds. No wheezing or rales.  Abdominal:     General: Bowel sounds are increased. There is no distension.     Palpations: Abdomen is soft.     Tenderness: There is generalized abdominal tenderness.  Musculoskeletal:     Cervical back: Neck supple.  Skin:  General: Skin is warm.     Capillary Refill: Capillary refill takes less than 2 seconds.  Neurological:     Mental Status: She is alert and oriented to person, place, and time.     Motor: No abnormal muscle tone.       ____________________________________________   LABS (all labs ordered are listed, but only abnormal results are displayed)  Labs Reviewed  LACTIC ACID, PLASMA - Abnormal; Notable for the following components:      Result Value   Lactic Acid, Venous 2.8 (*)    All other components within normal limits  COMPREHENSIVE METABOLIC PANEL - Abnormal; Notable for the following components:   Sodium 130 (*)    CO2 19 (*)    Glucose, Bld 125 (*)    BUN 42 (*)    Creatinine, Ser 2.15 (*)    Calcium 8.2 (*)    Total Protein 4.9 (*)    Albumin 2.5 (*)    GFR, Estimated 25 (*)    All other components within normal limits  CBC WITH DIFFERENTIAL/PLATELET - Abnormal; Notable for the following components:   WBC 29.1 (*)    RBC 3.21 (*)    Hemoglobin 10.8 (*)    HCT 32.3 (*)    MCV 100.6 (*)    Neutro Abs 25.1 (*)    Lymphs Abs 0.6 (*)    Monocytes Absolute 2.9 (*)    Basophils Absolute 0.2 (*)    Abs Immature Granulocytes 0.30 (*)    All other components within normal limits  PROTIME-INR - Abnormal; Notable for the following components:   Prothrombin Time 16.5 (*)    INR 1.3 (*)    All other components within normal limits  MAGNESIUM - Abnormal; Notable for the following components:    Magnesium 1.5 (*)    All other components within normal limits  RESP PANEL BY RT-PCR (FLU A&B, COVID) ARPGX2  CULTURE, BLOOD (ROUTINE X 2)  CULTURE, BLOOD (ROUTINE X 2)  URINE CULTURE  C DIFFICILE QUICK SCREEN W PCR REFLEX  GASTROINTESTINAL PANEL BY PCR, STOOL (REPLACES STOOL CULTURE)  APTT  PHOSPHORUS  URINALYSIS, COMPLETE (UACMP) WITH MICROSCOPIC  CBC  MAGNESIUM  PHOSPHORUS  BASIC METABOLIC PANEL  HIV ANTIBODY (ROUTINE TESTING W REFLEX)  HEMOGLOBIN A1C    ____________________________________________  EKG:  ________________________________________  RADIOLOGY All imaging, including plain films, CT scans, and ultrasounds, independently reviewed by me, and interpretations confirmed via formal radiology reads.  ED MD interpretation:   CXR: Clear CT: Severe pancolitis, no perforation, no pneumatosis  Official radiology report(s): CT ABDOMEN PELVIS WO CONTRAST  Result Date: 08/30/2020 CLINICAL DATA:  Nonlocalized acute abdominal pain. Nausea vomiting and diarrhea. Fever. Recent hospitalization with antibiotic use. EXAM: CT ABDOMEN AND PELVIS WITHOUT CONTRAST TECHNIQUE: Multidetector CT imaging of the abdomen and pelvis was performed following the standard protocol without IV contrast. COMPARISON:  Chest x-ray 07/08/2014 FINDINGS: Lower chest: Passive atelectasis in the paravertebral right lower lobe. Calcified left base 8 mm pulmonary nodule consistent with known granuloma. Partially visualized cardiac leads. Hepatobiliary: The liver is enlarged measuring up to 22 cm. No focal liver abnormality. Nonspecific hydropic gallbladder. No gallstones, gallbladder wall thickening, or pericholecystic fluid. No biliary dilatation. Pancreas: No focal lesion. Normal pancreatic contour. No surrounding inflammatory changes. No main pancreatic ductal dilatation. Spleen: Multiple punctate calcifications likely sequelae of prior granulomatous disease. Normal in size without focal abnormality.  Adrenals/Urinary Tract: No adrenal nodule bilaterally. No nephrolithiasis, no hydronephrosis, and no contour-deforming renal mass. No ureterolithiasis or hydroureter.  The urinary bladder is unremarkable. Stomach/Bowel: Stomach is within normal limits. No evidence of small bowel wall thickening or dilatation. Extensive circumferential bowel wall thickening of the entire colon. Associated pericolonic fat stranding. Diffuse left colon and sigmoid diverticulosis. The appendix is not definitely identified. Vascular/Lymphatic: No portal venous gas. No mesenteric gas. No abdominal aorta or iliac aneurysm. Mild atherosclerotic plaque of the aorta and its branches. No abdominal, pelvic, or inguinal lymphadenopathy. Reproductive: Borderline enlarged retroperitoneal lymph nodes with no definite lymphadenopathy. Other: No intraperitoneal free fluid. No intraperitoneal free gas. No organized fluid collection. Musculoskeletal: Supraumbilical ventral wall hernia containing fat (6:75, 2:38 with an abdominal defect of 2.4 by 2.1 cm. Inferiorly there are two [place] adjacent hernias containing contiguous loops of transverse colon (2:48, 2:51). The abdominal defects for these hernias measure approximately 4.6 by 4.7 cm (2:47, 6:75) and 2.7 by 1.6 cm (2:49, 6:95). Just inferiorly there is a [place] paraumbilical hernia containing several loops of small bowel including the terminal ileum with an abdominal defect of 7.4 x 5 cm (6:78). Mesenteric fat stranding is noted within these hernias. IMPRESSION: 1. Extensive pancolitis with no associated bowel perforation or obstruction. No pneumatosis with however limited evaluation for bowel ischemia on this noncontrast study. Recommend correlation with stool cultures. 2. Total of 4 complex ventral wall hernias with the 3 most inferior ones containing small and Rewis bowel. Associated mesenteric fat stranding. Associated ischemia not excluded. Consider correlation with lactate levels. 3. Other  imaging findings of potential clinical significance: Hepatomegaly. Sequela of prior granulomatous disease. Aortic Atherosclerosis (ICD10-I70.0). These results were called by telephone at the time of interpretation on 08/30/2020 at 11:24 pm to provider Duffy Bruce , who verbally acknowledged these results. Electronically Signed   By: Iven Finn M.D.   On: 08/30/2020 23:23   DG Chest Port 1 View  Result Date: 08/30/2020 CLINICAL DATA:  Questionable sepsis. EXAM: PORTABLE CHEST 1 VIEW.  Patient is rotated. COMPARISON:  chest x-ray 05/29/2017 FINDINGS: Left chest wall cardiac pacemaker with 2 leads in grossly appropriate position. Slightly more prominent cardiac silhouette likely due to portable AP technique and patient rotation. The heart size and mediastinal contours are unchanged. Elevated left hemidiaphragm. Similar-appearing left hilar calcified lymph node. Redemonstration of a calcified granuloma at the left base. No focal consolidation. No pulmonary edema. No pleural effusion. No pneumothorax. No acute osseous abnormality. IMPRESSION: No active disease in a patient status post granulomatous disease involving the left lung. Electronically Signed   By: Iven Finn M.D.   On: 08/30/2020 22:45    ____________________________________________  PROCEDURES   Procedure(s) performed (including Critical Care):  .Critical Care Performed by: Duffy Bruce, MD Authorized by: Duffy Bruce, MD   Critical care provider statement:    Critical care time (minutes):  35   Critical care time was exclusive of:  Separately billable procedures and treating other patients and teaching time   Critical care was necessary to treat or prevent imminent or life-threatening deterioration of the following conditions:  Cardiac failure, circulatory failure, respiratory failure and sepsis   Critical care was time spent personally by me on the following activities:  Development of treatment plan with patient or  surrogate, discussions with consultants, evaluation of patient's response to treatment, examination of patient, obtaining history from patient or surrogate, ordering and performing treatments and interventions, ordering and review of laboratory studies, ordering and review of radiographic studies, pulse oximetry, re-evaluation of patient's condition and review of old charts   I assumed direction of critical care for  this patient from another provider in my specialty: no      ____________________________________________  INITIAL IMPRESSION / MDM / [place] / ED COURSE  As part of my medical decision making, I reviewed the following data within the [place] notes reviewed and incorporated, Old chart reviewed, Notes from prior ED visits, and [place] [employer] [place] was evaluated in Emergency Department on 08/31/2020 for the symptoms described in the history of present illness. She was evaluated in the context of the global COVID-19 pandemic, which necessitated consideration that the patient might be at risk for infection with the SARS-CoV-2 virus that causes COVID-19. Institutional protocols and algorithms that pertain to the evaluation of patients at risk for COVID-19 are in a state of rapid change based on information released by regulatory bodies including the CDC and federal and state organizations. These policies and algorithms were followed during the patient's care in the ED.  Some ED evaluations and interventions may be delayed as a result of limited staffing during the pandemic.*     Medical Decision Making:  65 yo F here with severe abd pain, diarrhea, vomiting. On arrival, pt hypotensive, in mild distress. Labs show profound leukocytosis, AKI with elevated Cr, and lactic acidosis. Sepsis protocol initiated. CT scan obtained, reviewed as above and shows significant pancolitis. Suspect C. Diff colitis in setting of recent ABX  use. Will check C. Diff panel, start empiric tx. Pt persistently hypotensive despite fluids, will start peripheral pressors and admit to ICU. Pt updated and in agreement.  ____________________________________________  FINAL CLINICAL IMPRESSION(S) / ED DIAGNOSES  Final diagnoses:  Pancolitis ([place])  AKI (acute kidney injury) ([place])  Lactic acidosis  Sepsis without acute organ dysfunction, due to unspecified organism ([place])     MEDICATIONS GIVEN DURING THIS VISIT:  Medications  vancomycin (VANCOCIN) 50 mg/mL oral solution 500 mg (has no administration in time range)  0.9 %  sodium chloride infusion ( Intravenous Infusion Verify 08/31/20 0100)  norepinephrine (LEVOPHED) 4mg  in 276mL premix infusion (3 mcg/min Intravenous Infusion Verify 08/31/20 0100)  pantoprazole (PROTONIX) injection 40 mg (has no administration in time range)  apixaban (ELIQUIS) tablet 5 mg (has no administration in time range)  flecainide (TAMBOCOR) tablet 100 mg (has no administration in time range)  levothyroxine (SYNTHROID) tablet 175 mcg (has no administration in time range)  oxyCODONE-acetaminophen (PERCOCET/ROXICET) 5-325 MG per tablet 1 tablet (has no administration in time range)  lactated ringers bolus 1,000 mL (0 mLs Intravenous Stopped 08/30/20 2321)    And  lactated ringers bolus 1,000 mL (0 mLs Intravenous Stopped 08/30/20 2321)    And  lactated ringers bolus 1,000 mL (0 mLs Intravenous Stopped 08/31/20 0002)    And  lactated ringers bolus 500 mL (0 mLs Intravenous Stopped 08/31/20 0004)  ceFEPIme (MAXIPIME) 2 g in sodium chloride 0.9 % 100 mL IVPB ( Intravenous Rate/Dose Verify 08/31/20 0100)  metroNIDAZOLE (FLAGYL) IVPB 500 mg (0 mg Intravenous Stopped 08/30/20 2341)  ondansetron (ZOFRAN) injection 4 mg (4 mg Intravenous Given 08/30/20 2316)  fentaNYL (SUBLIMAZE) injection 25 mcg (25 mcg Intravenous Given 08/30/20 2316)     ED Discharge Orders    None       Note:  This document was prepared using Dragon voice  recognition software and may include unintentional dictation errors.   Duffy Bruce, MD 08/31/20 [id number]

## 2020-08-30 NOTE — ED Notes (Signed)
X-ray at bedside

## 2020-08-30 NOTE — ED Notes (Signed)
ED Provider at bedside. 

## 2020-08-30 NOTE — ED Notes (Addendum)
Patient back from CT. Actively dry heaving. MD notified.

## 2020-08-31 DIAGNOSIS — Z20822 Contact with and (suspected) exposure to covid-19: Secondary | ICD-10-CM | POA: Diagnosis present

## 2020-08-31 DIAGNOSIS — Z6841 Body Mass Index (BMI) 40.0 and over, adult: Secondary | ICD-10-CM | POA: Diagnosis not present

## 2020-08-31 DIAGNOSIS — R6521 Severe sepsis with septic shock: Secondary | ICD-10-CM

## 2020-08-31 DIAGNOSIS — N182 Chronic kidney disease, stage 2 (mild): Secondary | ICD-10-CM | POA: Diagnosis present

## 2020-08-31 DIAGNOSIS — E860 Dehydration: Secondary | ICD-10-CM | POA: Diagnosis present

## 2020-08-31 DIAGNOSIS — I495 Sick sinus syndrome: Secondary | ICD-10-CM | POA: Diagnosis present

## 2020-08-31 DIAGNOSIS — R571 Hypovolemic shock: Secondary | ICD-10-CM | POA: Diagnosis present

## 2020-08-31 DIAGNOSIS — A419 Sepsis, unspecified organism: Secondary | ICD-10-CM | POA: Diagnosis present

## 2020-08-31 DIAGNOSIS — R7303 Prediabetes: Secondary | ICD-10-CM | POA: Diagnosis present

## 2020-08-31 DIAGNOSIS — Z79899 Other long term (current) drug therapy: Secondary | ICD-10-CM | POA: Diagnosis not present

## 2020-08-31 DIAGNOSIS — I129 Hypertensive chronic kidney disease with stage 1 through stage 4 chronic kidney disease, or unspecified chronic kidney disease: Secondary | ICD-10-CM | POA: Diagnosis present

## 2020-08-31 DIAGNOSIS — K5100 Ulcerative (chronic) pancolitis without complications: Secondary | ICD-10-CM | POA: Diagnosis not present

## 2020-08-31 DIAGNOSIS — Z880 Allergy status to penicillin: Secondary | ICD-10-CM | POA: Diagnosis not present

## 2020-08-31 DIAGNOSIS — N179 Acute kidney failure, unspecified: Secondary | ICD-10-CM | POA: Diagnosis present

## 2020-08-31 DIAGNOSIS — E872 Acidosis: Secondary | ICD-10-CM | POA: Diagnosis not present

## 2020-08-31 DIAGNOSIS — A0472 Enterocolitis due to Clostridium difficile, not specified as recurrent: Secondary | ICD-10-CM | POA: Diagnosis present

## 2020-08-31 DIAGNOSIS — D631 Anemia in chronic kidney disease: Secondary | ICD-10-CM | POA: Diagnosis present

## 2020-08-31 DIAGNOSIS — I480 Paroxysmal atrial fibrillation: Secondary | ICD-10-CM | POA: Diagnosis present

## 2020-08-31 DIAGNOSIS — R509 Fever, unspecified: Secondary | ICD-10-CM | POA: Diagnosis present

## 2020-08-31 DIAGNOSIS — E039 Hypothyroidism, unspecified: Secondary | ICD-10-CM | POA: Diagnosis present

## 2020-08-31 DIAGNOSIS — K219 Gastro-esophageal reflux disease without esophagitis: Secondary | ICD-10-CM | POA: Diagnosis present

## 2020-08-31 DIAGNOSIS — I712 Thoracic aortic aneurysm, without rupture: Secondary | ICD-10-CM | POA: Diagnosis present

## 2020-08-31 DIAGNOSIS — Z888 Allergy status to other drugs, medicaments and biological substances status: Secondary | ICD-10-CM | POA: Diagnosis not present

## 2020-08-31 DIAGNOSIS — E871 Hypo-osmolality and hyponatremia: Secondary | ICD-10-CM | POA: Diagnosis not present

## 2020-08-31 DIAGNOSIS — A4189 Other specified sepsis: Secondary | ICD-10-CM | POA: Diagnosis present

## 2020-08-31 DIAGNOSIS — M109 Gout, unspecified: Secondary | ICD-10-CM | POA: Diagnosis present

## 2020-08-31 DIAGNOSIS — E861 Hypovolemia: Secondary | ICD-10-CM | POA: Diagnosis present

## 2020-08-31 LAB — CBC
HCT: 31.7 % — ABNORMAL LOW (ref 36.0–46.0)
Hemoglobin: 10.7 g/dL — ABNORMAL LOW (ref 12.0–15.0)
MCH: 33.9 pg (ref 26.0–34.0)
MCHC: 33.8 g/dL (ref 30.0–36.0)
MCV: 100.3 fL — ABNORMAL HIGH (ref 80.0–100.0)
Platelets: 266 10*3/uL (ref 150–400)
RBC: 3.16 MIL/uL — ABNORMAL LOW (ref 3.87–5.11)
RDW: 13.8 % (ref 11.5–15.5)
WBC: 34.2 10*3/uL — ABNORMAL HIGH (ref 4.0–10.5)
nRBC: 0 % (ref 0.0–0.2)

## 2020-08-31 LAB — GASTROINTESTINAL PANEL BY PCR, STOOL (REPLACES STOOL CULTURE)

## 2020-08-31 LAB — URINALYSIS, COMPLETE (UACMP) WITH MICROSCOPIC
Bacteria, UA: NONE SEEN
Bilirubin Urine: NEGATIVE
Glucose, UA: NEGATIVE mg/dL
Hgb urine dipstick: NEGATIVE
Ketones, ur: NEGATIVE mg/dL
Leukocytes,Ua: NEGATIVE
Nitrite: NEGATIVE
Protein, ur: NEGATIVE mg/dL
Specific Gravity, Urine: 1.015 (ref 1.005–1.030)
pH: 5 (ref 5.0–8.0)

## 2020-08-31 LAB — PHOSPHORUS
Phosphorus: 3.8 mg/dL (ref 2.5–4.6)
Phosphorus: 4.5 mg/dL (ref 2.5–4.6)

## 2020-08-31 LAB — MAGNESIUM
Magnesium: 1.5 mg/dL — ABNORMAL LOW (ref 1.7–2.4)
Magnesium: 2.4 mg/dL (ref 1.7–2.4)

## 2020-08-31 LAB — BASIC METABOLIC PANEL
Anion gap: 7 (ref 5–15)
BUN: 43 mg/dL — ABNORMAL HIGH (ref 8–23)
CO2: 21 mmol/L — ABNORMAL LOW (ref 22–32)
Calcium: 8.1 mg/dL — ABNORMAL LOW (ref 8.9–10.3)
Chloride: 103 mmol/L (ref 98–111)
Creatinine, Ser: 1.97 mg/dL — ABNORMAL HIGH (ref 0.44–1.00)
GFR, Estimated: 28 mL/min — ABNORMAL LOW (ref >60)
Glucose, Bld: 131 mg/dL — ABNORMAL HIGH (ref 70–99)
Potassium: 4.3 mmol/L (ref 3.5–5.1)
Sodium: 131 mmol/L — ABNORMAL LOW (ref 135–145)

## 2020-08-31 LAB — HEMOGLOBIN A1C
Hgb A1c MFr Bld: 4.7 % — ABNORMAL LOW (ref 4.8–5.6)
Mean Plasma Glucose: 88.19 mg/dL

## 2020-08-31 LAB — GLUCOSE, CAPILLARY
Glucose-Capillary: 104 mg/dL — ABNORMAL HIGH (ref 70–99)
Glucose-Capillary: 107 mg/dL — ABNORMAL HIGH (ref 70–99)
Glucose-Capillary: 109 mg/dL — ABNORMAL HIGH (ref 70–99)
Glucose-Capillary: 117 mg/dL — ABNORMAL HIGH (ref 70–99)
Glucose-Capillary: 118 mg/dL — ABNORMAL HIGH (ref 70–99)
Glucose-Capillary: 130 mg/dL — ABNORMAL HIGH (ref 70–99)

## 2020-08-31 LAB — C DIFFICILE QUICK SCREEN W PCR REFLEX
C Diff antigen: POSITIVE — AB
C Diff interpretation: DETECTED
C Diff toxin: POSITIVE — AB

## 2020-08-31 LAB — LACTIC ACID, PLASMA
Lactic Acid, Venous: 2 mmol/L (ref 0.5–1.9)
Lactic Acid, Venous: 1.6 mmol/L (ref 0.5–1.9)

## 2020-08-31 LAB — HIV ANTIBODY (ROUTINE TESTING W REFLEX): HIV Screen 4th Generation wRfx: NONREACTIVE

## 2020-08-31 LAB — MRSA PCR SCREENING: MRSA by PCR: NEGATIVE

## 2020-08-31 MED ORDER — MAGNESIUM SULFATE 4 GM/100ML IV SOLN
4.0000 g | Freq: Once | INTRAVENOUS | Status: AC
  Administered 2020-08-31: 4 g via INTRAVENOUS
  Filled 2020-08-31: qty 100

## 2020-08-31 MED ORDER — HEPARIN SODIUM (PORCINE) 5000 UNIT/ML IJ SOLN: 5000.0000 [IU] | Freq: Three times a day (TID) | INTRAMUSCULAR | Status: DC

## 2020-08-31 MED ORDER — APIXABAN 5 MG PO TABS
5.0000 mg | ORAL_TABLET | Freq: Two times a day (BID) | ORAL | Status: DC
  Administered 2020-08-31 – 2020-09-12 (×25): 5 mg via ORAL
  Filled 2020-08-31 (×25): qty 1

## 2020-08-31 MED ORDER — CHLORHEXIDINE GLUCONATE CLOTH 2 % EX PADS
6.0000 | MEDICATED_PAD | Freq: Every day | CUTANEOUS | Status: DC
  Administered 2020-09-02 – 2020-09-03 (×2): 6 via TOPICAL

## 2020-08-31 MED ORDER — ACETAMINOPHEN 325 MG PO TABS
650.0000 mg | ORAL_TABLET | ORAL | Status: DC | PRN
  Administered 2020-08-31 – 2020-09-10 (×14): 650 mg via ORAL
  Filled 2020-08-31 (×16): qty 2

## 2020-08-31 MED ORDER — METRONIDAZOLE 500 MG/100ML IV SOLN
500.0000 mg | Freq: Three times a day (TID) | INTRAVENOUS | Status: DC
  Administered 2020-08-31 – 2020-09-02 (×7): 500 mg via INTRAVENOUS
  Filled 2020-08-31 (×10): qty 100

## 2020-08-31 MED ORDER — SODIUM CHLORIDE 0.9 % IV SOLN: INTRAVENOUS | Status: DC

## 2020-08-31 MED ORDER — LACTATED RINGERS IV BOLUS
1000.0000 mL | Freq: Once | INTRAVENOUS | Status: AC
  Administered 2020-08-31: 1000 mL via INTRAVENOUS

## 2020-08-31 MED ORDER — FLECAINIDE ACETATE 100 MG PO TABS
100.0000 mg | ORAL_TABLET | Freq: Two times a day (BID) | ORAL | Status: DC
  Administered 2020-08-31 – 2020-09-12 (×25): 100 mg via ORAL
  Filled 2020-08-31 (×27): qty 1

## 2020-08-31 MED ORDER — PANTOPRAZOLE SODIUM 40 MG IV SOLR
40.0000 mg | Freq: Every day | INTRAVENOUS | Status: DC
  Administered 2020-08-31 – 2020-09-01 (×3): 40 mg via INTRAVENOUS
  Filled 2020-08-31 (×3): qty 40

## 2020-08-31 MED ORDER — NOREPINEPHRINE 4 MG/250ML-% IV SOLN
0.0000 ug/min | INTRAVENOUS | Status: DC
  Administered 2020-08-31: 9 ug/min via INTRAVENOUS
  Administered 2020-08-31: 2 ug/min via INTRAVENOUS
  Administered 2020-08-31: 9 ug/min via INTRAVENOUS
  Filled 2020-08-31 (×3): qty 250

## 2020-08-31 MED ORDER — OXYCODONE-ACETAMINOPHEN 5-325 MG PO TABS
1.0000 | ORAL_TABLET | Freq: Four times a day (QID) | ORAL | Status: DC | PRN
  Filled 2020-08-31 (×2): qty 1

## 2020-08-31 MED ORDER — POLYETHYLENE GLYCOL 3350 17 G PO PACK: 17.0000 g | PACK | Freq: Every day | ORAL | Status: DC | PRN

## 2020-08-31 MED ORDER — LEVOTHYROXINE SODIUM 50 MCG PO TABS
175.0000 ug | ORAL_TABLET | Freq: Every day | ORAL | Status: DC
  Administered 2020-08-31 – 2020-09-12 (×13): 175 ug via ORAL
  Filled 2020-08-31: qty 4
  Filled 2020-08-31 (×3): qty 1
  Filled 2020-08-31: qty 4
  Filled 2020-08-31 (×2): qty 1
  Filled 2020-08-31: qty 4
  Filled 2020-08-31 (×5): qty 1

## 2020-08-31 MED ORDER — SODIUM CHLORIDE 0.9 % IV SOLN
2.0000 g | Freq: Two times a day (BID) | INTRAVENOUS | Status: DC
  Filled 2020-08-31: qty 2

## 2020-08-31 MED ORDER — DOCUSATE SODIUM 100 MG PO CAPS: 100.0000 mg | ORAL_CAPSULE | Freq: Two times a day (BID) | ORAL | Status: DC | PRN

## 2020-08-31 NOTE — H&P (Addendum)
NAME:  [name] [name], MRN:  [id number], DOB:  July 05, 1955, LOS: 0 ADMISSION DATE:  08/30/2020, CONSULTATION DATE:  08/31/20 REFERRING MD:  Dr. Ellender Hose, CHIEF COMPLAINT:  Fever and Diarrhea   History of Present Illness:  65 yo female presenting to the [facility] ED from home with complaints of fevers, abdominal pain & diarrhea. The patient reported having been recently discharged from [facility] one week ago for cellulitis, at which time she received clindamycin & vancomycin IV. Prior to admission she had received a prescription from her PCP for keflex. Patient reports since being home she has had progressively worsening aching/gnawing, generalized abdominal pain, intermittent cramping with the inability to eat/drink. She also reports profuse, non-bloody, but foul-smelling diarrhea. ED course: Sepsis protocol initiated with IVF resuscitation and empiric antibiotics given with high suspicion for C. Diff colitis. Abdominal CT revealing pan colitis without perforation or obstruction. Patient's BP remain soft after 3.5 L of IVF and peripheral levophed initiated. Initial Vitals: 99.7, tachypneic at 24, NSR at 64, BP soft at 93/51 (62) & SpO2 96% on RA. Significant labs: Na+ 130, acidotic- CO2, AKI- BUN/Cr: 42/2.15, Mag: 1.5, albumin-2.5, lactic acidosis-2.8, leukocytosis-29.1  Due to vasopressor requirement, PCCM consulted for admission. 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 08/31/20- Admit to ICU with Septic Shock due to suspected C-difficile infection & associated hypovolemia requiring vasopressors.  Interim History / Subjective:   Patient alert and pleasant resting comfortably in bed. She reports feeling weak with the 6 days of diarrhea and having poor PO intake these last few days. Confirmed that she has been taking all medications as prescribed at home. Discussed plan of care including C.diff testing and treatment. All questions and concerns answered at this time.  Objective   Blood pressure (!) 103/50, pulse 69, temperature 100.3 F (37.9 C), temperature source Oral, resp. rate (!) 22, height 5\' 8"  (1.727 m), weight 104.2 kg, SpO2 97 %.        Intake/Output Summary (Last 24 hours) at 08/31/2020 0041 Last data filed at 08/31/2020 0004 Gross per 24 hour  Intake 4500 ml  Output --  Net 4500 ml   Filed Weights   08/30/20 2157  Weight: 104.2 kg    Examination: General: Adult female, critically ill, lying in bed, NAD HEENT: MM pink/moist, anicteric, atraumatic, neck supple, glasses in place Neuro: A&O x 4, able to follow commands, PERRL +3, MAE CV: s1s2 RRR, NSR on monitor, +known murmur, no r/g Pulm: Regular, non labored on room air , breath sounds clear-BUL & clear/diminished-BLL GI: soft, rounded, non tender to palpation, bs x 4 Skin: recent fall with injury, healing forehead abrasion & R knee with healing hematoma/swelling/abrasion Extremities: warm/dry, pulses + 2 R/P, trace edema noted  Labs/imaging that I have personally reviewed  (right click and "Reselect all SmartList Selections" daily)  EKG Interpretation Date: 08/30/20 EKG Time: 22:04 Rate: 64 Rhythm: NSR QRS Axis:  normal Intervals: mildly prolonged Qtc at 460 ST/T Wave abnormalities: none Narrative Interpretation: NSR  08/30/20 CT abdomen/pelvis >> Extensive pancolitis with no associated bowel perforation or obstruction. No pneumatosis with however limited evaluation for bowel ischemia on this noncontrast study. Total of 4 complex ventral wall hernias with the  3 most inferior ones containing small and [place] bowel. Associated mesenteric fat  stranding. Associated ischemia not excluded. Hepatomegaly. Sequela of prior granulomatous disease. Aortic Atherosclerosis.  Na+/ K+: 130/ 4.1 BUN/Cr.: 42/2.15 Serum CO2/ AG: 19/10  Hgb: 10.8 WBC/ TMAX: 29.1/ 100.3 Lactic/ PCT: 2.8/pending  CXR 08/30/20: No active disease in a patient status post granulomatous disease involving the left lung Resolved Hospital Problem list     Assessment & Plan:  Sepsis with septic shock due to suspected C-Difficile colitis & associated hypovolemia Lactic: 2.8, Baseline PCT: pending, UA: pending, CXR: no active disease, s/p granulomatous disease involving left lung, CT abdomen/pelvis : Extensive pan colitis with no associated perforation/obstruction. Initial interventions/workup included: 3.5 L of LR & Cefepime/ Vancomycin/ Metronidazole - Supplemental oxygen as needed, to maintain SpO2 > 90% - f/u cultures, trend lactic/ PCT - C.Diff & GI panel pending - Daily CBC - monitor WBC/ fever curve - IV antibiotics: cefepime & vancomycin & flagyl for intra-abdominal empiric coverage while waiting on C. Diff results - IVF hydration as needed: initiate NS IVF @ 100 mL/h - Continue peripheral vasopressors to maintain MAP< 65, norepinephrine - Strict I/O's: alert provider if UOP < 0.5 mL/kg/hr - Persistent hypotension consider stress dose steroids   Acute Kidney Injury on CKD Stage 2 in the setting of hypovolemic & septic shock Metabolic Acidosis Baseline Cr: 0.9, Cr on admission:2.15 - Strict I/O's: alert provider if UOP < 0.5 mL/kg/hr - gentle IVF hydration  - Daily BMP, replace electrolytes PRN - Avoid nephrotoxic agents as able, ensure adequate renal perfusion - consider renal US  Paroxsymal Atrial Fibrillation Thoracic Aortic Aneurysm- last monitored 10/21 (4.4cm) PMHx: Tachy-Brady Arrhythmia, PPM - continue home regimen: Eliquis, flecainide - ordered 4 g of Mag for replacement, maintain Mg > 2/ K+ >4 - carvedilol on hold due to vasopressor need,  restart once off of pressor support - continuous cardiac monitoring  Hypothyroidism - continue home synthroid  Best practice (right click and "Reselect all SmartList Selections" daily)  Diet:  NPO Pain/Anxiety/Delirium protocol (if indicated): No VAP protocol (if indicated): Not indicated DVT prophylaxis: Systemic AC GI prophylaxis: PPI Glucose control:  SSI No Central venous access:  N/A Arterial line:  N/A Foley:  N/A Mobility:  bed rest  PT consulted: N/A Last date of multidisciplinary goals of care discussion 08/30/20 Code Status:  full code Disposition: ICU  Labs   CBC: Recent Labs  Lab 08/30/20 2212  WBC 29.1*  NEUTROABS 25.1*  HGB 10.8*  HCT 32.3*  MCV 100.6*  PLT 387    Basic Metabolic Panel: Recent Labs  Lab 08/30/20 2212  NA 130*  K 4.1  CL 101  CO2 19*  GLUCOSE 125*  BUN 42*  CREATININE 2.15*  CALCIUM 8.2*   GFR: Estimated Creatinine Clearance: 33.4 mL/min (A) (by C-G formula based on SCr of 2.15 mg/dL (H)). Recent Labs  Lab 08/30/20 2212 08/31/20 0009  WBC 29.1*  --   LATICACIDVEN  --  2.8*    Liver Function Tests: Recent Labs  Lab 08/30/20 2212  AST 21  ALT 12  ALKPHOS 105  BILITOT 0.8  PROT 4.9*  ALBUMIN 2.5*   No results for input(s): LIPASE, AMYLASE in the last 168 hours. No results for input(s): AMMONIA in the last 168 hours.  ABG No results found for: PHART, PCO2ART, PO2ART, HCO3, TCO2, ACIDBASEDEF, O2SAT   Coagulation Profile: Recent Labs  Lab 08/30/20 2212  INR 1.3*    Cardiac Enzymes: No results for input(s): CKTOTAL, CKMB, CKMBINDEX,  TROPONINI in the last 168 hours.  HbA1C: No results found for: HGBA1C  CBG: No results for input(s): GLUCAP in the last 168 hours.  Review of Systems: Positives in BOLD  Gen: Denies fever, chills, weight change, fatigue, night sweats HEENT: Denies blurred vision, double vision, hearing loss, tinnitus, sinus congestion, rhinorrhea, sore throat, neck stiffness, dysphagia PULM:  Denies shortness of breath, cough, sputum production, hemoptysis, wheezing CV: Denies chest pain, edema, orthopnea, paroxysmal nocturnal dyspnea, palpitations GI: Denies abdominal pain, nausea, vomiting, diarrhea, hematochezia, melena, constipation, change in bowel habits GU: Denies dysuria, hematuria, polyuria, oliguria, urethral discharge Endocrine: Denies hot or cold intolerance, polyuria, polyphagia or appetite change Derm: Denies rash, dry skin, scaling or peeling skin change Heme: Denies easy bruising, bleeding, bleeding gums Neuro: Denies headache, numbness, weakness, slurred speech, loss of memory or consciousness  Past Medical History:  She,  has a past medical history of Anemia, Arrhythmia, Atrial fibrillation ([place]), Cancer ([place]), Endometriosis, GERD (gastroesophageal reflux disease), Gout, Heart disease, Hypothyroidism, Obesity, Prediabetes, Renal disorder, and Ventricular hypertrophy.   Surgical History:   Past Surgical History:  Procedure Laterality Date  . ABDOMINAL HYSTERECTOMY    . ADENOIDECTOMY    . DILATION AND CURETTAGE OF UTERUS    . TONSILLECTOMY       Social History:   reports that she has never smoked. She has never used smokeless tobacco. She reports that she does not drink alcohol and does not use drugs.   Family History:  Her family history includes Diabetes in her mother; Heart attack in her father; Hypertension in her mother.   Allergies Allergies  Allergen Reactions  . Amlodipine Other (See Comments)    dizziness  . Penicillins Itching  . Tape Rash     Home Medications  Prior to Admission medications   Medication Sig Start Date End Date Taking? Authorizing Provider  allopurinol (ZYLOPRIM) 100 MG tablet Take 100 mg by mouth 2 (two) times daily.   Yes [provider]  carvedilol (COREG) 25 MG tablet Take 37.5 mg by mouth 2 (two) times daily. 06/11/20  Yes [provider]  flecainide (TAMBOCOR) 100 MG tablet Take 100 mg by mouth 2  (two) times daily. 08/13/20  Yes [provider]  furosemide (LASIX) 40 MG tablet Take 40 mg by mouth daily. 08/12/20  Yes [provider]  hydrALAZINE (APRESOLINE) 50 MG tablet Take 50 mg by mouth 2 (two) times daily. 05/08/20  Yes [provider]  levothyroxine (SYNTHROID, LEVOTHROID) 175 MCG tablet Take 175 mcg by mouth daily before breakfast.   Yes [provider]  losartan (COZAAR) 100 MG tablet Take by mouth. 09/11/17 08/30/20 Yes [provider]  methocarbamol (ROBAXIN) 500 MG tablet Take 1 tablet by mouth 3 (three) times daily. 08/13/20  Yes [provider]  metoCLOPramide (REGLAN) 5 MG tablet Take 5 mg by mouth 2 (two) times daily. 08/27/20  Yes [provider]  omeprazole (PRILOSEC) 20 MG capsule Take 1 capsule by mouth daily. 06/17/20  Yes [provider]  oxyCODONE (OXY IR/ROXICODONE) 5 MG immediate release tablet Take 5 mg by mouth every 4 (four) hours as needed. 08/07/20  Yes [provider]  oxyCODONE-acetaminophen (PERCOCET/ROXICET) 5-325 MG tablet Take 1 tablet by mouth every 8 (eight) hours as needed. 08/17/20  Yes [provider]  Potassium Chloride ER 20 MEQ TBCR Take 1 tablet by mouth daily. 06/22/20  Yes [provider]  tiZANidine (ZANAFLEX) 2 MG tablet Take 2 mg by mouth 3 (three) times daily.  04/27/20  Yes [provider]  cephALEXin (KEFLEX) 500 MG capsule Take 500 mg by mouth every 6 (six) hours. Patient not taking: Reported on 08/30/2020 08/11/20   [provider]  colchicine 0.6 MG tablet Take 0.6 mg by mouth as needed.    [provider]  ELIQUIS 5 MG TABS tablet Take 1 tablet by mouth 2 (two) times daily. Patient not taking: No sig reported 08/08/20   [provider]     Critical care time: 55 minutes       Venetia Night, AGACNP-BC Acute Care Nurse Practitioner [facility]   [phone] / [phone] Please  see Amion for pager details.

## 2020-08-31 NOTE — Progress Notes (Signed)
Pharmacy Antibiotic Note  [name] [name] is a 65 y.o. female admitted on 08/30/2020 with sepsis.  Pharmacy has been consulted for Cefepime dosing.  Plan: Ordered Cefepime 2 gm q12h per indication and renal fxn.  Pharmacy will continue to follow and adjust abx dosing if warranted.  Pt positive for C. Diff.  Cefepime consult discontinued prior to completion of this note. Pt currently ordered oral vancomycin and metronidazole.  Thank you for allowing pharmacy to be a part of this patient's care.  Renda Rolls, PharmD, MBA 08/31/2020 2:16 AM

## 2020-08-31 NOTE — Progress Notes (Signed)
Pt being followed by [facility] for sepsis protocol. 

## 2020-08-31 NOTE — Progress Notes (Signed)
[facility]Link Physician-Brief Progress Note Patient Name: [name] [name] DOB: 1955/06/13 MRN: [id number]   Date of Service  08/31/2020  HPI/Events of Note  12F with hx of Afib (on flecainide + Eliquis), CKD, HTN, and s/p PPM (for tachy-brady syndrome) who p/w fever, abd pain + non-bloody diarrhea. Recent ABX exposure (clinda + vanc for cellulitis).  She was found to be hypotensive to 81/51 in the ED and CT Abd/Pelvis showed an extensive pancolitis without any e/o pneumatosis (albeit non-contrast study).  She received 4L IVF resuscitation and received cefepime + IV flagyl.  WBC 29k. Lactate 2.8. Na 130. Cr 2.15.  On exam, the patient is awake and alert and oriented x3. Readily conversant. Her BP is 90/63 (MAP 70) on levophed at 3 mcg/min. HR 71 (NSR), 96% on room air. Easy work of breathing.   eICU Interventions  # Cardiac: - Septic and hypovolemic shock (suspected secondary to severe C.difficile infection): PO vanc + IV flagyl - Continue mIVF + support MAP 65 using levophed drip. - Repeat lactate to ensure downtrends/normalizes. - Afib: Continue apixaban.  # ID: - F/u BCx and UA/UCx and C.difficile PCR. High suspicion for C.difficile infection given pancolitis on CT and recent ABX exposure. - Empiric cefepime (broad coverage for sepsis) + PO vanc and IV Flagyl (C.difficile coverage) for now pending culture/PCR results.  # Renal: - AKI on CKD: S/p volume resuscitation. Supportive care with treatment of colitis / sepsis.  Remainder of issues per NP note.  DVT PPX: Apixaban. GI PPX: Home protonix.     Intervention Category Evaluation Type: New Patient Evaluation  [name] [name] [name] 08/31/2020, 1:30 AM

## 2020-08-31 NOTE — Progress Notes (Signed)
PHARMACY CONSULT NOTE - FOLLOW UP  Pharmacy Consult for Electrolyte Monitoring and Replacement   Recent Labs: Potassium (mmol/L)  Date Value  08/31/2020 4.3   Magnesium (mg/dL)  Date Value  08/31/2020 2.4   Calcium (mg/dL)  Date Value  08/31/2020 8.1 (L)   Albumin (g/dL)  Date Value  08/30/2020 2.5 (L)   Phosphorus (mg/dL)  Date Value  08/31/2020 4.5   Sodium (mmol/L)  Date Value  08/31/2020 131 (L)     Assessment:  65 y.o. female with PMH for paroxysmal afib (on flecainide and Eliquis), hypothyroidism, HTN, CKD Stage 2, tachy-brady syndrome, permanent pacemaker (2019) here with abdominal pain, diarrhea, fevers. The patient reported having been recently discharged from [facility] one week ago for cellulitis, at which time she received clindamycin & vancomycin IV. Prior to admission she had received a prescription from her PCP for keflex. Patient reports since being home she has had progressively worsening aching/gnawing, generalized abdominal pain, intermittent cramping with the inability to eat/drink. She also reports profuse, non-bloody, but foul-smelling diarrhea. Pharmacy has been consulted for management of electrolytes.   MIVR: 0.9% NS @100ml /hr  Goal of Therapy:  K 4.0 -5.1 mmol/L Mg 2.0 -2.4 mg/dL  Plan:   Na 131 - on 0.9% NS @100ml /hr, will continue to monitor  Re-check electrolytes with AM labs  Sherilyn Banker, PharmD Pharmacy Resident  08/31/2020 6:36 AM

## 2020-09-01 ENCOUNTER — Inpatient Hospital Stay: Payer: Worker's Compensation

## 2020-09-01 DIAGNOSIS — K5100 Ulcerative (chronic) pancolitis without complications: Secondary | ICD-10-CM

## 2020-09-01 LAB — BASIC METABOLIC PANEL
Anion gap: 8 (ref 5–15)
BUN: 44 mg/dL — ABNORMAL HIGH (ref 8–23)
CO2: 20 mmol/L — ABNORMAL LOW (ref 22–32)
Calcium: 7.6 mg/dL — ABNORMAL LOW (ref 8.9–10.3)
Chloride: 103 mmol/L (ref 98–111)
Creatinine, Ser: 1.54 mg/dL — ABNORMAL HIGH (ref 0.44–1.00)
GFR, Estimated: 37 mL/min — ABNORMAL LOW (ref >60)
Glucose, Bld: 113 mg/dL — ABNORMAL HIGH (ref 70–99)
Potassium: 4.3 mmol/L (ref 3.5–5.1)
Sodium: 131 mmol/L — ABNORMAL LOW (ref 135–145)

## 2020-09-01 LAB — MAGNESIUM: Magnesium: 2.2 mg/dL (ref 1.7–2.4)

## 2020-09-01 LAB — CBC
HCT: 31.6 % — ABNORMAL LOW (ref 36.0–46.0)
Hemoglobin: 10.4 g/dL — ABNORMAL LOW (ref 12.0–15.0)
MCH: 33.1 pg (ref 26.0–34.0)
MCHC: 32.9 g/dL (ref 30.0–36.0)
MCV: 100.6 fL — ABNORMAL HIGH (ref 80.0–100.0)
Platelets: 288 10*3/uL (ref 150–400)
RBC: 3.14 MIL/uL — ABNORMAL LOW (ref 3.87–5.11)
RDW: 14 % (ref 11.5–15.5)
WBC: 40.3 10*3/uL — ABNORMAL HIGH (ref 4.0–10.5)
nRBC: 0 % (ref 0.0–0.2)

## 2020-09-01 LAB — LACTIC ACID, PLASMA
Lactic Acid, Venous: 1.8 mmol/L (ref 0.5–1.9)
Lactic Acid, Venous: 1.6 mmol/L (ref 0.5–1.9)

## 2020-09-01 LAB — GLUCOSE, CAPILLARY
Glucose-Capillary: 98 mg/dL (ref 70–99)
Glucose-Capillary: 90 mg/dL (ref 70–99)
Glucose-Capillary: 95 mg/dL (ref 70–99)
Glucose-Capillary: 84 mg/dL (ref 70–99)
Glucose-Capillary: 108 mg/dL — ABNORMAL HIGH (ref 70–99)

## 2020-09-01 LAB — PROCALCITONIN: Procalcitonin: 1.66 ng/mL

## 2020-09-01 LAB — PHOSPHORUS: Phosphorus: 4 mg/dL (ref 2.5–4.6)

## 2020-09-01 MED ORDER — ONDANSETRON HCL 4 MG/2ML IJ SOLN
4.0000 mg | Freq: Three times a day (TID) | INTRAMUSCULAR | Status: DC | PRN
Start: 2020-09-01 — End: 2020-09-12
  Administered 2020-09-02 (×2): 4 mg via INTRAVENOUS
  Administered 2020-09-03: 8 mg via INTRAVENOUS
  Filled 2020-09-01: qty 4
  Filled 2020-09-01 (×2): qty 2

## 2020-09-01 MED ORDER — SODIUM CHLORIDE 0.9 % IV SOLN
12.5000 mg | Freq: Four times a day (QID) | INTRAVENOUS | Status: DC | PRN
  Filled 2020-09-01: qty 0.5

## 2020-09-01 NOTE — Progress Notes (Addendum)
NAME:  [name] [name], MRN:  [id number], DOB:  Nov 09, 1955, LOS: 1 ADMISSION DATE:  08/30/2020, CONSULTATION DATE:  08/31/20 REFERRING MD:  Dr. Ellender Hose, CHIEF COMPLAINT:  Fever and Diarrhea   Brief Pt Description/Synopsis:  65 y.o. Female admitted with Septic and Hypovolemic shock in the setting of C-Difficile colitis.  History of Present Illness:  65 yo female presenting to the [facility] ED from home with complaints of fevers, abdominal pain & diarrhea. The patient reported having been recently discharged from [facility] one week ago for cellulitis, at which time she received clindamycin & vancomycin IV. Prior to admission she had received a prescription from her PCP for keflex. Patient reports since being home she has had progressively worsening aching/gnawing, generalized abdominal pain, intermittent cramping with the inability to eat/drink. She also reports profuse, non-bloody, but foul-smelling diarrhea. ED course: Sepsis protocol initiated with IVF resuscitation and empiric antibiotics given with high suspicion for C. Diff colitis. Abdominal CT revealing pan colitis without perforation or obstruction. Patient's BP remain soft after 3.5 L of IVF and peripheral levophed initiated. Initial Vitals: 99.7, tachypneic at 24, NSR at 64, BP soft at 93/51 (62) & SpO2 96% on RA. Significant labs: Na+ 130, acidotic- CO2, AKI- BUN/Cr: 42/2.15, Mag: 1.5, albumin-2.5, lactic acidosis-2.8, leukocytosis-29.1  Due to vasopressor requirement, PCCM consulted for admission. 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 08/31/20- Admit to ICU with Septic Shock due to suspected C-difficile infection & associated hypovolemia requiring vasopressors.  09/01/20- Vasopressors weaned off.  WBC increased to 40.3, addomen slighlty distended and tender to palpation, repeat CT Abdomen & Pelvis Diffuse w/ colonic wall thickening again noted, similar to prior study compatible with pancolitis. Diffuse sigmoiddiverticulosis. Stomach and small bowel decompressed, unremarkable.  No obstruction or free air.   Interim History / Subjective:  -No acute events reported overnight -Afebrile, hemodynamically stable, Vasopressors weaned off -Lactic acid normalized yesterday -WBC increased to 40.3 today, will repeat lactic acid, & repeat CT Abdomen & Pelvis ~ "Diffuse colonic wall thickening again noted, similar to prior study compatible with pancolitis. Diffuse sigmoid diverticulosis. Stomach and small bowel decompressed, unremarkable." No free air or obstruction, hernias similar to prior. -Creatinine slowly improving to 1.53, urine output 900 ml over last 24 hrs (+9.3L since admit) -Vasopressors weaned off earlier this morning, current BP 97/70 (MAP 79) -Reports intermittent nausea, abdominal cramping, tenderness to palpation, continues to have diarrhea -Denies chest pain, SOB, fever, chills, HA, dizziness -Tolerating small bites of breakfast  Objective   Blood pressure (!) 105/56, pulse 68, temperature 98.8 F (37.1 C), temperature source Oral, resp. rate 20, height 5\' 8"  (1.727 m), weight 110.8 kg, SpO2 96 %.        Intake/Output Summary (Last 24 hours) at 09/01/2020 0840 Last data filed at 09/01/2020 0600 Gross per 24 hour  Intake 5256.24 ml  Output 900 ml  Net 4356.24  ml   Filed Weights   08/31/20 0110 08/31/20 0500 09/01/20 0500  Weight: 109.7  kg 104.2 kg 110.8 kg    Examination: General: Acutely ill appearing female, sitting in bed, tolerating small bites of breakfast, on room air, in NAD HEENT: Atraumatic, normocephalic, neck supple, no JVD, mucous membranes moist Neuro: Awake, alert and oriented x4, moves all extremities to commands, no focal deficits, speech clear, pupils PERRLA CV: Regular rate rhythm, S1-S2, no murmurs, rubs, gallops, 2+ distal pulses Pulm: Clear diminished to auscultation bilaterally, no wheezing or rales noted, even, nonlabored, normal effort GI: Slightly taut and distended, tender to palpation, no guarding or rebound tenderness, bowel sounds positive x4 Skin: recent fall with injury, healing forehead abrasion & R knee with healing hematoma/swelling/abrasion Extremities: warm/dry, pulses + 2 R/P, trace edema noted  Labs/imaging that I have personally reviewed  (right click and "Reselect all SmartList Selections" daily)  Labs 09/01/20: Na 131, BUN 44, Cr. 1.54, WBC 40.3, Hgb 10.4 09/01/20: CT Abdomen/Pelvis>>Multiple ventral hernias as described above, containing portions of the transverse colon and multiple small bowel loops. No evidence of bowel obstruction. Continued wall thickening throughout the colon compatible with Pancolitis. Sigmoid diverticulosis.Old granunomatous disease. Trace bilateral effusions. 08/30/20 CT abdomen/pelvis >> Extensive pancolitis with no associated bowel perforation or obstruction. No pneumatosis with however limited evaluation for bowel ischemia on this noncontrast study. Total of 4 complex ventral wall hernias with the 3 most inferior ones containing small and Marlett bowel. Associated mesenteric fat stranding. Associated ischemia not excluded. Hepatomegaly. Sequela of prior granulomatous disease. Aortic Atherosclerosis CXR 08/30/20: No active disease in a patient status post granulomatous disease involving  the left lung  Resolved Hospital Problem list     Assessment & Plan:   Sepsis with septic shock due to suspected C-Difficile colitis & associated hypovolemia Paroxsymal Atrial Fibrillation Thoracic Aortic Aneurysm- last monitored 10/21 (4.4cm) PMHx: Tachy-Brady Arrhythmia, PPM Lactic: 2.8, Baseline PCT: pending, UA: pending, CXR: no active disease, s/p granulomatous disease involving left lung, CT abdomen/pelvis : Extensive pan colitis with no associated perforation/obstruction. Initial interventions/workup included: 3.5 L of LR & Cefepime/ Vancomycin/ Metronidazole -Continuous cardiac monitoring -Maintain MAP >65 -IV fluids -Vasopressors if needed to maintain MAP goal -Trend lactic acid -ABX as above -Continue home regimen: Eliquis, flecainide -Maintain Mg > 2/ K+ >4 -Carvedilol on hold due to vasopressor need, restart once off of pressor support  C-Difficile Colitis -Monitor fever curve -Trend WBC's & Procalcitonin -Follow cultures as above -Continue Flagyl and PO Vancomycin -WBC increased today, abdomen tender to palpation and slightly distended ~ will repeat CT Abdomen & Pelvis ~ CT with "Diffuse colonic wall thickening again noted, similar to prior study compatible with pancolitis. Diffuse sigmoid diverticulosis. Stomach and small bowel decompressed, unremarkable." No free air or obstruction, hernias similar to prior. -Repeat lactic 1.8  Acute Kidney Injury on CKD Stage 2 in the setting of hypovolemic & septic shock>>slowly improving Metabolic Acidosis Mild Hyponatremia, suspect Hypotonic Hypovolemic Baseline Cr: 0.9, Cr on admission:2.15 -Monitor I&O's / urinary output -Follow BMP -Ensure adequate renal perfusion -Avoid nephrotoxic agents as able -Replace electrolytes as indicated -IV fluids (NS @ 150 ml/hr)  Hypothyroidism - continue home synthroid  Best practice (right click and "Reselect all SmartList Selections" daily)  Diet:  Oral Pain/Anxiety/Delirium  protocol (if indicated): No VAP protocol (if indicated): Not indicated DVT prophylaxis: Systemic AC GI prophylaxis: PPI Glucose control:  SSI No Central venous access:  N/A Arterial line:  N/A Foley:  N/A Mobility:  bed rest  PT consulted: N/A Last date of multidisciplinary goals of care discussion 09/01/20 Code Status:  full code Disposition:  ICU  Updated pt at bedside 09/01/20.  All questions answered.  Labs   CBC: Recent Labs  Lab 08/30/20 2212 08/31/20 0405 09/01/20 0152  WBC 29.1* 34.2* 40.3*  NEUTROABS 25.1*  --   --   HGB 10.8* 10.7* 10.4*  HCT 32.3* 31.7* 31.6*  MCV 100.6* 100.3* 100.6*  PLT 263 266 948    Basic Metabolic Panel: Recent Labs  Lab 08/30/20 2212 08/31/20 0405 09/01/20 0152  NA 130* 131* 131*  K 4.1 4.3 4.3  CL 101 103 103  CO2 19* 21* 20*  GLUCOSE 125* 131* 113*  BUN 42* 43* 44*  CREATININE 2.15* 1.97* 1.54*  CALCIUM 8.2* 8.1* 7.6*  MG 1.5* 2.4 2.2  PHOS 3.8 4.5 4.0   GFR: Estimated Creatinine Clearance: 48.2 mL/min (A) (by C-G formula based on SCr of 1.54 mg/dL (H)). Recent Labs  Lab 08/30/20 2212 08/31/20 0009 08/31/20 0405 08/31/20 0759 09/01/20 0152  WBC 29.1*  --  34.2*  --  40.3*  LATICACIDVEN  --  2.8* 2.0* 1.6  --     Liver Function Tests: Recent Labs  Lab 08/30/20 2212  AST 21  ALT 12  ALKPHOS 105  BILITOT 0.8  PROT 4.9*  ALBUMIN 2.5*   No results for input(s): LIPASE, AMYLASE in the last 168 hours. No results for input(s): AMMONIA in the last 168 hours.  ABG No results found for: PHART, PCO2ART, PO2ART, HCO3, TCO2, ACIDBASEDEF, O2SAT   Coagulation Profile: Recent Labs  Lab 08/30/20 2212  INR 1.3*    Cardiac Enzymes: No results for input(s): CKTOTAL, CKMB, CKMBINDEX, TROPONINI in the last 168 hours.  HbA1C: Hgb A1c MFr Bld  Date/Time Value Ref Range Status  08/31/2020 04:05 AM 4.7 (L) 4.8 - 5.6 % Final    Comment:    (NOTE) Pre diabetes:          5.7%-6.4%  Diabetes:               >6.4%  Glycemic control for   <7.0% adults with diabetes     CBG: Recent Labs  Lab 08/31/20 1545 08/31/20 1942 08/31/20 2335 09/01/20 0338 09/01/20 0744  GLUCAP 130* 109* 107* 98 90    Review of Systems: Positives in BOLD  Gen: Denies fever, chills, weight change, fatigue, night sweats HEENT: Denies blurred vision, double vision, hearing loss, tinnitus, sinus congestion, rhinorrhea, sore throat, neck stiffness, dysphagia PULM: Denies shortness of breath, cough, sputum production, hemoptysis, wheezing CV: Denies chest pain, edema, orthopnea, paroxysmal nocturnal dyspnea, palpitations GI: Denies abdominal pain, nausea, vomiting, diarrhea, hematochezia, melena, constipation, change in bowel habits GU: Denies dysuria, hematuria, polyuria, oliguria, urethral discharge Endocrine: Denies hot or cold intolerance, polyuria, polyphagia or appetite change Derm: Denies rash, dry skin, scaling or peeling skin change Heme: Denies easy bruising, bleeding, bleeding gums Neuro: Denies headache, numbness, weakness, slurred speech, loss of memory or consciousness  Past Medical History:  She,  has a past medical history of Anemia, Arrhythmia, Atrial fibrillation ([place]), Cancer ([place]), Endometriosis, GERD (gastroesophageal reflux disease), Gout, Heart disease, Hypothyroidism, Obesity, Prediabetes, Renal disorder, and Ventricular hypertrophy.   Surgical History:   Past Surgical History:  Procedure Laterality Date  . ABDOMINAL HYSTERECTOMY    . ADENOIDECTOMY    . DILATION AND CURETTAGE OF UTERUS    . TONSILLECTOMY       Social History:   reports that she has never smoked. She has never used smokeless tobacco. She reports that she does not drink alcohol and does not use drugs.  Family History:  Her family history includes Diabetes in her mother; Heart attack in her father; Hypertension in her mother.   Allergies Allergies  Allergen Reactions  . Amlodipine Other (See Comments)    dizziness   . Penicillins Itching  . Tape Rash     Home Medications  Prior to Admission medications   Medication Sig Start Date End Date Taking? Authorizing Provider  allopurinol (ZYLOPRIM) 100 MG tablet Take 100 mg by mouth 2 (two) times daily.   Yes [provider]  carvedilol (COREG) 25 MG tablet Take 37.5 mg by mouth 2 (two) times daily. 06/11/20  Yes [provider]  flecainide (TAMBOCOR) 100 MG tablet Take 100 mg by mouth 2 (two) times daily. 08/13/20  Yes [provider]  furosemide (LASIX) 40 MG tablet Take 40 mg by mouth daily. 08/12/20  Yes [provider]  hydrALAZINE (APRESOLINE) 50 MG tablet Take 50 mg by mouth 2 (two) times daily. 05/08/20  Yes [provider]  levothyroxine (SYNTHROID, LEVOTHROID) 175 MCG tablet Take 175 mcg by mouth daily before breakfast.   Yes [provider]  losartan (COZAAR) 100 MG tablet Take by mouth. 09/11/17 08/30/20 Yes [provider]  methocarbamol (ROBAXIN) 500 MG tablet Take 1 tablet by mouth 3 (three) times daily. 08/13/20  Yes [provider]  metoCLOPramide (REGLAN) 5 MG tablet Take 5 mg by mouth 2 (two) times daily. 08/27/20  Yes [provider]  omeprazole (PRILOSEC) 20 MG capsule Take 1 capsule by mouth daily. 06/17/20  Yes [provider]  oxyCODONE (OXY IR/ROXICODONE) 5 MG immediate release tablet Take 5 mg by mouth every 4 (four) hours as needed. 08/07/20  Yes [provider]  oxyCODONE-acetaminophen (PERCOCET/ROXICET) 5-325 MG tablet Take 1 tablet by mouth every 8 (eight) hours as needed. 08/17/20  Yes [provider]  Potassium Chloride ER 20 MEQ TBCR Take 1 tablet by mouth daily. 06/22/20  Yes [provider]  tiZANidine (ZANAFLEX) 2 MG tablet Take 2 mg by mouth 3 (three) times daily. 04/27/20  Yes [provider]  cephALEXin (KEFLEX) 500 MG capsule Take 500 mg by mouth every 6 (six) hours. Patient not taking: Reported on 08/30/2020 08/11/20    [provider]  colchicine 0.6 MG tablet Take 0.6 mg by mouth as needed.    [provider]  ELIQUIS 5 MG TABS tablet Take 1 tablet by mouth 2 (two) times daily. Patient not taking: No sig reported 08/08/20   [provider]        Darel Hong, AGACNP-BC [place] epic messenger for cross cover needs If after hours, please call [place]:  Patient seen and examined and relevant ancillary tests reviewed.   I agree with the assessment and plan of care as outlined by Darel Hong NP.  This patient was not seen as a shared visit. The following reflects my independent critical care time.  I  personally  reviewed database in its entirety and discussed care plan in detail. In addition, this patient was discussed on multidisciplinary rounds.   I agree with assessment and plan.  SEPTIC SHOCK RESOLVED CT ABD DOES NOT SHOW ANY SIGNIFICANT CHANGES PRESSORS ARE OFF TRANSFER TO [facility] SERVICE   Corrin Parker, M.D.  [facility]  Medical Director [place] Director [facility] Cardio-Pulmonary Department

## 2020-09-01 NOTE — Progress Notes (Signed)
PHARMACY CONSULT NOTE - FOLLOW UP  Pharmacy Consult for Electrolyte Monitoring and Replacement   Recent Labs: Potassium (mmol/L)  Date Value  09/01/2020 4.3   Magnesium (mg/dL)  Date Value  09/01/2020 2.2   Calcium (mg/dL)  Date Value  09/01/2020 7.6 (L)   Albumin (g/dL)  Date Value  08/30/2020 2.5 (L)   Phosphorus (mg/dL)  Date Value  09/01/2020 4.0   Sodium (mmol/L)  Date Value  09/01/2020 131 (L)   Corr Ca: 8.8 mg/dL  Assessment:  65 y.o. female with PMH for paroxysmal afib (on flecainide and Eliquis), hypothyroidism, HTN, CKD Stage 2, tachy-brady syndrome, permanent pacemaker (2019) here with abdominal pain, diarrhea, fevers. The patient reported having been recently discharged from [facility] one week ago for cellulitis, at which time she received clindamycin & vancomycin IV. Prior to admission she had received a prescription from her PCP for keflex. Patient reports since being home she has had progressively worsening aching/gnawing, generalized abdominal pain, intermittent cramping with the inability to eat/drink. She also reports profuse, non-bloody, but foul-smelling diarrhea. Pharmacy has been consulted for management of electrolytes.   MIVR: 0.9% NS @150ml /hr  Goal of Therapy:  K 4.0 -5.1 mmol/L Mg 2.0 -2.4 mg/dL All other electrolytes WNL  Plan:   Na 131 - stable from yesterday; on 0.9% NS @100ml /hr, will continue to monitor.  Re-check electrolytes with AM labs  Sherilyn Banker, PharmD Pharmacy Resident  09/01/2020 6:36 AM

## 2020-09-02 LAB — BASIC METABOLIC PANEL
Anion gap: 5 (ref 5–15)
BUN: 45 mg/dL — ABNORMAL HIGH (ref 8–23)
CO2: 21 mmol/L — ABNORMAL LOW (ref 22–32)
Calcium: 7.6 mg/dL — ABNORMAL LOW (ref 8.9–10.3)
Chloride: 107 mmol/L (ref 98–111)
Creatinine, Ser: 1.31 mg/dL — ABNORMAL HIGH (ref 0.44–1.00)
GFR, Estimated: 45 mL/min — ABNORMAL LOW (ref >60)
Glucose, Bld: 95 mg/dL (ref 70–99)
Potassium: 4.3 mmol/L (ref 3.5–5.1)
Sodium: 133 mmol/L — ABNORMAL LOW (ref 135–145)

## 2020-09-02 LAB — GLUCOSE, CAPILLARY
Glucose-Capillary: 82 mg/dL (ref 70–99)
Glucose-Capillary: 88 mg/dL (ref 70–99)
Glucose-Capillary: 86 mg/dL (ref 70–99)
Glucose-Capillary: 94 mg/dL (ref 70–99)
Glucose-Capillary: 117 mg/dL — ABNORMAL HIGH (ref 70–99)
Glucose-Capillary: 101 mg/dL — ABNORMAL HIGH (ref 70–99)

## 2020-09-02 LAB — URINE CULTURE: Culture: NO GROWTH

## 2020-09-02 LAB — PROCALCITONIN: Procalcitonin: 1.09 ng/mL

## 2020-09-02 LAB — CBC
HCT: 31.1 % — ABNORMAL LOW (ref 36.0–46.0)
Hemoglobin: 10.2 g/dL — ABNORMAL LOW (ref 12.0–15.0)
MCH: 33 pg (ref 26.0–34.0)
MCHC: 32.8 g/dL (ref 30.0–36.0)
MCV: 100.6 fL — ABNORMAL HIGH (ref 80.0–100.0)
Platelets: 260 10*3/uL (ref 150–400)
RBC: 3.09 MIL/uL — ABNORMAL LOW (ref 3.87–5.11)
RDW: 14.3 % (ref 11.5–15.5)
WBC: 26.9 10*3/uL — ABNORMAL HIGH (ref 4.0–10.5)
nRBC: 0 % (ref 0.0–0.2)

## 2020-09-02 LAB — MAGNESIUM: Magnesium: 2.2 mg/dL (ref 1.7–2.4)

## 2020-09-02 LAB — PHOSPHORUS: Phosphorus: 3.1 mg/dL (ref 2.5–4.6)

## 2020-09-02 MED ORDER — FAMOTIDINE 20 MG PO TABS
20.0000 mg | ORAL_TABLET | Freq: Every day | ORAL | Status: DC
  Administered 2020-09-02 – 2020-09-11 (×10): 20 mg via ORAL
  Filled 2020-09-02 (×10): qty 1

## 2020-09-02 MED ORDER — ALLOPURINOL 100 MG PO TABS
100.0000 mg | ORAL_TABLET | Freq: Two times a day (BID) | ORAL | Status: DC
  Administered 2020-09-02 – 2020-09-12 (×21): 100 mg via ORAL
  Filled 2020-09-02 (×23): qty 1

## 2020-09-02 MED ORDER — CARVEDILOL 6.25 MG PO TABS
6.2500 mg | ORAL_TABLET | Freq: Two times a day (BID) | ORAL | Status: DC
  Administered 2020-09-02 – 2020-09-03 (×3): 6.25 mg via ORAL
  Filled 2020-09-02 (×3): qty 1

## 2020-09-02 NOTE — Progress Notes (Signed)
PROGRESS NOTE    [name] [name]  [id number] DOB: [birth year] DOA: 08/30/2020 PCP: Barbaraann Boys, MD  Brief Narrative: 65 year old female with history of paroxysmal A. fib on flecainide and Eliquis, thoracic aortic aneurysm, obesity, endometriosis, hypothyroidism, history of PPM for tachybradycardia syndrome presented to the ED with abdominal pain fevers and profuse diarrhea, recently discharged from [facility] after treatment for cellulitis, she received clindamycin and IV vancomycin and was discharged home on Keflex.  Few days after discharge started having profuse foul-smelling diarrhea -In the ED she was hypotensive, CT noted pancolitis treated with 3.5 L of fluid resuscitation followed by Levophed -Clinically improving, weaned off Levophed and transferred to [facility] service today 5/11   Assessment & Plan:   Septic shock Severe C. difficile colitis Profound dehydration, diarrhea -Clinically improving, continue oral vancomycin, discontinue Flagyl -Continue IV fluids today we will decrease rate, weaned off Levophed yesterday -Diet as tolerated -Transfer to regular floor  Acute kidney injury -Secondary to septic shock and dehydration -Improving, creatinine was 2.1 on admission and baseline is 0.9 -Continue gentle IV fluids today  Hypothyroidism -Continue Synthroid  Paroxysmal atrial fibrillation -Continue flecainide and Eliquis -Restart carvedilol at lower at lower dose and titrate up  DVT prophylaxis: Eliquis Code Status: Full code Family Communication: Discussed patient in detail, no family at bedside Disposition Plan:  Status is: Inpatient  Remains inpatient appropriate because:Inpatient level of care appropriate due to severity of illness   Dispo: The patient is from: Home              Anticipated d/c is to: Home              Patient currently is not medically stable to d/c.   Difficult to place patient no   Consultants:   PCCM   Procedures:    Antimicrobials:    Subjective: -Feels better, still having diarrhea, appetite is poor but improving  Objective: Vitals:   09/02/20 0800 09/02/20 0900 09/02/20 1121 09/02/20 1543  BP: (!) 102/59 140/67 124/63 120/71  Pulse: 67 63 62 66  Resp: 20 (!) 21 20 18   Temp: (!) 97 F (36.1 C)  98 F (36.7 C) 97.6 F (36.4 C)  TempSrc: Oral   Oral  SpO2: 95% 98% 99% 99%  Weight:      Height:        Intake/Output Summary (Last 24 hours) at 09/02/2020 1614 Last data filed at 09/02/2020 0900 Gross per 24 hour  Intake 5183.58 ml  Output 650 ml  Net 4533.58 ml   Filed Weights   08/31/20 0500 09/01/20 0500 09/02/20 0500  Weight: 104.2 kg 110.8 kg 110 kg    Examination:  General exam: Obese pleasant female, sitting up in bed, alert no distress CVS: S1-S2, regular rate rhythm Lungs: Clear bilaterally Abdomen: Soft, nontender, bowel sounds present Extremities: No edema Skin: No rashes on exposed skin Psychiatry: Judgement and insight appear normal. Mood & affect appropriate.     Data Reviewed:   CBC: Recent Labs  Lab 08/30/20 2212 08/31/20 0405 09/01/20 0152 09/02/20 0439  WBC 29.1* 34.2* 40.3* 26.9*  NEUTROABS 25.1*  --   --   --   HGB 10.8* 10.7* 10.4* 10.2*  HCT 32.3* 31.7* 31.6* 31.1*  MCV 100.6* 100.3* 100.6* 100.6*  PLT 263 266 288 [id number]   Basic Metabolic Panel: Recent Labs  Lab 08/30/20 2212 08/31/20 0405 09/01/20 0152 09/02/20 0439  NA 130* 131* 131* 133*  K 4.1 4.3 4.3 4.3  CL 101 103 103  107  CO2 19* 21* 20* 21*  GLUCOSE 125* 131* 113* 95  BUN 42* 43* 44* 45*  CREATININE 2.15* 1.97* 1.54* 1.31*  CALCIUM 8.2* 8.1* 7.6* 7.6*  MG 1.5* 2.4 2.2 2.2  PHOS 3.8 4.5 4.0 3.1   GFR: Estimated Creatinine Clearance: 56.4 mL/min (A) (by C-G formula based on SCr of 1.31 mg/dL (H)). Liver Function Tests: Recent Labs  Lab 08/30/20 2212  AST 21  ALT 12  ALKPHOS 105  BILITOT 0.8  PROT 4.9*  ALBUMIN 2.5*   No results for input(s): LIPASE, AMYLASE in the  last 168 hours. No results for input(s): AMMONIA in the last 168 hours. Coagulation Profile: Recent Labs  Lab 08/30/20 2212  INR 1.3*   Cardiac Enzymes: No results for input(s): CKTOTAL, CKMB, CKMBINDEX, TROPONINI in the last 168 hours. BNP (last 3 results) No results for input(s): PROBNP in the last 8760 hours. HbA1C: Recent Labs    08/31/20 0405  HGBA1C 4.7*   CBG: Recent Labs  Lab 09/01/20 2323 09/02/20 0417 09/02/20 0754 09/02/20 1124 09/02/20 1545  GLUCAP 82 88 86 94 117*   Lipid Profile: No results for input(s): CHOL, HDL, LDLCALC, TRIG, CHOLHDL, LDLDIRECT in the last 72 hours. Thyroid Function Tests: No results for input(s): TSH, T4TOTAL, FREET4, T3FREE, THYROIDAB in the last 72 hours. Anemia Panel: No results for input(s): VITAMINB12, FOLATE, FERRITIN, TIBC, IRON, RETICCTPCT in the last 72 hours. Urine analysis:    Component Value Date/Time   COLORURINE YELLOW (A) 08/31/2020 1628   APPEARANCEUR CLEAR (A) 08/31/2020 1628   LABSPEC 1.015 08/31/2020 1628   PHURINE 5.0 08/31/2020 1628   GLUCOSEU NEGATIVE 08/31/2020 1628   HGBUR NEGATIVE 08/31/2020 1628   BILIRUBINUR NEGATIVE 08/31/2020 1628   KETONESUR NEGATIVE 08/31/2020 1628   PROTEINUR NEGATIVE 08/31/2020 1628   NITRITE NEGATIVE 08/31/2020 1628   LEUKOCYTESUR NEGATIVE 08/31/2020 1628   Sepsis Labs: @LABRCNTIP (procalcitonin:4,lacticidven:4)  ) Recent Results (from the past 240 hour(s))  Resp Panel by RT-PCR (Flu A&B, Covid) Nasopharyngeal Swab     Status: None   Collection Time: 08/30/20 10:12 PM   Specimen: Nasopharyngeal Swab; Nasopharyngeal(NP) swabs in vial transport medium  Result Value Ref Range Status   SARS Coronavirus 2 by RT PCR NEGATIVE NEGATIVE Final    Comment: (NOTE) SARS-CoV-2 target nucleic acids are NOT DETECTED.  The SARS-CoV-2 RNA is generally detectable in upper respiratory specimens during the acute phase of infection. The lowest concentration of SARS-CoV-2 viral copies this  assay can detect is 138 copies/mL. A negative result does not preclude SARS-Cov-2 infection and should not be used as the sole basis for treatment or other patient management decisions. A negative result may occur with  improper specimen collection/handling, submission of specimen other than nasopharyngeal swab, presence of viral mutation(s) within the areas targeted by this assay, and inadequate number of viral copies(<138 copies/mL). A negative result must be combined with clinical observations, patient history, and epidemiological information. The expected result is Negative.  Fact Sheet for Patients:  EntrepreneurPulse.com.au  Fact Sheet for Healthcare Providers:  IncredibleEmployment.be  This test is no t yet approved or cleared by the [place] FDA and  has been authorized for detection and/or diagnosis of SARS-CoV-2 by FDA under an Emergency Use Authorization (EUA). This EUA will remain  in effect (meaning this test can be used) for the duration of the COVID-19 declaration under Section 564(b)(1) of the Act, 21 U.S.C.section 360bbb-3(b)(1), unless the authorization is terminated  or revoked sooner.       Influenza  A by PCR NEGATIVE NEGATIVE Final   Influenza B by PCR NEGATIVE NEGATIVE Final    Comment: (NOTE) The Xpert Xpress SARS-CoV-2/FLU/RSV plus assay is intended as an aid in the diagnosis of influenza from Nasopharyngeal swab specimens and should not be used as a sole basis for treatment. Nasal washings and aspirates are unacceptable for Xpert Xpress SARS-CoV-2/FLU/RSV testing.  Fact Sheet for Patients: EntrepreneurPulse.com.au  Fact Sheet for Healthcare Providers: IncredibleEmployment.be  This test is not yet approved or cleared by the [place] FDA and has been authorized for detection and/or diagnosis of SARS-CoV-2 by FDA under an Emergency Use Authorization (EUA). This EUA will  remain in effect (meaning this test can be used) for the duration of the COVID-19 declaration under Section 564(b)(1) of the Act, 21 U.S.C. section 360bbb-3(b)(1), unless the authorization is terminated or revoked.  Performed at [facility], [address] [place], [place] [address]   Blood Culture (routine x 2)     Status: None (Preliminary result)   Collection Time: 08/30/20 10:12 PM   Specimen: BLOOD  Result Value Ref Range Status   Specimen Description BLOOD BLOOD LEFT HAND  Final   Special Requests   Final    BOTTLES DRAWN AEROBIC AND ANAEROBIC Blood Culture adequate volume   Culture   Final    NO GROWTH 3 DAYS Performed at [facility], [address], [place], [place] [address]    Report Status PENDING  Incomplete  Blood Culture (routine x 2)     Status: None (Preliminary result)   Collection Time: 08/30/20 10:14 PM   Specimen: BLOOD  Result Value Ref Range Status   Specimen Description BLOOD BLOOD LEFT FOREARM  Final   Special Requests   Final    BOTTLES DRAWN AEROBIC AND ANAEROBIC Blood Culture adequate volume   Culture   Final    NO GROWTH 3 DAYS Performed at [facility], [place], [place] [address]    Report Status PENDING  Incomplete  C Difficile Quick Screen w PCR reflex     Status: Abnormal   Collection Time: 08/31/20  1:10 AM   Specimen: STOOL  Result Value Ref Range Status   C Diff antigen POSITIVE (A) NEGATIVE Final    Comment: CRITICAL RESULT CALLED TO, READ BACK BY AND VERIFIED WITH: BETH JONES AT 0235 08/30/20 MF    C Diff toxin POSITIVE (A) NEGATIVE Final    Comment: CRITICAL RESULT CALLED TO, READ BACK BY AND VERIFIED WITH: BETH JONES AT [id number] 08/30/20 MF    C Diff interpretation Toxin producing C. difficile detected.  Final    Comment: Performed at [facility], [address] [place], [place] [address]  Gastrointestinal Panel by PCR , Stool     Status: None   Collection Time: 08/31/20  1:10  AM   Specimen: Stool  Result Value Ref Range Status   Campylobacter species NOT DETECTED NOT DETECTED Final   Plesimonas shigelloides NOT DETECTED NOT DETECTED Final   Salmonella species NOT DETECTED NOT DETECTED Final   Yersinia enterocolitica NOT DETECTED NOT DETECTED Final   Vibrio species NOT DETECTED NOT DETECTED Final   Vibrio cholerae NOT DETECTED NOT DETECTED Final   Enteroaggregative E coli (EAEC) NOT DETECTED NOT DETECTED Final   Enteropathogenic E coli (EPEC) NOT DETECTED NOT DETECTED Final   Enterotoxigenic E coli (ETEC) NOT DETECTED NOT DETECTED Final   Shiga like toxin producing E coli (STEC) NOT DETECTED NOT DETECTED Final   Shigella/Enteroinvasive  E coli (EIEC) NOT DETECTED NOT DETECTED Final   Cryptosporidium NOT DETECTED NOT DETECTED Final   Cyclospora cayetanensis NOT DETECTED NOT DETECTED Final   Entamoeba histolytica NOT DETECTED NOT DETECTED Final   Giardia lamblia NOT DETECTED NOT DETECTED Final   Adenovirus F40/41 NOT DETECTED NOT DETECTED Final   Astrovirus NOT DETECTED NOT DETECTED Final   Norovirus GI/GII NOT DETECTED NOT DETECTED Final   Rotavirus A NOT DETECTED NOT DETECTED Final   Sapovirus (I, II, IV, and V) NOT DETECTED NOT DETECTED Final    Comment: Performed at [facility], [address] [place], [place] [address]  MRSA PCR Screening     Status: None   Collection Time: 08/31/20  1:20 AM  Result Value Ref Range Status   MRSA by PCR NEGATIVE NEGATIVE Final    Comment:        The GeneXpert MRSA Assay (FDA approved for NASAL specimens only), is one component of a comprehensive MRSA colonization surveillance program. It is not intended to diagnose MRSA infection nor to guide or monitor treatment for MRSA infections. Performed at [facility], [address] [place], [place] [address]   Urine culture     Status: None   Collection Time: 08/31/20  4:28 PM   Specimen: In/Out Cath Urine  Result Value Ref Range Status    Specimen Description   Final    IN/OUT CATH URINE Performed at [facility], [address] [place], [place] [address]    Special Requests   Final    NONE Performed at [facility], [address], [place], [place] [address]    Culture   Final    NO GROWTH Performed at [facility], [address] [place], [place] [address]    Report Status 09/02/2020 FINAL  Final         Radiology Studies: CT ABDOMEN PELVIS WO CONTRAST  Result Date: 09/01/2020 CLINICAL DATA:  Abdominal distension, pain EXAM: CT ABDOMEN AND PELVIS WITHOUT CONTRAST TECHNIQUE: Multidetector CT imaging of the abdomen and pelvis was performed following the standard protocol without IV contrast. COMPARISON:  08/30/2020 FINDINGS: Lower chest: Pacer wires noted in the right heart. Trace bilateral effusions and dependent/bibasilar atelectasis. Calcified granuloma in the left lung base. Hepatobiliary: No focal hepatic abnormality. Gallbladder unremarkable. Pancreas: No focal abnormality or ductal dilatation. Spleen: Calcifications throughout.  Normal size. Adrenals/Urinary Tract: No adrenal abnormality. No focal renal abnormality. No stones or hydronephrosis. Urinary bladder is unremarkable. Stomach/Bowel: Diffuse colonic wall thickening again noted, similar to prior study compatible with pancolitis. Diffuse sigmoid diverticulosis. Stomach and small bowel decompressed, unremarkable. Vascular/Lymphatic: Scattered calcifications. No evidence of aneurysm or adenopathy. Reproductive: Prior hysterectomy.  No adnexal masses. Other: No free fluid or free air. Musculoskeletal: Supraumbilical ventral hernia noted containing fat, stable. Just inferior to this hernia are 2 adjacent Allard ventral hernias containing portions of the transverse colon. Inferior to this is a Taff paraumbilical hernia containing multiple small bowel loops. No evidence of bowel obstruction. Appearance of the hernias and containing bowel are  unchanged since prior study. IMPRESSION: Multiple ventral hernias as described above, containing portions of the transverse colon and multiple small bowel loops. No evidence of bowel obstruction. Continued wall thickening throughout the colon compatible with pancolitis. Sigmoid diverticulosis. Old granulomatous disease. Trace bilateral effusions. Electronically Signed   By: Rolm Baptise M.D.   On: 09/01/2020 11:45        Scheduled Meds: . allopurinol  100 mg Oral BID  .  apixaban  5 mg Oral BID  . Chlorhexidine Gluconate Cloth  6 each Topical Daily  . famotidine  20 mg Oral QHS  . flecainide  100 mg Oral BID  . levothyroxine  175 mcg Oral Q0600  . vancomycin  500 mg Oral Q6H   Continuous Infusions: . sodium chloride 999 mL/hr at 08/31/20 1021  . sodium chloride 75 mL/hr at 09/02/20 1322  . promethazine (PHENERGAN) injection (IM or IVPB)       LOS: 2 days    Time spent: 36min  Domenic Polite, MD [facility]  09/02/2020, 4:14 PM

## 2020-09-02 NOTE — TOC Progression Note (Signed)
Transition of Care [facility]) - Progression Note    Patient Details  Name: [name] [name] MRN: [id number] Date of Birth: 10-01-1955  Transition [facility]) CM/SW [place], RN Phone Number: 09/02/2020, 4:00 PM  Clinical Narrative: Patient lives at home with 64 year old mother, aunt is currently in town taking care of mother but has to leave the end of the week.  Patient believes that other family members will be able to care for mother in the interim.  Patient also has caregivers in the neighborhood that might be able to assist mother.    Patient is tearful, stating that she was told she is going home on Friday and she does not feel able to return home and take care of her mother at the same time.  Patient is also tearful about insurance coverage and out of pocket from previous admissions.  Discussed options for caregiving and self care and taking one issue at a time.   Patient requested that we end the call and I check back in with her tomorrow.  She declined Chaplain visits at this time.        Expected Discharge Plan and Services                                                 Social Determinants of Health (SDOH) Interventions    Readmission Risk Interventions No flowsheet data found.

## 2020-09-02 NOTE — Progress Notes (Addendum)
PHARMACY CONSULT NOTE - FOLLOW UP  Pharmacy Consult for Electrolyte Monitoring and Replacement   Recent Labs: Potassium (mmol/L)  Date Value  09/02/2020 4.3   Magnesium (mg/dL)  Date Value  09/02/2020 2.2   Calcium (mg/dL)  Date Value  09/02/2020 7.6 (L)   Albumin (g/dL)  Date Value  08/30/2020 2.5 (L)   Phosphorus (mg/dL)  Date Value  09/02/2020 3.1   Sodium (mmol/L)  Date Value  09/02/2020 133 (L)   Corr Ca: 8.8 mg/dL  Assessment:  65 y.o. female with PMH for paroxysmal afib (on flecainide and Eliquis), hypothyroidism, HTN, CKD Stage 2, tachy-brady syndrome, permanent pacemaker (2019) here with abdominal pain, diarrhea, fevers. The patient reported having been recently discharged from [facility] one week ago for cellulitis, at which time she received clindamycin & vancomycin IV. Prior to admission she had received a prescription from her PCP for keflex. Patient reports since being home she has had progressively worsening aching/gnawing, generalized abdominal pain, intermittent cramping with the inability to eat/drink. She also reports profuse, non-bloody, but foul-smelling diarrhea. Pharmacy has been consulted for management of electrolytes.   MIVR: 0.9% NS @75ml /hr  Goal of Therapy:  K 4.0 -5.1 mmol/L Mg 2.0 -2.4 mg/dL All other electrolytes WNL  Plan:   Na 133, trending up from yesterday; on 0.9% NS @75ml /hr  Electrolyte consult initiated while pt in ICU. Pt being transferred out. Pharmacy will sign off for now.   Sherilyn Banker, PharmD Pharmacy Resident  09/02/2020 6:34 AM

## 2020-09-02 NOTE — Progress Notes (Signed)
Patient transferred from icu8 at 1121 in stable condition. Patient's VSS and is placed on telemetry monitoring, she is also receiving NS at 48ml/hr. Patient AAOx4 and on RA. Will assess and implement plan of care.

## 2020-09-03 ENCOUNTER — Other Ambulatory Visit (HOSPITAL_COMMUNITY): Payer: Self-pay

## 2020-09-03 DIAGNOSIS — N179 Acute kidney failure, unspecified: Secondary | ICD-10-CM

## 2020-09-03 LAB — BASIC METABOLIC PANEL
Anion gap: 3 — ABNORMAL LOW (ref 5–15)
BUN: 42 mg/dL — ABNORMAL HIGH (ref 8–23)
CO2: 23 mmol/L (ref 22–32)
Calcium: 7.5 mg/dL — ABNORMAL LOW (ref 8.9–10.3)
Chloride: 110 mmol/L (ref 98–111)
Creatinine, Ser: 1.11 mg/dL — ABNORMAL HIGH (ref 0.44–1.00)
GFR, Estimated: 56 mL/min — ABNORMAL LOW (ref >60)
Glucose, Bld: 109 mg/dL — ABNORMAL HIGH (ref 70–99)
Potassium: 4 mmol/L (ref 3.5–5.1)
Sodium: 136 mmol/L (ref 135–145)

## 2020-09-03 LAB — CBC
HCT: 32.1 % — ABNORMAL LOW (ref 36.0–46.0)
Hemoglobin: 10.6 g/dL — ABNORMAL LOW (ref 12.0–15.0)
MCH: 33.1 pg (ref 26.0–34.0)
MCHC: 33 g/dL (ref 30.0–36.0)
MCV: 100.3 fL — ABNORMAL HIGH (ref 80.0–100.0)
Platelets: 290 10*3/uL (ref 150–400)
RBC: 3.2 MIL/uL — ABNORMAL LOW (ref 3.87–5.11)
RDW: 14.1 % (ref 11.5–15.5)
WBC: 13.3 10*3/uL — ABNORMAL HIGH (ref 4.0–10.5)
nRBC: 0 % (ref 0.0–0.2)

## 2020-09-03 LAB — GLUCOSE, CAPILLARY
Glucose-Capillary: 101 mg/dL — ABNORMAL HIGH (ref 70–99)
Glucose-Capillary: 105 mg/dL — ABNORMAL HIGH (ref 70–99)
Glucose-Capillary: 80 mg/dL (ref 70–99)

## 2020-09-03 LAB — PROCALCITONIN: Procalcitonin: 0.55 ng/mL

## 2020-09-03 MED ORDER — NYSTATIN 100000 UNIT/GM EX POWD
Freq: Two times a day (BID) | CUTANEOUS | Status: DC
  Filled 2020-09-03: qty 15

## 2020-09-03 NOTE — Progress Notes (Signed)
PROGRESS NOTE    [name] [name]  KJZ:[id number] DOB: 06/15/1955 DOA: 08/30/2020 PCP: Barbaraann Boys, MD  Brief Narrative: 65 year old female with history of paroxysmal A. fib on flecainide and Eliquis, thoracic aortic aneurysm, obesity, endometriosis, hypothyroidism, history of PPM for tachybradycardia syndrome presented to the ED with abdominal pain fevers and profuse diarrhea, recently discharged from [facility] after treatment for cellulitis, she received clindamycin and IV vancomycin and was discharged home on Keflex.  Few days after discharge started having profuse foul-smelling diarrhea -In the ED she was hypotensive, CT noted pancolitis treated with 3.5 L of fluid resuscitation followed by Levophed -Clinically improving, weaned off Levophed and transferred to [facility] service on 5/11   Assessment & Plan:   Septic shock Severe C. difficile colitis Profound dehydration, diarrhea -Clinically improving, continue oral vancomycin, discontinue Flagyl -Weaned off Levophed 5/10 -Discontinue IV fluids -Continue current diet -Ambulate, PT eval, discharge planning  Acute kidney injury -Secondary to septic shock and dehydration -Improving, creatinine was 2.1 on admission and baseline is 0.9 -Creatinine down to 1, fluids discontinued  Hypothyroidism -Continue Synthroid  Paroxysmal atrial fibrillation -Continue flecainide and Eliquis -Continue carvedilol at 6.25, lower dose compared to her baseline  DVT prophylaxis: Eliquis Code Status: Full code Family Communication: Discussed patient in detail, no family at bedside Disposition Plan:  Status is: Inpatient  Remains inpatient appropriate because:Inpatient level of care appropriate due to severity of illness   Dispo: The patient is from: Home              Anticipated d/c is to: Home              Patient currently is not medically stable to d/c.   Difficult to place patient no   Consultants:   PCCM   Procedures:    Antimicrobials:    Subjective: -Still having diarrhea, overall improving, eating better, still physically weak  Objective: Vitals:   09/03/20 0029 09/03/20 0539 09/03/20 0754 09/03/20 1223  BP: (!) 116/57 120/66 130/72 139/77  Pulse: 65 65 63 66  Resp: 16 15 17 17   Temp: 97.8 F (36.6 C) (!) 97.5 F (36.4 C) (!) 97.4 F (36.3 C) 98 F (36.7 C)  TempSrc: Oral Oral Oral Oral  SpO2: 97% 100% 98% 97%  Weight:      Height:       No intake or output data in the 24 hours ending 09/03/20 1230 Filed Weights   08/31/20 0500 09/01/20 0500 09/02/20 0500  Weight: 104.2 kg 110.8 kg 110 kg    Examination:  General exam: Obese pleasant female sitting up in bed, AAOx3, no distress, tearful and very emotional CVS: S1-S2, regular rate rhythm Lungs: Clear bilaterally Abdomen: Soft, nontender, obese, bowel sounds present Extremity: No edema  Skin: No rashes on exposed skin Psychiatry: Tearful   Data Reviewed:   CBC: Recent Labs  Lab 08/30/20 2212 08/31/20 0405 09/01/20 0152 09/02/20 0439 09/03/20 0400  WBC 29.1* 34.2* 40.3* 26.9* 13.3*  NEUTROABS 25.1*  --   --   --   --   HGB 10.8* 10.7* 10.4* 10.2* 10.6*  HCT 32.3* 31.7* 31.6* 31.1* 32.1*  MCV 100.6* 100.3* 100.6* 100.6* 100.3*  PLT 263 266 288 260 948   Basic Metabolic Panel: Recent Labs  Lab 08/30/20 2212 08/31/20 0405 09/01/20 0152 09/02/20 0439 09/03/20 0400  NA 130* 131* 131* 133* 136  K 4.1 4.3 4.3 4.3 4.0  CL 101 103 103 107 110  CO2 19* 21* 20* 21* 23  GLUCOSE  125* 131* 113* 95 109*  BUN 42* 43* 44* 45* 42*  CREATININE 2.15* 1.97* 1.54* 1.31* 1.11*  CALCIUM 8.2* 8.1* 7.6* 7.6* 7.5*  MG 1.5* 2.4 2.2 2.2  --   PHOS 3.8 4.5 4.0 3.1  --    GFR: Estimated Creatinine Clearance: 66.5 mL/min (A) (by C-G formula based on SCr of 1.11 mg/dL (H)). Liver Function Tests: Recent Labs  Lab 08/30/20 2212  AST 21  ALT 12  ALKPHOS 105  BILITOT 0.8  PROT 4.9*  ALBUMIN 2.5*   No results for input(s):  LIPASE, AMYLASE in the last 168 hours. No results for input(s): AMMONIA in the last 168 hours. Coagulation Profile: Recent Labs  Lab 08/30/20 2212  INR 1.3*   Cardiac Enzymes: No results for input(s): CKTOTAL, CKMB, CKMBINDEX, TROPONINI in the last 168 hours. BNP (last 3 results) No results for input(s): PROBNP in the last 8760 hours. HbA1C: No results for input(s): HGBA1C in the last 72 hours. CBG: Recent Labs  Lab 09/02/20 1545 09/02/20 2102 09/03/20 0035 09/03/20 0541 09/03/20 0754  GLUCAP 117* 101* 105* 101* 80   Lipid Profile: No results for input(s): CHOL, HDL, LDLCALC, TRIG, CHOLHDL, LDLDIRECT in the last 72 hours. Thyroid Function Tests: No results for input(s): TSH, T4TOTAL, FREET4, T3FREE, THYROIDAB in the last 72 hours. Anemia Panel: No results for input(s): VITAMINB12, FOLATE, FERRITIN, TIBC, IRON, RETICCTPCT in the last 72 hours. Urine analysis:    Component Value Date/Time   COLORURINE YELLOW (A) 08/31/2020 1628   APPEARANCEUR CLEAR (A) 08/31/2020 1628   LABSPEC 1.015 08/31/2020 1628   PHURINE 5.0 08/31/2020 1628   GLUCOSEU NEGATIVE 08/31/2020 1628   HGBUR NEGATIVE 08/31/2020 1628   BILIRUBINUR NEGATIVE 08/31/2020 1628   KETONESUR NEGATIVE 08/31/2020 1628   PROTEINUR NEGATIVE 08/31/2020 1628   NITRITE NEGATIVE 08/31/2020 1628   LEUKOCYTESUR NEGATIVE 08/31/2020 1628   Sepsis Labs: @LABRCNTIP (procalcitonin:4,lacticidven:4)  ) Recent Results (from the past 240 hour(s))  Resp Panel by RT-PCR (Flu A&B, Covid) Nasopharyngeal Swab     Status: None   Collection Time: 08/30/20 10:12 PM   Specimen: Nasopharyngeal Swab; Nasopharyngeal(NP) swabs in vial transport medium  Result Value Ref Range Status   SARS Coronavirus 2 by RT PCR NEGATIVE NEGATIVE Final    Comment: (NOTE) SARS-CoV-2 target nucleic acids are NOT DETECTED.  The SARS-CoV-2 RNA is generally detectable in upper respiratory specimens during the acute phase of infection. The  lowest concentration of SARS-CoV-2 viral copies this assay can detect is 138 copies/mL. A negative result does not preclude SARS-Cov-2 infection and should not be used as the sole basis for treatment or other patient management decisions. A negative result may occur with  improper specimen collection/handling, submission of specimen other than nasopharyngeal swab, presence of viral mutation(s) within the areas targeted by this assay, and inadequate number of viral copies(<138 copies/mL). A negative result must be combined with clinical observations, patient history, and epidemiological information. The expected result is Negative.  Fact Sheet for Patients:  EntrepreneurPulse.com.au  Fact Sheet for Healthcare Providers:  IncredibleEmployment.be  This test is no t yet approved or cleared by the [place] FDA and  has been authorized for detection and/or diagnosis of SARS-CoV-2 by FDA under an Emergency Use Authorization (EUA). This EUA will remain  in effect (meaning this test can be used) for the duration of the COVID-19 declaration under Section 564(b)(1) of the Act, 21 U.S.C.section 360bbb-3(b)(1), unless the authorization is terminated  or revoked sooner.       Influenza A  by PCR NEGATIVE NEGATIVE Final   Influenza B by PCR NEGATIVE NEGATIVE Final    Comment: (NOTE) The Xpert Xpress SARS-CoV-2/FLU/RSV plus assay is intended as an aid in the diagnosis of influenza from Nasopharyngeal swab specimens and should not be used as a sole basis for treatment. Nasal washings and aspirates are unacceptable for Xpert Xpress SARS-CoV-2/FLU/RSV testing.  Fact Sheet for Patients: EntrepreneurPulse.com.au  Fact Sheet for Healthcare Providers: IncredibleEmployment.be  This test is not yet approved or cleared by the [place] FDA and has been authorized for detection and/or diagnosis of SARS-CoV-2 by FDA under  an Emergency Use Authorization (EUA). This EUA will remain in effect (meaning this test can be used) for the duration of the COVID-19 declaration under Section 564(b)(1) of the Act, 21 U.S.C. section 360bbb-3(b)(1), unless the authorization is terminated or revoked.  Performed at [facility], [address] [place], [place] [address]   Blood Culture (routine x 2)     Status: None (Preliminary result)   Collection Time: 08/30/20 10:12 PM   Specimen: BLOOD  Result Value Ref Range Status   Specimen Description BLOOD BLOOD LEFT HAND  Final   Special Requests   Final    BOTTLES DRAWN AEROBIC AND ANAEROBIC Blood Culture adequate volume   Culture   Final    NO GROWTH 4 DAYS Performed at [facility], [address] [place], [place] [address]    Report Status PENDING  Incomplete  Blood Culture (routine x 2)     Status: None (Preliminary result)   Collection Time: 08/30/20 10:14 PM   Specimen: BLOOD  Result Value Ref Range Status   Specimen Description BLOOD BLOOD LEFT FOREARM  Final   Special Requests   Final    BOTTLES DRAWN AEROBIC AND ANAEROBIC Blood Culture adequate volume   Culture   Final    NO GROWTH 4 DAYS Performed at [facility], [place], [place] [address]    Report Status PENDING  Incomplete  C Difficile Quick Screen w PCR reflex     Status: Abnormal   Collection Time: 08/31/20  1:10 AM   Specimen: STOOL  Result Value Ref Range Status   C Diff antigen POSITIVE (A) NEGATIVE Final    Comment: CRITICAL RESULT CALLED TO, READ BACK BY AND VERIFIED WITH: BETH JONES AT 0235 08/30/20 MF    C Diff toxin POSITIVE (A) NEGATIVE Final    Comment: CRITICAL RESULT CALLED TO, READ BACK BY AND VERIFIED WITH: BETH JONES AT 6269 08/30/20 MF    C Diff interpretation Toxin producing C. difficile detected.  Final    Comment: Performed at [facility], [address] [place], [place] [address]  Gastrointestinal Panel by PCR , Stool      Status: None   Collection Time: 08/31/20  1:10 AM   Specimen: Stool  Result Value Ref Range Status   Campylobacter species NOT DETECTED NOT DETECTED Final   Plesimonas shigelloides NOT DETECTED NOT DETECTED Final   Salmonella species NOT DETECTED NOT DETECTED Final   Yersinia enterocolitica NOT DETECTED NOT DETECTED Final   Vibrio species NOT DETECTED NOT DETECTED Final   Vibrio cholerae NOT DETECTED NOT DETECTED Final   Enteroaggregative E coli (EAEC) NOT DETECTED NOT DETECTED Final   Enteropathogenic E coli (EPEC) NOT DETECTED NOT DETECTED Final   Enterotoxigenic E coli (ETEC) NOT DETECTED NOT DETECTED Final   Shiga like toxin producing E coli (STEC) NOT DETECTED NOT DETECTED Final   Shigella/Enteroinvasive E  coli (EIEC) NOT DETECTED NOT DETECTED Final   Cryptosporidium NOT DETECTED NOT DETECTED Final   Cyclospora cayetanensis NOT DETECTED NOT DETECTED Final   Entamoeba histolytica NOT DETECTED NOT DETECTED Final   Giardia lamblia NOT DETECTED NOT DETECTED Final   Adenovirus F40/41 NOT DETECTED NOT DETECTED Final   Astrovirus NOT DETECTED NOT DETECTED Final   Norovirus GI/GII NOT DETECTED NOT DETECTED Final   Rotavirus A NOT DETECTED NOT DETECTED Final   Sapovirus (I, II, IV, and V) NOT DETECTED NOT DETECTED Final    Comment: Performed at [facility], [address] [place], [place] [address]  MRSA PCR Screening     Status: None   Collection Time: 08/31/20  1:20 AM  Result Value Ref Range Status   MRSA by PCR NEGATIVE NEGATIVE Final    Comment:        The GeneXpert MRSA Assay (FDA approved for NASAL specimens only), is one component of a comprehensive MRSA colonization surveillance program. It is not intended to diagnose MRSA infection nor to guide or monitor treatment for MRSA infections. Performed at [facility], [address], [place], [place] [address]   Urine culture     Status: None   Collection Time: 08/31/20  4:28 PM   Specimen: In/Out  Cath Urine  Result Value Ref Range Status   Specimen Description   Final    IN/OUT CATH URINE Performed at [facility], [address] [place], [place] [address]    Special Requests   Final    NONE Performed at [facility], [address], [place], [place] [address]    Culture   Final    NO GROWTH Performed at [facility], [place] [address] [place], [place] [address]    Report Status 09/02/2020 FINAL  Final     Scheduled Meds: . allopurinol  100 mg Oral BID  . apixaban  5 mg Oral BID  . carvedilol  6.25 mg Oral BID WC  . Chlorhexidine Gluconate Cloth  6 each Topical Daily  . famotidine  20 mg Oral QHS  . flecainide  100 mg Oral BID  . levothyroxine  175 mcg Oral Q0600  . nystatin   Topical BID  . vancomycin  500 mg Oral Q6H   Continuous Infusions: . sodium chloride 999 mL/hr at 08/31/20 1021  . promethazine (PHENERGAN) injection (IM or IVPB)       LOS: 3 days    Time spent: 60min  Domenic Polite, MD [facility]  09/03/2020, 12:30 PM

## 2020-09-03 NOTE — Evaluation (Signed)
Physical Therapy Evaluation Patient Details Name: [name] [name] MRN: [id number] DOB: 08/04/1955 Today's Date: 09/03/2020   History of Present Illness  Per MD note: 65 year old female with history of paroxysmal A. fib on flecainide and Eliquis, thoracic aortic aneurysm, obesity, endometriosis, hypothyroidism, history of PPM for tachybradycardia syndrome presented to the ED with abdominal pain fevers and profuse diarrhea, recently discharged from [facility] after treatment for cellulitis, she received clindamycin and IV vancomycin and was discharged home on Keflex.  Few days after discharge started having profuse foul-smelling diarrhea.    Clinical Impression  Pt received in Semi-Fowler's position and agreeable to therapy.  Pt able to perform bed-level exercises with good technique.  Pt does require use of bed rails for assistance coming into seated position at EOB.  Pt then requested the bed to be raised due to being taller and to assist with coming upright.  Pt was required VC's for hand placement and how to utilize the RW in order to come upright safely.  Pt then able to transfer to standing with good technique and no LOB.  Pt notes her R LE is weak, however notes that Cdiff and using the restroom is the source of majority of her weakness.  Pt able to ambulate in room to door and back to bed with O2 sats >90% throughout session.  Pt then transferred back to bed and all needs met.  While patient is able to safely ambulate in room, she did experience some buckling of the R knee that was able to be corrected by the pt.  Due to not having caregiver assistance and being the sole provider for her 2 y.o. mother, it was recommended pt go to [facility] facility to get stronger at this current time.  PT agreeable and noted that she is contacting an agency to assist her mother while she gets rehab during this time.  Pt will benefit from skilled PT intervention to increase independence and safety with basic  mobility in preparation for discharge to the venue listed below.       Follow Up Recommendations SNF    Equipment Recommendations  Rolling walker with 5" wheels    Recommendations for Other Services       Precautions / Restrictions Precautions Precautions: None Restrictions Weight Bearing Restrictions: No      Mobility  Bed Mobility Overal bed mobility: Needs Assistance Bed Mobility: Supine to Sit     Supine to sit: Min assist     General bed mobility comments: Pt requires assistance from the bed rails in order to come into seated position. Patient Response: Anxious  Transfers Overall transfer level: Needs assistance Equipment used: Rolling walker (2 wheeled) Transfers: Sit to/from Stand Sit to Stand: Min guard         General transfer comment: Pt utilizes raised bed due to being taller, VC's for hand placement, and CGA to prevent walker from pushing forward.  Ambulation/Gait Ambulation/Gait assistance: Min guard Gait Distance (Feet): 20 Feet Assistive device: Rolling walker (2 wheeled) Gait Pattern/deviations: Step-to pattern;Decreased step length - left;Decreased stance time - right;Decreased stride length Gait velocity: decreased   General Gait Details: Pt has difficulty with RW and prefers standar walker because she gets worried that the walker with "get away" from her.  Stairs            Wheelchair Mobility    Modified Rankin (Stroke Patients Only)       Balance Overall balance assessment: Needs assistance Sitting-balance support: Feet supported Sitting  balance-Leahy Scale: Fair     Standing balance support: Bilateral upper extremity supported Standing balance-Leahy Scale: Fair Standing balance comment: Pt displaces weight onto her L LE when in standing with the RW.                             Pertinent Vitals/Pain Pain Assessment: No/denies pain    Home Living Family/patient expects to be discharged to:: Private  residence Living Arrangements: Parent   Type of Home: Apartment Home Access: Stairs to enter Entrance Stairs-Rails: None Entrance Stairs-Number of Steps: 1 Home Layout: One level Home Equipment: Hand held shower head;Walker - standard;Shower seat;Bedside commode      Prior Function Level of Independence: Needs assistance      ADL's / Homemaking Assistance Needed: Pt is household ambulator except when going to MD appointment for which she gets transportation and uses a w/c for.        Hand Dominance   Dominant Hand: Right    Extremity/Trunk Assessment   Upper Extremity Assessment Upper Extremity Assessment: Overall WFL for tasks assessed    Lower Extremity Assessment Lower Extremity Assessment: Generalized weakness;RLE deficits/detail RLE Deficits / Details: Pt wound on the R knee RLE: Unable to fully assess due to pain RLE Sensation: WNL       Communication   Communication: No difficulties  Cognition Arousal/Alertness: Awake/alert Behavior During Therapy: WFL for tasks assessed/performed Overall Cognitive Status: Within Functional Limits for tasks assessed                                        General Comments      Exercises Total Joint Exercises Ankle Circles/Pumps: AROM;Strengthening;Both;10 reps;Supine Quad Sets: AROM;Strengthening;Both;10 reps;Supine Gluteal Sets: AROM;Strengthening;Both;10 reps;Supine Short Arc Quad: AROM;Strengthening;Both;10 reps;Supine Heel Slides: AROM;Strengthening;Both;10 reps;Supine Hip ABduction/ADduction: AROM;Strengthening;Both;10 reps;Supine Straight Leg Raises: AROM;Strengthening;Both;10 reps;Supine Marching in Standing: AROM;Strengthening;Both;10 reps;Standing Other Exercises Other Exercises: Pt educated on roles of PT and the services provided during stay.  Pt encouraged to continue with exercises shown during treatment session in order to gain physiological benefits.   Assessment/Plan    PT  Assessment Patient needs continued PT services  PT Problem List Decreased strength;Decreased range of motion;Decreased activity tolerance;Decreased balance;Decreased mobility;Decreased knowledge of use of DME       PT Treatment Interventions DME instruction;Gait training;Stair training;Functional mobility training;Therapeutic activities;Therapeutic exercise;Neuromuscular re-education    PT Goals (Current goals can be found in the Care Plan section)  Acute Rehab PT Goals Patient Stated Goal: to get stronger PT Goal Formulation: With patient Time For Goal Achievement: 09/17/20 Potential to Achieve Goals: Fair    Frequency Min 2X/week   Barriers to discharge Decreased caregiver support Pt is sole caregiver of her 43 y.o. mother.    Co-evaluation               AM-PAC PT "6 Clicks" Mobility  Outcome Measure Help needed turning from your back to your side while in a flat bed without using bedrails?: A Lot Help needed moving from lying on your back to sitting on the side of a flat bed without using bedrails?: A Lot Help needed moving to and from a bed to a chair (including a wheelchair)?: A Little Help needed standing up from a chair using your arms (e.g., wheelchair or bedside chair)?: A Little Help needed to walk in hospital room?: A Little Help needed  climbing 3-5 steps with a railing? : A Lot 6 Click Score: 15    End of Session Equipment Utilized During Treatment: Gait belt Activity Tolerance: Patient limited by fatigue;Patient limited by pain Patient left: in bed;with call bell/phone within reach;with bed alarm set Nurse Communication: Mobility status PT Visit Diagnosis: Unsteadiness on feet (R26.81);Other abnormalities of gait and mobility (R26.89);Muscle weakness (generalized) (M62.81);History of falling (Z91.81);Difficulty in walking, not elsewhere classified (R26.2);Repeated falls (R29.6)    Time: 1117-1206 PT Time Calculation (min) (ACUTE ONLY): 49 min   Charges:    PT Evaluation $PT Eval Low Complexity: 1 Low PT Treatments $Gait Training: 8-22 mins $Therapeutic Exercise: 23-37 mins        Gwenlyn Saran, PT, DPT 09/03/20, 2:49 PM   Christie Nottingham 09/03/2020, 2:22 PM

## 2020-09-04 LAB — CULTURE, BLOOD (ROUTINE X 2)
Culture: NO GROWTH
Culture: NO GROWTH
Special Requests: ADEQUATE
Special Requests: ADEQUATE

## 2020-09-04 LAB — BASIC METABOLIC PANEL
Anion gap: 4 — ABNORMAL LOW (ref 5–15)
BUN: 33 mg/dL — ABNORMAL HIGH (ref 8–23)
CO2: 23 mmol/L (ref 22–32)
Calcium: 7.6 mg/dL — ABNORMAL LOW (ref 8.9–10.3)
Chloride: 110 mmol/L (ref 98–111)
Creatinine, Ser: 0.97 mg/dL (ref 0.44–1.00)
GFR, Estimated: >60 mL/min (ref >60)
Glucose, Bld: 95 mg/dL (ref 70–99)
Potassium: 4.5 mmol/L (ref 3.5–5.1)
Sodium: 137 mmol/L (ref 135–145)

## 2020-09-04 LAB — CBC
HCT: 33.5 % — ABNORMAL LOW (ref 36.0–46.0)
Hemoglobin: 11.1 g/dL — ABNORMAL LOW (ref 12.0–15.0)
MCH: 33.3 pg (ref 26.0–34.0)
MCHC: 33.1 g/dL (ref 30.0–36.0)
MCV: 100.6 fL — ABNORMAL HIGH (ref 80.0–100.0)
Platelets: 328 10*3/uL (ref 150–400)
RBC: 3.33 MIL/uL — ABNORMAL LOW (ref 3.87–5.11)
RDW: 14.3 % (ref 11.5–15.5)
WBC: 13.2 10*3/uL — ABNORMAL HIGH (ref 4.0–10.5)
nRBC: 0 % (ref 0.0–0.2)

## 2020-09-04 MED ORDER — CARVEDILOL 12.5 MG PO TABS
12.5000 mg | ORAL_TABLET | Freq: Two times a day (BID) | ORAL | Status: DC
  Administered 2020-09-04 – 2020-09-12 (×16): 12.5 mg via ORAL
  Filled 2020-09-04 (×2): qty 2
  Filled 2020-09-04 (×2): qty 1
  Filled 2020-09-04 (×4): qty 2
  Filled 2020-09-04: qty 1
  Filled 2020-09-04 (×3): qty 2
  Filled 2020-09-04 (×2): qty 1
  Filled 2020-09-04: qty 2
  Filled 2020-09-04: qty 1
  Filled 2020-09-04: qty 2
  Filled 2020-09-04 (×2): qty 1
  Filled 2020-09-04: qty 2
  Filled 2020-09-04: qty 1
  Filled 2020-09-04: qty 2
  Filled 2020-09-04 (×2): qty 1
  Filled 2020-09-04: qty 2
  Filled 2020-09-04 (×7): qty 1

## 2020-09-04 MED ORDER — PHENYLEPHRINE-MINERAL OIL-PET 0.25-14-74.9 % RE OINT
1.0000 "application " | TOPICAL_OINTMENT | Freq: Two times a day (BID) | RECTAL | Status: DC | PRN
  Administered 2020-09-04: 1 via RECTAL
  Filled 2020-09-04: qty 57

## 2020-09-04 NOTE — Evaluation (Signed)
Occupational Therapy Evaluation Patient Details Name: [name] [name] MRN: [id number] DOB: 18-Mar-1956 Today's Date: 09/04/2020    History of Present Illness Per MD note: 65 year old female with history of paroxysmal A. fib on flecainide and Eliquis, thoracic aortic aneurysm, obesity, endometriosis, hypothyroidism, history of PPM for tachybradycardia syndrome presented to the ED with abdominal pain fevers and profuse diarrhea, recently discharged from [facility] after treatment for cellulitis, she received clindamycin and IV vancomycin and was discharged home on Keflex.  Few days after discharge started having profuse foul-smelling diarrhea.   Clinical Impression   Patient presenting with decreased I in self care, balance, functional mobility/transfer, endurance, and safety awareness.  Patient reports living with mother whom she is a caregiver for PTA. Pt endorses being independent for self care and IADLs at baseline but since fall in April 2022 she has needed assistance with multiple hospital admissions. Pt reports feeling overwelmed with caring for herself and her mother and is tearful during session. Pt seated on BSC with bout of diarrhea and needing min A for balance while performing hygiene and clothing management tasks. Pt performed stand pivot with min HHA. Pt fatigues quickly. Patient will benefit from acute OT to increase overall independence in the areas of ADLs, functional mobility, and safety awareness in order to safely discharge to next venue of care.    Follow Up Recommendations  SNF;Supervision - Intermittent    Equipment Recommendations  Other (comment) (defer to next venue of care)       Precautions / Restrictions Precautions Precautions: Fall      Mobility Bed Mobility Overal bed mobility: Needs Assistance Bed Mobility: Sit to Supine       Sit to supine: Min assist        Transfers Overall transfer level: Needs assistance Equipment used:  None Transfers: Sit to/from [facility] Sit to Stand: Min assist Stand pivot transfers: Min assist       General transfer comment: cuing for technique and hand placement    Balance Overall balance assessment: Needs assistance Sitting-balance support: Feet supported Sitting balance-Leahy Scale: Fair     Standing balance support: Bilateral upper extremity supported Standing balance-Leahy Scale: Fair                             ADL either performed or assessed with clinical judgement   ADL Overall ADL's : Needs assistance/impaired     Grooming: Wash/dry hands;Wash/dry face;Set up;Sitting               Lower Body Dressing: Moderate assistance;Maximal assistance;Sit to/from stand   Toilet Transfer: Minimal assistance;BSC;Ambulation   Toileting- Clothing Manipulation and Hygiene: Minimal assistance;Sit to/from stand       Functional mobility during ADLs: Minimal assistance       Vision Baseline Vision/History: Wears glasses Patient Visual Report: No change from baseline              Pertinent Vitals/Pain Pain Assessment: No/denies pain     Hand Dominance Right   Extremity/Trunk Assessment Upper Extremity Assessment Upper Extremity Assessment: Generalized weakness   Lower Extremity Assessment Lower Extremity Assessment: Generalized weakness RLE Deficits / Details: Pt wound on the R knee RLE: Unable to fully assess due to pain RLE Sensation: WNL   Cervical / Trunk Assessment Cervical / Trunk Assessment: Normal   Communication Communication Communication: No difficulties   Cognition Arousal/Alertness: Awake/alert Behavior During Therapy: WFL for tasks assessed/performed Overall Cognitive Status: Within Functional  Limits for tasks assessed                                                Home Living Family/patient expects to be discharged to:: Private residence Living Arrangements: Parent   Type of  Home: Apartment Home Access: Stairs to enter Technical brewer of Steps: 1 Entrance Stairs-Rails: None Home Layout: One level     Bathroom Shower/Tub: Walk-in shower;Tub/shower unit   Bathroom Toilet: Standard Bathroom Accessibility: Yes   Home Equipment: Hand held shower head;Walker - standard;Shower seat;Bedside commode          Prior Functioning/Environment Level of Independence: Needs assistance    ADL's / Homemaking Assistance Needed: Pt is household ambulator except when going to MD appointment for which she gets transportation and uses a w/c for. She has been a caregiver for her mother until initial fall in April 2022.            OT Problem List: Decreased strength;Impaired balance (sitting and/or standing);Decreased safety awareness;Decreased activity tolerance;Decreased knowledge of use of DME or AE      OT Treatment/Interventions: Self-care/ADL training;Manual therapy;Cognitive remediation/compensation;Therapeutic exercise;Patient/family education;Balance training;Energy conservation;Therapeutic activities;DME and/or AE instruction    OT Goals(Current goals can be found in the care plan section) Acute Rehab OT Goals Patient Stated Goal: to get stronger OT Goal Formulation: With patient Time For Goal Achievement: 09/18/20 Potential to Achieve Goals: Good ADL Goals Pt Will Perform Grooming: with supervision;standing Pt Will Perform Lower Body Dressing: with supervision;sit to/from stand Pt Will Transfer to Toilet: with supervision;ambulating Pt Will Perform Toileting - Clothing Manipulation and hygiene: with supervision;sit to/from stand  OT Frequency: Min 2X/week   Barriers to D/C: Decreased caregiver support  pt is caregiver for her mother who lives with her. Pt has no assistance.          AM-PAC OT "6 Clicks" Daily Activity     Outcome Measure Help from another person eating meals?: None Help from another person taking care of personal grooming?: A  Little Help from another person toileting, which includes using toliet, bedpan, or urinal?: A Little Help from another person bathing (including washing, rinsing, drying)?: A Little Help from another person to put on and taking off regular upper body clothing?: None Help from another person to put on and taking off regular lower body clothing?: A Little 6 Click Score: 20   End of Session Equipment Utilized During Treatment: Other (comment) [facility]) Nurse Communication: Mobility status  Activity Tolerance: Patient tolerated treatment well Patient left: in bed;with call bell/phone within reach;with bed alarm set  OT Visit Diagnosis: Unsteadiness on feet (R26.81);History of falling (Z91.81);Repeated falls (R29.6);Muscle weakness (generalized) (M62.81)                Time: 6301-6010 OT Time Calculation (min): 35 min Charges:  OT General Charges $OT Visit: 1 Visit OT Evaluation $OT Eval Low Complexity: 1 Low OT Treatments $Self Care/Home Management : 23-37 mins  Darleen Crocker, MS, OTR/L , CBIS ascom [phone]  09/04/20, 3:52 PM

## 2020-09-04 NOTE — NC FL2 (Signed)
[place] LEVEL OF CARE SCREENING TOOL     IDENTIFICATION  Patient Name: [name] [name] Birthdate: 08/18/1955 Sex: female Admission Date (Current Location): 08/30/2020  [place] and [place] Number:  Engineering geologist and Address:  [facility], [address], [place], [place] [address]      Provider Number: [id number]  Attending Physician Name and Address:  Domenic Polite, MD  Relative Name and Phone Number:  Tram, Wrenn (Mother)   [phone] [facility] Phone)    Current Level of Care: SNF Recommended Level of Care: [place] Prior Approval Number:    Date Approved/Denied:   PASRR Number: [id number]  Discharge Plan: SNF    Current Diagnoses: Patient Active Problem List   Diagnosis Date Noted  . Septic shock ([place]) 08/31/2020    Orientation RESPIRATION BLADDER Height & Weight     Self,Time,Situation  Normal Continent,External catheter (incontinent x1) Weight: 121.7 kg Height:  5\' 8"  (172.7 cm)  BEHAVIORAL SYMPTOMS/MOOD NEUROLOGICAL BOWEL NUTRITION STATUS      Continent (incontinent x1) Diet (Regular)  AMBULATORY STATUS COMMUNICATION OF NEEDS Skin   Limited Assist Verbally Bruising (Ecchymosis to head and right kneww)                       Personal Care Assistance Level of Assistance  Bathing,Feeding,Dressing Bathing Assistance: Limited assistance Feeding assistance: Limited assistance Dressing Assistance: Limited assistance     Functional Limitations Info  Sight,Hearing,Speech Sight Info: Adequate Hearing Info: Adequate Speech Info: Adequate    SPECIAL CARE FACTORS FREQUENCY  PT (By licensed PT),OT (By licensed OT)     PT Frequency: Min 5x weekly OT Frequency: Min 5x weekly            Contractures Contractures Info: Not present    Additional Factors Info  Code Status,Allergies Code Status Info: FULL CODE Allergies Info: Amlodipine, Penicillins,  Tape           Current Medications  (09/04/2020):  This is the current hospital active medication list Current Facility-Administered Medications  Medication Dose Route Frequency Provider Last Rate Last Admin  . 0.9 %  sodium chloride infusion  250 mL Intravenous Continuous Duffy Bruce, MD 999 mL/hr at 08/31/20 1021 Rate Change at 08/31/20 1021  . acetaminophen (TYLENOL) tablet 650 mg  650 mg Oral Q4H PRN Rust-Chester, Britton L, NP   650 mg at 09/03/20 1400  . allopurinol (ZYLOPRIM) tablet 100 mg  100 mg Oral BID Domenic Polite, MD   100 mg at 09/04/20 1051  . apixaban (ELIQUIS) tablet 5 mg  5 mg Oral BID Rust-Chester, Britton L, NP   5 mg at 09/04/20 1051  . carvedilol (COREG) tablet 12.5 mg  12.5 mg Oral BID WC Domenic Polite, MD      . Chlorhexidine Gluconate Cloth 2 % PADS 6 each  6 each Topical Daily Flora Lipps, MD   6 each at 09/03/20 0547  . famotidine (PEPCID) tablet 20 mg  20 mg Oral QHS Domenic Polite, MD   20 mg at 09/03/20 2113  . flecainide (TAMBOCOR) tablet 100 mg  100 mg Oral BID Rust-Chester, Britton L, NP   100 mg at 09/04/20 1051  . levothyroxine (SYNTHROID) tablet 175 mcg  175 mcg Oral Q0600 Rust-Chester, Huel Cote, NP   175 mcg at 09/04/20 0602  . nystatin (MYCOSTATIN/NYSTOP) topical powder   Topical BID Domenic Polite, MD   Given at 09/04/20 1052  . ondansetron (ZOFRAN) injection 4-8 mg  4-8 mg  Intravenous Q8H PRN Flora Lipps, MD   8 mg at 09/03/20 1657  . oxyCODONE-acetaminophen (PERCOCET/ROXICET) 5-325 MG per tablet 1 tablet  1 tablet Oral Q6H PRN Rust-Chester, Huel Cote, NP      . phenylephrine-shark liver oil-mineral oil-petrolatum (PREPARATION H) rectal ointment 1 application  1 application Rectal BID PRN Domenic Polite, MD      . promethazine (PHENERGAN) 12.5 mg in sodium chloride 0.9 % 50 mL IVPB  12.5 mg Intravenous Q6H PRN Darel Hong D, NP      . vancomycin (VANCOCIN) 50 mg/mL oral solution 500 mg  500 mg Oral Q6H Duffy Bruce, MD   500 mg at 09/04/20 1346     Discharge  Medications: Please see discharge summary for a list of discharge medications.  Relevant Imaging Results:  Relevant Lab Results:   Additional Information SSN [place]  Pete Pelt, RN

## 2020-09-04 NOTE — TOC Progression Note (Signed)
Transition of Care [facility]) - Progression Note    Patient Details  Name: JLYNN LY MRN: [id number] Date of Birth: [birth year]  Transition of Care [facility]) CM/SW [place], RN Phone Number: 09/04/2020, 3:01 PM  Clinical Narrative:   [facility] contacted patient re: SNF recommendation.  Patient consented to SNF workup, stated she was tired and to only update as necessary.  SNF workup sent, awaiting bed offers.  TOC contact information given, TOC follow through discharge.        Expected Discharge Plan and Services                                                 Social Determinants of Health (SDOH) Interventions    Readmission Risk Interventions No flowsheet data found.

## 2020-09-04 NOTE — Progress Notes (Signed)
PROGRESS NOTE    [name] [name]  ZOX:[id number] DOB: 06-Sep-1955 DOA: 08/30/2020 PCP: Orene DesanctisBehling, Karen, MD  Brief Narrative: 65 year old female with history of paroxysmal A. fib on flecainide and Eliquis, thoracic aortic aneurysm, obesity, endometriosis, hypothyroidism, history of PPM for tachybradycardia syndrome presented to the ED with abdominal pain fevers and profuse diarrhea, [facility] for cellul[facility], she received clindamycin and IV vancomycin and was discharged home on Keflex.  Few days after discharge started having profuse foul-smelling diarrhea -In the ED she was hypotensive, CT noted pancolitis treated with 3.5 L of fluid resuscitation followed by Levophed -Clinically improving, weaned off Levophed [facility]   Assessment & Plan:   Septic shock Severe C. difficile colitis Profound dehydration, diarrhea -Clinically improving slowly, continue oral vancomycin to complete 14-day therapy -Weaned off Levophed 5/10 and IV fluids -Continue current diet -PT eval completed, SNF recommended for rehab, TOC consult  Acute kidney injury -Secondary to septic shock and dehydration -Improving, creatinine was 2.1 on admission and baseline is 0.9 -Creatinine down to 1, fluids discontinued  Hypothyroidism -Continue Synthroid  Paroxysmal atrial fibrillation -Continue flecainide and Eliquis -Continue carvedilol at 6.25, lower dose compared to her baseline  Morbid obesity Debility -Needs lifestyle modification  DVT prophylaxis: Eliquis Code Status: Full code Family Communication: Discussed patient in detail, no family at bedside Disposition Plan:  Status is: Inpatient  Remains inpatient appropriate because:Inpatient level of care appropriate due to severity of illness   Dispo: The patient is from: Home              Anticipated d/c is to: SNF              Patient currently is not medically stable to  d/c.   Difficult to place patient no   Consultants:   PCCM   Procedures:   Antimicrobials:    Subjective: -Still with diarrhea, overall improving, some cough, considering rehab now  Objective: Vitals:   09/03/20 2153 09/04/20 0058 09/04/20 0538 09/04/20 0859  BP: (!) 152/79 (!) 143/70 139/64 136/87  Pulse: 64 69 60 62  Resp: 16 16 18 16   Temp: 98.3 F (36.8 C) 98.3 F (36.8 C) 98.7 F (37.1 C) 98 F (36.7 C)  TempSrc:    Oral  SpO2: 100% 97% 96% 98%  Weight:   121.7 kg   Height:        Intake/Output Summary (Last 24 hours) at 09/04/2020 1125 Last data filed at 09/03/2020 1300 Gross per 24 hour  Intake 240 ml  Output --  Net 240 ml   Filed Weights   09/01/20 0500 09/02/20 0500 09/04/20 0538  Weight: 110.8 kg 110 kg 121.7 kg    Examination:  General exam: Obese chronically ill female, laying in bed, AAOx3, emotional, no distress CVS: S1-S2, regular rate rhythm Lungs: Decreased breath sounds bases Abdomen: Soft, obese, nontender, bowel sounds present Extremities: No edema, Maina scab on her right knee Skin: Left arm with mild induration at the site of previous IV   Data Reviewed:   CBC: Recent Labs  Lab 08/30/20 2212 08/31/20 0405 09/01/20 0152 09/02/20 0439 09/03/20 0400 09/04/20 0553  WBC 29.1* 34.2* 40.3* 26.9* 13.3* 13.2*  NEUTROABS 25.1*  --   --   --   --   --   HGB 10.8* 10.7* 10.4* 10.2* 10.6* 11.1*  HCT 32.3* 31.7* 31.6* 31.1* 32.1* 33.5*  MCV 100.6* 100.3* 100.6* 100.6* 100.3* 100.6*  PLT 263 266 288  260 290 150   Basic Metabolic Panel: Recent Labs  Lab 08/30/20 2212 08/31/20 0405 09/01/20 0152 09/02/20 0439 09/03/20 0400 09/04/20 0553  NA 130* 131* 131* 133* 136 137  K 4.1 4.3 4.3 4.3 4.0 4.5  CL 101 103 103 107 110 110  CO2 19* 21* 20* 21* 23 23  GLUCOSE 125* 131* 113* 95 109* 95  BUN 42* 43* 44* 45* 42* 33*  CREATININE 2.15* 1.97* 1.54* 1.31* 1.11* 0.97  CALCIUM 8.2* 8.1* 7.6* 7.6* 7.5* 7.6*  MG 1.5* 2.4 2.2 2.2  --    --   PHOS 3.8 4.5 4.0 3.1  --   --    GFR: Estimated Creatinine Clearance: 80.5 mL/min (by C-G formula based on SCr of 0.97 mg/dL). Liver Function Tests: Recent Labs  Lab 08/30/20 2212  AST 21  ALT 12  ALKPHOS 105  BILITOT 0.8  PROT 4.9*  ALBUMIN 2.5*   No results for input(s): LIPASE, AMYLASE in the last 168 hours. No results for input(s): AMMONIA in the last 168 hours. Coagulation Profile: Recent Labs  Lab 08/30/20 2212  INR 1.3*   Cardiac Enzymes: No results for input(s): CKTOTAL, CKMB, CKMBINDEX, TROPONINI in the last 168 hours. BNP (last 3 results) No results for input(s): PROBNP in the last 8760 hours. HbA1C: No results for input(s): HGBA1C in the last 72 hours. CBG: Recent Labs  Lab 09/02/20 1545 09/02/20 2102 09/03/20 0035 09/03/20 0541 09/03/20 0754  GLUCAP 117* 101* 105* 101* 80   Lipid Profile: No results for input(s): CHOL, HDL, LDLCALC, TRIG, CHOLHDL, LDLDIRECT in the last 72 hours. Thyroid Function Tests: No results for input(s): TSH, T4TOTAL, FREET4, T3FREE, THYROIDAB in the last 72 hours. Anemia Panel: No results for input(s): VITAMINB12, FOLATE, FERRITIN, TIBC, IRON, RETICCTPCT in the last 72 hours. Urine analysis:    Component Value Date/Time   COLORURINE YELLOW (A) 08/31/2020 1628   APPEARANCEUR CLEAR (A) 08/31/2020 1628   LABSPEC 1.015 08/31/2020 1628   PHURINE 5.0 08/31/2020 1628   GLUCOSEU NEGATIVE 08/31/2020 1628   HGBUR NEGATIVE 08/31/2020 1628   BILIRUBINUR NEGATIVE 08/31/2020 1628   KETONESUR NEGATIVE 08/31/2020 1628   PROTEINUR NEGATIVE 08/31/2020 1628   NITRITE NEGATIVE 08/31/2020 1628   LEUKOCYTESUR NEGATIVE 08/31/2020 1628   Sepsis Labs: @LABRCNTIP (procalcitonin:4,lacticidven:4)  ) Recent Results (from the past 240 hour(s))  Resp Panel by RT-PCR (Flu A&B, Covid) Nasopharyngeal Swab     Status: None   Collection Time: 08/30/20 10:12 PM   Specimen: Nasopharyngeal Swab; Nasopharyngeal(NP) swabs in vial transport medium   Result Value Ref Range Status   SARS Coronavirus 2 by RT PCR NEGATIVE NEGATIVE Final    Comment: (NOTE) SARS-CoV-2 target nucleic acids are NOT DETECTED.  The SARS-CoV-2 RNA is generally detectable in upper respiratory specimens during the acute phase of infection. The lowest concentration of SARS-CoV-2 viral copies this assay can detect is 138 copies/mL. A negative result does not preclude SARS-Cov-2 infection and should not be used as the sole basis for treatment or other patient management decisions. A negative result may occur with  improper specimen collection/handling, submission of specimen other than nasopharyngeal swab, presence of viral mutation(s) within the areas targeted by this assay, and inadequate number of viral copies(<138 copies/mL). A negative result must be combined with clinical observations, patient history, and epidemiological information. The expected result is Negative.  Fact Sheet for Patients:  EntrepreneurPulse.com.au  Fact Sheet for Healthcare Providers:  IncredibleEmployment.be  This test is no t yet approved or cleared by the [place]  FDA and  has been authorized for detection and/or diagnosis of SARS-CoV-2 by FDA under an Emergency Use Authorization (EUA). This EUA will remain  in effect (meaning this test can be used) for the duration of the COVID-19 declaration under Section 564(b)(1) of the Act, 21 U.S.C.section 360bbb-3(b)(1), unless the authorization is terminated  or revoked sooner.       Influenza A by PCR NEGATIVE NEGATIVE Final   Influenza B by PCR NEGATIVE NEGATIVE Final    Comment: (NOTE) The Xpert Xpress SARS-CoV-2/FLU/RSV plus assay is intended as an aid in the diagnosis of influenza from Nasopharyngeal swab specimens and should not be used as a sole basis for treatment. Nasal washings and aspirates are unacceptable for Xpert Xpress SARS-CoV-2/FLU/RSV testing.  Fact Sheet for  Patients: EntrepreneurPulse.com.au  Fact Sheet for Healthcare Providers: IncredibleEmployment.be  This test is not yet approved or cleared by the [place] FDA and has been authorized for detection and/or diagnosis of SARS-CoV-2 by FDA under an Emergency Use Authorization (EUA). This EUA will remain in effect (meaning this test can be used) for the duration of the COVID-19 declaration under Section 564(b)(1) of the Act, 21 U.S.C. section 360bbb-3(b)(1), unless the authorization is terminated or revoked.  Performed at [facility], [address] [place], [place] [address]   Blood Culture (routine x 2)     Status: None   Collection Time: 08/30/20 10:12 PM   Specimen: BLOOD  Result Value Ref Range Status   Specimen Description BLOOD BLOOD LEFT HAND  Final   Special Requests   Final    BOTTLES DRAWN AEROBIC AND ANAEROBIC Blood Culture adequate volume   Culture   Final    NO GROWTH 5 DAYS Performed at [facility], [address] [place], [place] [address]    Report Status 09/04/2020 FINAL  Final  Blood Culture (routine x 2)     Status: None   Collection Time: 08/30/20 10:14 PM   Specimen: BLOOD  Result Value Ref Range Status   Specimen Description BLOOD BLOOD LEFT FOREARM  Final   Special Requests   Final    BOTTLES DRAWN AEROBIC AND ANAEROBIC Blood Culture adequate volume   Culture   Final    NO GROWTH 5 DAYS Performed at [facility], [place], [place] [address]    Report Status 09/04/2020 FINAL  Final  C Difficile Quick Screen w PCR reflex     Status: Abnormal   Collection Time: 08/31/20  1:10 AM   Specimen: STOOL  Result Value Ref Range Status   C Diff antigen POSITIVE (A) NEGATIVE Final    Comment: CRITICAL RESULT CALLED TO, READ BACK BY AND VERIFIED WITH: BETH JONES AT 0235 08/30/20 MF    C Diff toxin POSITIVE (A) NEGATIVE Final    Comment: CRITICAL RESULT CALLED TO, READ BACK BY  AND VERIFIED WITH: BETH JONES AT 3299 08/30/20 MF    C Diff interpretation Toxin producing C. difficile detected.  Final    Comment: Performed at [facility], [address] [place], [place] [address]  Gastrointestinal Panel by PCR , Stool     Status: None   Collection Time: 08/31/20  1:10 AM   Specimen: Stool  Result Value Ref Range Status   Campylobacter species NOT DETECTED NOT DETECTED Final   Plesimonas shigelloides NOT DETECTED NOT DETECTED Final   Salmonella species NOT DETECTED NOT DETECTED Final   Yersinia enterocolitica NOT DETECTED NOT DETECTED Final   Vibrio species NOT  DETECTED NOT DETECTED Final   Vibrio cholerae NOT DETECTED NOT DETECTED Final   Enteroaggregative E coli (EAEC) NOT DETECTED NOT DETECTED Final   Enteropathogenic E coli (EPEC) NOT DETECTED NOT DETECTED Final   Enterotoxigenic E coli (ETEC) NOT DETECTED NOT DETECTED Final   Shiga like toxin producing E coli (STEC) NOT DETECTED NOT DETECTED Final   Shigella/Enteroinvasive E coli (EIEC) NOT DETECTED NOT DETECTED Final   Cryptosporidium NOT DETECTED NOT DETECTED Final   Cyclospora cayetanensis NOT DETECTED NOT DETECTED Final   Entamoeba histolytica NOT DETECTED NOT DETECTED Final   Giardia lamblia NOT DETECTED NOT DETECTED Final   Adenovirus F40/41 NOT DETECTED NOT DETECTED Final   Astrovirus NOT DETECTED NOT DETECTED Final   Norovirus GI/GII NOT DETECTED NOT DETECTED Final   Rotavirus A NOT DETECTED NOT DETECTED Final   Sapovirus (I, II, IV, and V) NOT DETECTED NOT DETECTED Final    Comment: Performed at [facility], [address] [place], [place] [address]  MRSA PCR Screening     Status: None   Collection Time: 08/31/20  1:20 AM  Result Value Ref Range Status   MRSA by PCR NEGATIVE NEGATIVE Final    Comment:        The GeneXpert MRSA Assay (FDA approved for NASAL specimens only), is one component of a comprehensive MRSA colonization surveillance program. It is not intended to  diagnose MRSA infection nor to guide or monitor treatment for MRSA infections. Performed at [facility], [address], [place], [place] [address]   Urine culture     Status: None   Collection Time: 08/31/20  4:28 PM   Specimen: In/Out Cath Urine  Result Value Ref Range Status   Specimen Description   Final    IN/OUT CATH URINE Performed at [facility], [address], [place], [place] [address]    Special Requests   Final    NONE Performed at [facility], [address], [place], [place] [address]    Culture   Final    NO GROWTH Performed at [facility], [place] [address], [place], [place] [address]    Report Status 09/02/2020 FINAL  Final     Scheduled Meds: . allopurinol  100 mg Oral BID  . apixaban  5 mg Oral BID  . carvedilol  12.5 mg Oral BID WC  . Chlorhexidine Gluconate Cloth  6 each Topical Daily  . famotidine  20 mg Oral QHS  . flecainide  100 mg Oral BID  . levothyroxine  175 mcg Oral Q0600  . nystatin   Topical BID  . vancomycin  500 mg Oral Q6H   Continuous Infusions: . sodium chloride 999 mL/hr at 08/31/20 1021  . promethazine (PHENERGAN) injection (IM or IVPB)       LOS: 4 days    Time spent: 38min  Domenic Polite, MD [facility]  09/04/2020, 11:25 AM

## 2020-09-05 NOTE — Progress Notes (Signed)
Physical Therapy Treatment Patient Details Name: [name] [name] MRN: [id number] DOB: 05/01/1955 Today's Date: 09/05/2020    History of Present Illness Per MD note: 65 year old female with history of paroxysmal A. fib on flecainide and Eliquis, thoracic aortic aneurysm, obesity, endometriosis, hypothyroidism, history of PPM for tachybradycardia syndrome presented to the ED with abdominal pain fevers and profuse diarrhea, recently discharged from [facility] after treatment for cellulitis, she received clindamycin and IV vancomycin and was discharged home on Keflex.  Few days after discharge started having profuse foul-smelling diarrhea.    PT Comments    Pt tolerated treatment well today, however, further mobility and participation limited secondary to fatigue. Pt able to improve overall assist levels, ambulation distance, pain levels, and activity tolerance since last session. Pt required grossly supervision for bed mobility, supervision - CGA for transfers, and supervision for short distance ambulation in room with RW. Increased time/effort required during session for pt toileting. Despite progress today, pt continues to be limited with meeting goals secondary to decreased activity tolerance, generalized weakness, anxiety with mobility, and decreased balance. Pt with RPE of 7-8/10 indicating "vigorous activity" after ambulating 99ft in room with RW. Pt left in recliner without alarm pad connected per pt request; RN notified. Pt will continue to benefit from skilled acute PT services to address deficits for return to baseline function. Will continue to recommend SNF at [place].  Of note, upon entry there was a fall risk pad next to pt's bed. Pt states that she understands why the pad is there, but does not like it as she does not feel comfortable getting out of bed to use the bathroom. Pt states that she has been independent with stand-pivot transfers from EOB<>BSC due to frequent BM's. PT  explained the reason for the fall risk pad and consequences of not having it; pt verbalized understanding and states she does not wish to have it there. RN notified and is agreeable.    Follow Up Recommendations  SNF     Equipment Recommendations  Rolling walker with 5" wheels    Recommendations for Other Services       Precautions / Restrictions Precautions Precautions: Fall    Mobility  Bed Mobility Overal bed mobility: Needs Assistance Bed Mobility: Sit to Supine     Supine to sit: Supervision     General bed mobility comments: Supervision for safety to sit EOB with HOB slightly elevated and use of BUE for support. Increased time/effort secondary to weakness and fatigue.    Transfers Overall transfer level: Needs assistance Equipment used: Rolling walker (2 wheeled) Transfers: Stand Pivot Transfers Sit to Stand: Min guard Stand pivot transfers: Supervision       General transfer comment: Supervision for stand pivot from EOB>BSC without AD, demonstrating good safety awareness and short shuffled steps for positioning on BSC. CGA for safety for STS from EOB x2 and recliner x1 with RW, with cues for hand placement, sequencing and safety.  Ambulation/Gait Ambulation/Gait assistance: Supervision Gait Distance (Feet): 72 Feet Assistive device: Rolling walker (2 wheeled)       General Gait Details: Supervision to ambulate in room with RW. Pt demonstrates slowed cadence, narrow BOS, early reciprocal gait, decreased step length/foot clearance bil, and decreased toe off bil. RPE of 7-8/10 after mobility indicating "vigorous activity"     Balance Overall balance assessment: Needs assistance Sitting-balance support: Feet supported Sitting balance-Leahy Scale: Good Sitting balance - Comments: Good seated balance at EOB and on BSC   Standing balance support:  Bilateral upper extremity supported Standing balance-Leahy Scale: Fair Standing balance comment: Fair standing  balance due to slowed cadence and anxiety from recent fall                            Cognition Arousal/Alertness: Awake/alert Behavior During Therapy: WFL for tasks assessed/performed Overall Cognitive Status: Within Functional Limits for tasks assessed                                 General Comments: Able to follow 100% of simple multistep commands      Exercises Other Exercises Other Exercises: Pt able to participate in bed mobility, transfers, toileting on BSC, and gait with RW. Pt was grossly supervision-CGA for safety for all mobility. Pt demonstrates good safety awareness and fair standing balance. Other Exercises: Pt educated regarding: PT role/POC, safety with mobility, fall risk, toileting, activity modification/pacing. She verbalized understanding of all education.    General Comments General comments (skin integrity, edema, etc.): Hematoma and scabbing to forehead and R knee      Pertinent Vitals/Pain Pain Assessment: No/denies pain           PT Goals (current goals can now be found in the care plan section) Acute Rehab PT Goals Patient Stated Goal: to get stronger PT Goal Formulation: With patient Time For Goal Achievement: 09/17/20 Potential to Achieve Goals: Fair Progress towards PT goals: Progressing toward goals    Frequency    Min 2X/week       AM-PAC PT "6 Clicks" Mobility   Outcome Measure  Help needed turning from your back to your side while in a flat bed without using bedrails?: A Little Help needed moving from lying on your back to sitting on the side of a flat bed without using bedrails?: A Little Help needed moving to and from a bed to a chair (including a wheelchair)?: A Little Help needed standing up from a chair using your arms (e.g., wheelchair or bedside chair)?: A Little Help needed to walk in hospital room?: A Little Help needed climbing 3-5 steps with a railing? : A Lot 6 Click Score: 17    End of  Session Equipment Utilized During Treatment: Gait belt Activity Tolerance: Patient limited by fatigue Patient left: with call bell/phone within reach;in chair (chair alarm turned off as pt is Ind with stand pivot transfers to [facility] due to multiple and frequent BM's - RN notified and is agreeable) Nurse Communication: Mobility status PT Visit Diagnosis: Unsteadiness on feet (R26.81);Other abnormalities of gait and mobility (R26.89);Muscle weakness (generalized) (M62.81);History of falling (Z91.81);Difficulty in walking, not elsewhere classified (R26.2);Repeated falls (R29.6)     Time: 7824-2353 PT Time Calculation (min) (ACUTE ONLY): 38 min  Charges:  $Therapeutic Activity: 38-52 mins                        Herminio Commons, PT, DPT 2:01 PM,09/05/20

## 2020-09-05 NOTE — TOC Progression Note (Signed)
Transition of Care [facility]) - Progression Note    Patient Details  Name: [name] [name] MRN: [id number] Date of Birth: 1956-02-26  Transition of [facility] CM/SW [place], LCSW Phone Number: 09/05/2020, 10:23 AM  Clinical Narrative:   Reached out to SNFs with pending bed requests, requesting that they review patient.         Expected Discharge Plan and Services                                                 Social Determinants of Health (SDOH) Interventions    Readmission Risk Interventions No flowsheet data found.

## 2020-09-05 NOTE — Progress Notes (Signed)
PROGRESS NOTE    [name] [name]  ZOX:[id number] DOB: 06-01-1955 DOA: 08/30/2020 PCP: Barbaraann Boys, MD  Brief Narrative: 65 year old female with history of paroxysmal A. fib on flecainide and Eliquis, thoracic aortic aneurysm, obesity, endometriosis, hypothyroidism, history of PPM for tachybradycardia syndrome presented to the ED with abdominal pain fevers and profuse diarrhea, recently discharged from [facility] after treatment for cellulitis, she received clindamycin and IV vancomycin and was discharged home on Keflex.  Few days after discharge started having profuse foul-smelling diarrhea -In the ED she was hypotensive, CT noted pancolitis treated with 3.5 L of fluid resuscitation followed by Levophed -Clinically improving, weaned off Levophed and transferred to [facility] service on 5/11   Assessment & Plan:   Septic shock Severe C. difficile colitis Profound dehydration, diarrhea -Clinically improving slowly, continue oral vancomycin to complete 14-day therapy -Weaned off Levophed 5/10 and IV fluids -Slowly improving -PT eval completed, SNF recommended -Discharge planning, SNF when bed available  Acute kidney injury -Secondary to septic shock and dehydration -Improving, creatinine was 2.1 on admission and baseline is 0.9 -Creatinine down to 1, off IV fluids  Hypothyroidism -Continue Synthroid  Paroxysmal atrial fibrillation -Continue flecainide and Eliquis -Continue carvedilol   Morbid obesity Debility -Needs lifestyle modification  DVT prophylaxis: Eliquis Code Status: Full code Family Communication: Discussed patient in detail, no family at bedside Disposition Plan:  Status is: Inpatient  Remains inpatient appropriate because:Inpatient level of care appropriate due to severity of illness   Dispo: The patient is from: Home              Anticipated d/c is to: SNF              Patient currently is medically stable to d/c.   Difficult to place patient  no   Consultants:   PCCM   Procedures:   Antimicrobials:    Subjective: -Still with diarrhea, overall improving, tired  Objective: Vitals:   09/05/20 0008 09/05/20 0508 09/05/20 0812 09/05/20 1203  BP: (!) 147/74 (!) 155/77 (!) 141/79 (!) 144/79  Pulse: 62 65 64 61  Resp: 17 16 17 17   Temp: 97.7 F (36.5 C) 97.8 F (36.6 C) 98.2 F (36.8 C) 98.1 F (36.7 C)  TempSrc: Oral Oral    SpO2: 100% 98% 98% 98%  Weight:      Height:        Intake/Output Summary (Last 24 hours) at 09/05/2020 1404 Last data filed at 09/04/2020 2000 Gross per 24 hour  Intake 100 ml  Output --  Net 100 ml   Filed Weights   09/01/20 0500 09/02/20 0500 09/04/20 0538  Weight: 110.8 kg 110 kg 121.7 kg    Examination:  General exam: Obese, chronically ill female lying in bed, AAO x3, no distress CVS: S1-S2, regular rate rhythm Lungs: Decreased breath sounds to bases Abdomen: Soft, obese, nontender, bowel sounds present Extremities: No edema, Wenrich scab over right knee  Skin: Left arm with mild induration at the site of previous IV   Data Reviewed:   CBC: Recent Labs  Lab 08/30/20 2212 08/31/20 0405 09/01/20 0152 09/02/20 0439 09/03/20 0400 09/04/20 0553  WBC 29.1* 34.2* 40.3* 26.9* 13.3* 13.2*  NEUTROABS 25.1*  --   --   --   --   --   HGB 10.8* 10.7* 10.4* 10.2* 10.6* 11.1*  HCT 32.3* 31.7* 31.6* 31.1* 32.1* 33.5*  MCV 100.6* 100.3* 100.6* 100.6* 100.3* 100.6*  PLT 263 266 288 260 290 811   Basic Metabolic Panel:  Recent Labs  Lab 08/30/20 2212 08/31/20 0405 09/01/20 0152 09/02/20 0439 09/03/20 0400 09/04/20 0553  NA 130* 131* 131* 133* 136 137  K 4.1 4.3 4.3 4.3 4.0 4.5  CL 101 103 103 107 110 110  CO2 19* 21* 20* 21* 23 23  GLUCOSE 125* 131* 113* 95 109* 95  BUN 42* 43* 44* 45* 42* 33*  CREATININE 2.15* 1.97* 1.54* 1.31* 1.11* 0.97  CALCIUM 8.2* 8.1* 7.6* 7.6* 7.5* 7.6*  MG 1.5* 2.4 2.2 2.2  --   --   PHOS 3.8 4.5 4.0 3.1  --   --    GFR: Estimated Creatinine  Clearance: 80.5 mL/min (by C-G formula based on SCr of 0.97 mg/dL). Liver Function Tests: Recent Labs  Lab 08/30/20 2212  AST 21  ALT 12  ALKPHOS 105  BILITOT 0.8  PROT 4.9*  ALBUMIN 2.5*   No results for input(s): LIPASE, AMYLASE in the last 168 hours. No results for input(s): AMMONIA in the last 168 hours. Coagulation Profile: Recent Labs  Lab 08/30/20 2212  INR 1.3*   Cardiac Enzymes: No results for input(s): CKTOTAL, CKMB, CKMBINDEX, TROPONINI in the last 168 hours. BNP (last 3 results) No results for input(s): PROBNP in the last 8760 hours. HbA1C: No results for input(s): HGBA1C in the last 72 hours. CBG: Recent Labs  Lab 09/02/20 1545 09/02/20 2102 09/03/20 0035 09/03/20 0541 09/03/20 0754  GLUCAP 117* 101* 105* 101* 80   Lipid Profile: No results for input(s): CHOL, HDL, LDLCALC, TRIG, CHOLHDL, LDLDIRECT in the last 72 hours. Thyroid Function Tests: No results for input(s): TSH, T4TOTAL, FREET4, T3FREE, THYROIDAB in the last 72 hours. Anemia Panel: No results for input(s): VITAMINB12, FOLATE, FERRITIN, TIBC, IRON, RETICCTPCT in the last 72 hours. Urine analysis:    Component Value Date/Time   COLORURINE YELLOW (A) 08/31/2020 1628   APPEARANCEUR CLEAR (A) 08/31/2020 1628   LABSPEC 1.015 08/31/2020 1628   PHURINE 5.0 08/31/2020 1628   GLUCOSEU NEGATIVE 08/31/2020 1628   HGBUR NEGATIVE 08/31/2020 1628   BILIRUBINUR NEGATIVE 08/31/2020 1628   KETONESUR NEGATIVE 08/31/2020 1628   PROTEINUR NEGATIVE 08/31/2020 1628   NITRITE NEGATIVE 08/31/2020 1628   LEUKOCYTESUR NEGATIVE 08/31/2020 1628   Sepsis Labs: @LABRCNTIP (procalcitonin:4,lacticidven:4)  ) Recent Results (from the past 240 hour(s))  Resp Panel by RT-PCR (Flu A&B, Covid) Nasopharyngeal Swab     Status: None   Collection Time: 08/30/20 10:12 PM   Specimen: Nasopharyngeal Swab; Nasopharyngeal(NP) swabs in vial transport medium  Result Value Ref Range Status   SARS Coronavirus 2 by RT PCR  NEGATIVE NEGATIVE Final    Comment: (NOTE) SARS-CoV-2 target nucleic acids are NOT DETECTED.  The SARS-CoV-2 RNA is generally detectable in upper respiratory specimens during the acute phase of infection. The lowest concentration of SARS-CoV-2 viral copies this assay can detect is 138 copies/mL. A negative result does not preclude SARS-Cov-2 infection and should not be used as the sole basis for treatment or other patient management decisions. A negative result may occur with  improper specimen collection/handling, submission of specimen other than nasopharyngeal swab, presence of viral mutation(s) within the areas targeted by this assay, and inadequate number of viral copies(<138 copies/mL). A negative result must be combined with clinical observations, patient history, and epidemiological information. The expected result is Negative.  Fact Sheet for Patients:  EntrepreneurPulse.com.au  Fact Sheet for Healthcare Providers:  IncredibleEmployment.be  This test is no t yet approved or cleared by the [place] FDA and  has been authorized for detection  and/or diagnosis of SARS-CoV-2 by FDA under an Emergency Use Authorization (EUA). This EUA will remain  in effect (meaning this test can be used) for the duration of the COVID-19 declaration under Section 564(b)(1) of the Act, 21 U.S.C.section 360bbb-3(b)(1), unless the authorization is terminated  or revoked sooner.       Influenza A by PCR NEGATIVE NEGATIVE Final   Influenza B by PCR NEGATIVE NEGATIVE Final    Comment: (NOTE) The Xpert Xpress SARS-CoV-2/FLU/RSV plus assay is intended as an aid in the diagnosis of influenza from Nasopharyngeal swab specimens and should not be used as a sole basis for treatment. Nasal washings and aspirates are unacceptable for Xpert Xpress SARS-CoV-2/FLU/RSV testing.  Fact Sheet for Patients: EntrepreneurPulse.com.au  Fact Sheet for  Healthcare Providers: IncredibleEmployment.be  This test is not yet approved or cleared by the [place] FDA and has been authorized for detection and/or diagnosis of SARS-CoV-2 by FDA under an Emergency Use Authorization (EUA). This EUA will remain in effect (meaning this test can be used) for the duration of the COVID-19 declaration under Section 564(b)(1) of the Act, 21 U.S.C. section 360bbb-3(b)(1), unless the authorization is terminated or revoked.  Performed at [facility], [address] [place], [place] [address]   Blood Culture (routine x 2)     Status: None   Collection Time: 08/30/20 10:12 PM   Specimen: BLOOD  Result Value Ref Range Status   Specimen Description BLOOD BLOOD LEFT HAND  Final   Special Requests   Final    BOTTLES DRAWN AEROBIC AND ANAEROBIC Blood Culture adequate volume   Culture   Final    NO GROWTH 5 DAYS Performed at [facility], [address], [place], [place] [address]    Report Status 09/04/2020 FINAL  Final  Blood Culture (routine x 2)     Status: None   Collection Time: 08/30/20 10:14 PM   Specimen: BLOOD  Result Value Ref Range Status   Specimen Description BLOOD BLOOD LEFT FOREARM  Final   Special Requests   Final    BOTTLES DRAWN AEROBIC AND ANAEROBIC Blood Culture adequate volume   Culture   Final    NO GROWTH 5 DAYS Performed at [facility], [place], [place] [address]    Report Status 09/04/2020 FINAL  Final  C Difficile Quick Screen w PCR reflex     Status: Abnormal   Collection Time: 08/31/20  1:10 AM   Specimen: STOOL  Result Value Ref Range Status   C Diff antigen POSITIVE (A) NEGATIVE Final    Comment: CRITICAL RESULT CALLED TO, READ BACK BY AND VERIFIED WITH: BETH JONES AT 0235 08/30/20 MF    C Diff toxin POSITIVE (A) NEGATIVE Final    Comment: CRITICAL RESULT CALLED TO, READ BACK BY AND VERIFIED WITH: BETH JONES AT [id number] 08/30/20 MF    C Diff  interpretation Toxin producing C. difficile detected.  Final    Comment: Performed at [facility], [address] [place], [place] [address]  Gastrointestinal Panel by PCR , Stool     Status: None   Collection Time: 08/31/20  1:10 AM   Specimen: Stool  Result Value Ref Range Status   Campylobacter species NOT DETECTED NOT DETECTED Final   Plesimonas shigelloides NOT DETECTED NOT DETECTED Final   Salmonella species NOT DETECTED NOT DETECTED Final   Yersinia enterocolitica NOT DETECTED NOT DETECTED Final   Vibrio species NOT DETECTED NOT DETECTED Final   Vibrio cholerae  NOT DETECTED NOT DETECTED Final   Enteroaggregative E coli (EAEC) NOT DETECTED NOT DETECTED Final   Enteropathogenic E coli (EPEC) NOT DETECTED NOT DETECTED Final   Enterotoxigenic E coli (ETEC) NOT DETECTED NOT DETECTED Final   Shiga like toxin producing E coli (STEC) NOT DETECTED NOT DETECTED Final   Shigella/Enteroinvasive E coli (EIEC) NOT DETECTED NOT DETECTED Final   Cryptosporidium NOT DETECTED NOT DETECTED Final   Cyclospora cayetanensis NOT DETECTED NOT DETECTED Final   Entamoeba histolytica NOT DETECTED NOT DETECTED Final   Giardia lamblia NOT DETECTED NOT DETECTED Final   Adenovirus F40/41 NOT DETECTED NOT DETECTED Final   Astrovirus NOT DETECTED NOT DETECTED Final   Norovirus GI/GII NOT DETECTED NOT DETECTED Final   Rotavirus A NOT DETECTED NOT DETECTED Final   Sapovirus (I, II, IV, and V) NOT DETECTED NOT DETECTED Final    Comment: Performed at [facility], [address] [place], [place] [address]  MRSA PCR Screening     Status: None   Collection Time: 08/31/20  1:20 AM  Result Value Ref Range Status   MRSA by PCR NEGATIVE NEGATIVE Final    Comment:        The GeneXpert MRSA Assay (FDA approved for NASAL specimens only), is one component of a comprehensive MRSA colonization surveillance program. It is not intended to diagnose MRSA infection nor to guide or monitor treatment  for MRSA infections. Performed at [facility], [address] [place], [place] [address]   Urine culture     Status: None   Collection Time: 08/31/20  4:28 PM   Specimen: In/Out Cath Urine  Result Value Ref Range Status   Specimen Description   Final    IN/OUT CATH URINE Performed at [facility], [address] [place], [place] [address]    Special Requests   Final    NONE Performed at [facility], [address] [place], [place] [address]    Culture   Final    NO GROWTH Performed at [facility], [address], [place], [place] [address]    Report Status 09/02/2020 FINAL  Final     Scheduled Meds: . allopurinol  100 mg Oral BID  . apixaban  5 mg Oral BID  . carvedilol  12.5 mg Oral BID WC  . Chlorhexidine Gluconate Cloth  6 each Topical Daily  . famotidine  20 mg Oral QHS  . flecainide  100 mg Oral BID  . levothyroxine  175 mcg Oral Q0600  . nystatin   Topical BID  . vancomycin  500 mg Oral Q6H   Continuous Infusions: . sodium chloride 999 mL/hr at 08/31/20 1021  . promethazine (PHENERGAN) injection (IM or IVPB)       LOS: 5 days    Time spent: 9min  Domenic Polite, MD [facility]  09/05/2020, 2:04 PM

## 2020-09-06 DIAGNOSIS — A419 Sepsis, unspecified organism: Secondary | ICD-10-CM

## 2020-09-06 LAB — CBC
HCT: 31.4 % — ABNORMAL LOW (ref 36.0–46.0)
Hemoglobin: 10.4 g/dL — ABNORMAL LOW (ref 12.0–15.0)
MCH: 33.1 pg (ref 26.0–34.0)
MCHC: 33.1 g/dL (ref 30.0–36.0)
MCV: 100 fL (ref 80.0–100.0)
Platelets: 288 10*3/uL (ref 150–400)
RBC: 3.14 MIL/uL — ABNORMAL LOW (ref 3.87–5.11)
RDW: 14.4 % (ref 11.5–15.5)
WBC: 9.4 10*3/uL (ref 4.0–10.5)
nRBC: 0 % (ref 0.0–0.2)

## 2020-09-06 NOTE — TOC Progression Note (Signed)
Transition of Care [facility]) - Progression Note    Patient Details  Name: [name] [name] MRN: [id number] Date of Birth: 1955/12/02  Transition of Care [facility]) CM/SW [place], LCSW Phone Number: 09/06/2020, 9:53 AM  Clinical Narrative:   Patient has bed offers in [facility] for [facility] and [facility]. CSW received message from [facility] that they can also make a bed offer, she just has to confirm insurance with her Business Office.  CSW called into patient's room due to isolation. Presented bed offers and medicare.gov star ratings. Patient chose [facility]. CSW asked [facility] to start insurance auth.         Expected Discharge Plan and Services                                                 Social Determinants of Health (SDOH) Interventions    Readmission Risk Interventions No flowsheet data found.

## 2020-09-06 NOTE — Progress Notes (Signed)
Patient refuses bed alarms at this time, patient educated about safety, will continue to monitor 

## 2020-09-06 NOTE — Progress Notes (Signed)
Patient seen and examined, overall improving, still with some diarrhea -Remains on oral vancomycin for C. difficile colitis -Medically stable awaiting SNF for rehabilitation  Domenic Polite, MD

## 2020-09-07 MED ORDER — HYDRALAZINE HCL 25 MG PO TABS: 25.0000 mg | ORAL_TABLET | Freq: Two times a day (BID) | ORAL | Status: DC

## 2020-09-07 MED ORDER — VANCOMYCIN 50 MG/ML ORAL SOLUTION: 125.0000 mg | Freq: Four times a day (QID) | ORAL | 0 refills | Status: DC

## 2020-09-07 MED ORDER — FAMOTIDINE 20 MG PO TABS: 20.0000 mg | ORAL_TABLET | Freq: Every day | ORAL | Status: DC

## 2020-09-07 MED ORDER — ZINC OXIDE 40 % EX OINT
TOPICAL_OINTMENT | CUTANEOUS | Status: DC | PRN
  Filled 2020-09-07: qty 113

## 2020-09-07 MED ORDER — CARVEDILOL 12.5 MG PO TABS: 12.5000 mg | ORAL_TABLET | Freq: Two times a day (BID) | ORAL | Status: DC

## 2020-09-07 NOTE — TOC Progression Note (Addendum)
Transition of Care [facility]) - Progression Note    Patient Details  Name: WILLYE JAVIER MRN: [id number] Date of Birth: [birth year]  Transition of Care [facility]) CM/SW [place], RN Phone Number: 09/07/2020, 4:36 PM  Clinical Narrative:   Workers IT sales professional contacted [employer],  RNCM spoke to [employer], states unsure if comp will pay for this stay, but referred to  case Lawyer.  Patient is aware of Ludwig Clarks and Jackelyn Poling seeking information from [employer] and gave verbal authorization due to isolation to [facility] to fax MD notes to [employer].  [facility] notified of Workers Comp, wants update tomorrow, as the facility cannot accept if worker's comp.  Patient would like to hold off on transfer due to insurance until we are sure of the payor.  Jackelyn Poling has left the office TOC will contact in the morning. TOC contact information given, TOC to follow. Addendum;  Adding Worker's Comp contact information: - Eddie Case Mgt w/Worker's Comp.Truman Hayward [phone]         Expected Discharge Plan and Services                                                 Social Determinants of Health (SDOH) Interventions    Readmission Risk Interventions No flowsheet data found.

## 2020-09-07 NOTE — TOC Progression Note (Signed)
Transition of Care [facility]) - Progression Note    Patient Details  Name: [name] [name] MRN: [id number] Date of Birth: Aug 02, 1955  Transition of Care [facility]) CM/SW [place], RN Phone Number: 09/07/2020, 12:22 PM  Clinical Narrative:   TOC spoke with patient via phone due to isolation status.  [facility] states with patient's insurance, there is a $250 cost per admission.  Patient consents to this at this time.  Worker's IT sales professional notified TOC that medical records should be sent to [facility] for evaluation.  Patient is aware of [facility] Worker's Comp.  [facility] states patient may not be eligible for SNF stay if Worker's Comp case.  At this time, Worker's comp is still evaluating for approval.  Lynelle Smoke will work up [employer] until we are notified otherwise.    TOC contact information given, TOC to follow through discharge.   Expected Discharge Plan and Services                                                 Social Determinants of Health (SDOH) Interventions    Readmission Risk Interventions No flowsheet data found.

## 2020-09-07 NOTE — Discharge Summary (Addendum)
Physician Discharge Summary  [name] [name] [id number] DOB: 12-17-1955 DOA: 08/30/2020  PCP: Barbaraann Boys, MD  Admit date: 08/30/2020 Discharge date: 09/07/2020  Time spent: 35 minutes  Recommendations for Outpatient Follow-up:  1. PCP in 1 week 2. SNF for rehabilitation   Discharge Diagnoses:  Active Problems:   Septic shock (HCC) Severe C. difficile colitis Dehydration Acute kidney injury Paroxysmal atrial fibrillation Morbid obesity Debility Hypothyroidism  Discharge Condition: Stable  Diet recommendation: Low-sodium  Filed Weights   09/04/20 0538 09/07/20 0500 09/08/20 0624  Weight: 121.7 kg 119.3 kg 123.8 kg    History of present illness:  65 year old female with history of paroxysmal A. fib on flecainide and Eliquis, thoracic aortic aneurysm, obesity, endometriosis, hypothyroidism, history of PPM for tachybradycardia syndrome presented to the ED with abdominal pain fevers and profuse diarrhea, recently discharged from [facility] after treatment for cellulitis, she received clindamycin and IV vancomycin and was discharged home on Keflex.  Few days after discharge started having profuse foul-smelling diarrhea -In the ED she was hypotensive, CT noted pancolitis treated with 3.5 L of fluid resuscitation followed by [facility] Course:   Septic shock Severe C. difficile colitis Profound dehydration, diarrhea -Clinically improving slowly, on admission required Levophed and aggressive fluid resuscitation -Started improving on oral vancomycin, day 7 today, continue this for 1 more week to complete 14-day course -PT eval completed, SNF recommended -Discharge to SNF for rehabilitation  Acute kidney injury -Secondary to septic shock and dehydration -Improving, creatinine was 2.1 on admission and baseline is 0.9 -resolved, creatinine has normalized, Cozaar resumed at a lower dose  Hypertension -Hypotensive on admission, improved and stable now,  hydralazine and Coreg resumed, Cozaar resumed at lower dose along with low dose lasix  Hypothyroidism -Continue Synthroid  Paroxysmal atrial fibrillation -Continue flecainide and Eliquis -Continue carvedilol, dose decreased  Morbid obesity, BMI>40 Debility -Needs lifestyle modification -SNF for rehabilitation   Discharge Exam: Vitals:   09/08/20 0500 09/08/20 0732  BP: (!) 169/77 (!) 179/80  Pulse: 66 64  Resp: 17 16  Temp: 98.2 F (36.8 C) 97.7 F (36.5 C)  SpO2: 100% 98%    General: AAOx3, morbidly obese Cardiovascular: S1S2/RRR Respiratory: CTAB  Discharge Instructions    Allergies as of 09/08/2020      Reactions   Amlodipine Other (See Comments)   dizziness   Penicillins Itching   Tape Rash      Medication List    STOP taking these medications   cephALEXin 500 MG capsule Commonly known as: KEFLEX   methocarbamol 500 MG tablet Commonly known as: ROBAXIN   metoCLOPramide 5 MG tablet Commonly known as: REGLAN   omeprazole 20 MG capsule Commonly known as: PRILOSEC   oxyCODONE 5 MG immediate release tablet Commonly known as: Oxy IR/ROXICODONE   Potassium Chloride ER 20 MEQ Tbcr   tiZANidine 2 MG tablet Commonly known as: ZANAFLEX     TAKE these medications   allopurinol 100 MG tablet Commonly known as: ZYLOPRIM Take 100 mg by mouth 2 (two) times daily.   carvedilol 12.5 MG tablet Commonly known as: COREG Take 1 tablet (12.5 mg total) by mouth 2 (two) times daily. What changed:   medication strength  how much to take   colchicine 0.6 MG tablet Take 0.6 mg by mouth as needed.   Eliquis 5 MG Tabs tablet Generic drug: apixaban Take 1 tablet by mouth 2 (two) times daily.   famotidine 20 MG tablet Commonly known as: PEPCID Take 1 tablet (20 mg  total) by mouth at bedtime.   flecainide 100 MG tablet Commonly known as: TAMBOCOR Take 100 mg by mouth 2 (two) times daily.   furosemide 40 MG tablet Commonly known as: LASIX Take  0.5 tablets (20 mg total) by mouth daily. What changed: how much to take   hydrALAZINE 25 MG tablet Commonly known as: APRESOLINE Take 1 tablet (25 mg total) by mouth 2 (two) times daily. What changed:   medication strength  how much to take   levothyroxine 175 MCG tablet Commonly known as: SYNTHROID Take 175 mcg by mouth daily before breakfast.   losartan 100 MG tablet Commonly known as: COZAAR Take 0.5 tablets (50 mg total) by mouth daily. What changed:   how much to take  when to take this   oxyCODONE-acetaminophen 5-325 MG tablet Commonly known as: PERCOCET/ROXICET Take 1 tablet by mouth every 8 (eight) hours as needed.   vancomycin 50 mg/mL  oral solution Commonly known as: VANCOCIN Take 2.5 mLs (125 mg total) by mouth every 6 (six) hours for 7 days.      Allergies  Allergen Reactions  . Amlodipine Other (See Comments)    dizziness  . Penicillins Itching  . Tape Rash    Contact information for after-discharge care    Destination    [facility] Preferred SNF .   Service: Skilled Nursing Contact information: [address] [place] [phone]                   The results of significant diagnostics from this hospitalization (including imaging, microbiology, ancillary and laboratory) are listed below for reference.    Significant Diagnostic Studies: CT ABDOMEN PELVIS WO CONTRAST  Result Date: 09/01/2020 CLINICAL DATA:  Abdominal distension, pain EXAM: CT ABDOMEN AND PELVIS WITHOUT CONTRAST TECHNIQUE: Multidetector CT imaging of the abdomen and pelvis was performed following the standard protocol without IV contrast. COMPARISON:  08/30/2020 FINDINGS: Lower chest: Pacer wires noted in the right heart. Trace bilateral effusions and dependent/bibasilar atelectasis. Calcified granuloma in the left lung base. Hepatobiliary: No focal hepatic abnormality. Gallbladder unremarkable. Pancreas: No focal abnormality  or ductal dilatation. Spleen: Calcifications throughout.  Normal size. Adrenals/Urinary Tract: No adrenal abnormality. No focal renal abnormality. No stones or hydronephrosis. Urinary bladder is unremarkable. Stomach/Bowel: Diffuse colonic wall thickening again noted, similar to prior study compatible with pancolitis. Diffuse sigmoid diverticulosis. Stomach and small bowel decompressed, unremarkable. Vascular/Lymphatic: Scattered calcifications. No evidence of aneurysm or adenopathy. Reproductive: Prior hysterectomy.  No adnexal masses. Other: No free fluid or free air. Musculoskeletal: Supraumbilical ventral hernia noted containing fat, stable. Just inferior to this hernia are 2 adjacent Josephs ventral hernias containing portions of the transverse colon. Inferior to this is a Scheibel paraumbilical hernia containing multiple small bowel loops. No evidence of bowel obstruction. Appearance of the hernias and containing bowel are unchanged since prior study. IMPRESSION: Multiple ventral hernias as described above, containing portions of the transverse colon and multiple small bowel loops. No evidence of bowel obstruction. Continued wall thickening throughout the colon compatible with pancolitis. Sigmoid diverticulosis. Old granulomatous disease. Trace bilateral effusions. Electronically Signed   By: Rolm Baptise M.D.   On: 09/01/2020 11:45   CT ABDOMEN PELVIS WO CONTRAST  Result Date: 08/30/2020 CLINICAL DATA:  Nonlocalized acute abdominal pain. Nausea vomiting and diarrhea. Fever. Recent hospitalization with antibiotic use. EXAM: CT ABDOMEN AND PELVIS WITHOUT CONTRAST TECHNIQUE: Multidetector CT imaging of the abdomen and pelvis was performed following the standard protocol without IV contrast.  COMPARISON:  Chest x-ray 07/08/2014 FINDINGS: Lower chest: Passive atelectasis in the paravertebral right lower lobe. Calcified left base 8 mm pulmonary nodule consistent with known granuloma. Partially visualized cardiac  leads. Hepatobiliary: The liver is enlarged measuring up to 22 cm. No focal liver abnormality. Nonspecific hydropic gallbladder. No gallstones, gallbladder wall thickening, or pericholecystic fluid. No biliary dilatation. Pancreas: No focal lesion. Normal pancreatic contour. No surrounding inflammatory changes. No main pancreatic ductal dilatation. Spleen: Multiple punctate calcifications likely sequelae of prior granulomatous disease. Normal in size without focal abnormality. Adrenals/Urinary Tract: No adrenal nodule bilaterally. No nephrolithiasis, no hydronephrosis, and no contour-deforming renal mass. No ureterolithiasis or hydroureter. The urinary bladder is unremarkable. Stomach/Bowel: Stomach is within normal limits. No evidence of small bowel wall thickening or dilatation. Extensive circumferential bowel wall thickening of the entire colon. Associated pericolonic fat stranding. Diffuse left colon and sigmoid diverticulosis. The appendix is not definitely identified. Vascular/Lymphatic: No portal venous gas. No mesenteric gas. No abdominal aorta or iliac aneurysm. Mild atherosclerotic plaque of the aorta and its branches. No abdominal, pelvic, or inguinal lymphadenopathy. Reproductive: Borderline enlarged retroperitoneal lymph nodes with no definite lymphadenopathy. Other: No intraperitoneal free fluid. No intraperitoneal free gas. No organized fluid collection. Musculoskeletal: Supraumbilical ventral wall hernia containing fat (6:75, 2:38 with an abdominal defect of 2.4 by 2.1 cm. Inferiorly there are two Dohrmann adjacent hernias containing contiguous loops of transverse colon (2:48, 2:51). The abdominal defects for these hernias measure approximately 4.6 by 4.7 cm (2:47, 6:75) and 2.7 by 1.6 cm (2:49, 6:95). Just inferiorly there is a Stiver paraumbilical hernia containing several loops of small bowel including the terminal ileum with an abdominal defect of 7.4 x 5 cm (6:78). Mesenteric fat stranding is noted  within these hernias. IMPRESSION: 1. Extensive pancolitis with no associated bowel perforation or obstruction. No pneumatosis with however limited evaluation for bowel ischemia on this noncontrast study. Recommend correlation with stool cultures. 2. Total of 4 complex ventral wall hernias with the 3 most inferior ones containing small and Adames bowel. Associated mesenteric fat stranding. Associated ischemia not excluded. Consider correlation with lactate levels. 3. Other imaging findings of potential clinical significance: Hepatomegaly. Sequela of prior granulomatous disease. Aortic Atherosclerosis (ICD10-I70.0). These results were called by telephone at the time of interpretation on 08/30/2020 at 11:24 pm to provider Duffy Bruce , who verbally acknowledged these results. Electronically Signed   By: Iven Finn M.D.   On: 08/30/2020 23:23   DG Chest Port 1 View  Result Date: 08/30/2020 CLINICAL DATA:  Questionable sepsis. EXAM: PORTABLE CHEST 1 VIEW.  Patient is rotated. COMPARISON:  chest x-ray 05/29/2017 FINDINGS: Left chest wall cardiac pacemaker with 2 leads in grossly appropriate position. Slightly more prominent cardiac silhouette likely due to portable AP technique and patient rotation. The heart size and mediastinal contours are unchanged. Elevated left hemidiaphragm. Similar-appearing left hilar calcified lymph node. Redemonstration of a calcified granuloma at the left base. No focal consolidation. No pulmonary edema. No pleural effusion. No pneumothorax. No acute osseous abnormality. IMPRESSION: No active disease in a patient status post granulomatous disease involving the left lung. Electronically Signed   By: Iven Finn M.D.   On: 08/30/2020 22:45    Microbiology: Recent Results (from the past 240 hour(s))  Resp Panel by RT-PCR (Flu A&B, Covid) Nasopharyngeal Swab     Status: None   Collection Time: 08/30/20 10:12 PM   Specimen: Nasopharyngeal Swab; Nasopharyngeal(NP) swabs in vial  transport medium  Result Value Ref Range Status   SARS Coronavirus  2 by RT PCR NEGATIVE NEGATIVE Final    Comment: (NOTE) SARS-CoV-2 target nucleic acids are NOT DETECTED.  The SARS-CoV-2 RNA is generally detectable in upper respiratory specimens during the acute phase of infection. The lowest concentration of SARS-CoV-2 viral copies this assay can detect is 138 copies/mL. A negative result does not preclude SARS-Cov-2 infection and should not be used as the sole basis for treatment or other patient management decisions. A negative result may occur with  improper specimen collection/handling, submission of specimen other than nasopharyngeal swab, presence of viral mutation(s) within the areas targeted by this assay, and inadequate number of viral copies(<138 copies/mL). A negative result must be combined with clinical observations, patient history, and epidemiological information. The expected result is Negative.  Fact Sheet for Patients:  EntrepreneurPulse.com.au  Fact Sheet for Healthcare Providers:  IncredibleEmployment.be  This test is no t yet approved or cleared by the [place] FDA and  has been authorized for detection and/or diagnosis of SARS-CoV-2 by FDA under an Emergency Use Authorization (EUA). This EUA will remain  in effect (meaning this test can be used) for the duration of the COVID-19 declaration under Section 564(b)(1) of the Act, 21 U.S.C.section 360bbb-3(b)(1), unless the authorization is terminated  or revoked sooner.       Influenza A by PCR NEGATIVE NEGATIVE Final   Influenza B by PCR NEGATIVE NEGATIVE Final    Comment: (NOTE) The Xpert Xpress SARS-CoV-2/FLU/RSV plus assay is intended as an aid in the diagnosis of influenza from Nasopharyngeal swab specimens and should not be used as a sole basis for treatment. Nasal washings and aspirates are unacceptable for Xpert Xpress SARS-CoV-2/FLU/RSV testing.  Fact  Sheet for Patients: EntrepreneurPulse.com.au  Fact Sheet for Healthcare Providers: IncredibleEmployment.be  This test is not yet approved or cleared by the [place] FDA and has been authorized for detection and/or diagnosis of SARS-CoV-2 by FDA under an Emergency Use Authorization (EUA). This EUA will remain in effect (meaning this test can be used) for the duration of the COVID-19 declaration under Section 564(b)(1) of the Act, 21 U.S.C. section 360bbb-3(b)(1), unless the authorization is terminated or revoked.  Performed at [facility], [address] [place], [place] [address]   Blood Culture (routine x 2)     Status: None   Collection Time: 08/30/20 10:12 PM   Specimen: BLOOD  Result Value Ref Range Status   Specimen Description BLOOD BLOOD LEFT HAND  Final   Special Requests   Final    BOTTLES DRAWN AEROBIC AND ANAEROBIC Blood Culture adequate volume   Culture   Final    NO GROWTH 5 DAYS Performed at [facility], [address] [place], [place] [address]    Report Status 09/04/2020 FINAL  Final  Blood Culture (routine x 2)     Status: None   Collection Time: 08/30/20 10:14 PM   Specimen: BLOOD  Result Value Ref Range Status   Specimen Description BLOOD BLOOD LEFT FOREARM  Final   Special Requests   Final    BOTTLES DRAWN AEROBIC AND ANAEROBIC Blood Culture adequate volume   Culture   Final    NO GROWTH 5 DAYS Performed at [facility], [address] [place], [place] [address]    Report Status 09/04/2020 FINAL  Final  C Difficile Quick Screen w PCR reflex     Status: Abnormal   Collection Time: 08/31/20  1:10 AM   Specimen: STOOL  Result Value Ref Range Status   C Diff  antigen POSITIVE (A) NEGATIVE Final    Comment: CRITICAL RESULT CALLED TO, READ BACK BY AND VERIFIED WITH: BETH JONES AT 0235 08/30/20 MF    C Diff toxin POSITIVE (A) NEGATIVE Final    Comment: CRITICAL RESULT CALLED TO, READ  BACK BY AND VERIFIED WITH: BETH JONES AT 3016 08/30/20 MF    C Diff interpretation Toxin producing C. difficile detected.  Final    Comment: Performed at [facility], [address] [place], [place] [address]  Gastrointestinal Panel by PCR , Stool     Status: None   Collection Time: 08/31/20  1:10 AM   Specimen: Stool  Result Value Ref Range Status   Campylobacter species NOT DETECTED NOT DETECTED Final   Plesimonas shigelloides NOT DETECTED NOT DETECTED Final   Salmonella species NOT DETECTED NOT DETECTED Final   Yersinia enterocolitica NOT DETECTED NOT DETECTED Final   Vibrio species NOT DETECTED NOT DETECTED Final   Vibrio cholerae NOT DETECTED NOT DETECTED Final   Enteroaggregative E coli (EAEC) NOT DETECTED NOT DETECTED Final   Enteropathogenic E coli (EPEC) NOT DETECTED NOT DETECTED Final   Enterotoxigenic E coli (ETEC) NOT DETECTED NOT DETECTED Final   Shiga like toxin producing E coli (STEC) NOT DETECTED NOT DETECTED Final   Shigella/Enteroinvasive E coli (EIEC) NOT DETECTED NOT DETECTED Final   Cryptosporidium NOT DETECTED NOT DETECTED Final   Cyclospora cayetanensis NOT DETECTED NOT DETECTED Final   Entamoeba histolytica NOT DETECTED NOT DETECTED Final   Giardia lamblia NOT DETECTED NOT DETECTED Final   Adenovirus F40/41 NOT DETECTED NOT DETECTED Final   Astrovirus NOT DETECTED NOT DETECTED Final   Norovirus GI/GII NOT DETECTED NOT DETECTED Final   Rotavirus A NOT DETECTED NOT DETECTED Final   Sapovirus (I, II, IV, and V) NOT DETECTED NOT DETECTED Final    Comment: Performed at [facility], [address] [place], [place] [address]  MRSA PCR Screening     Status: None   Collection Time: 08/31/20  1:20 AM  Result Value Ref Range Status   MRSA by PCR NEGATIVE NEGATIVE Final    Comment:        The GeneXpert MRSA Assay (FDA approved for NASAL specimens only), is one component of a comprehensive MRSA colonization surveillance program. It is  not intended to diagnose MRSA infection nor to guide or monitor treatment for MRSA infections. Performed at [facility], [address] [place], [place] [address]   Urine culture     Status: None   Collection Time: 08/31/20  4:28 PM   Specimen: In/Out Cath Urine  Result Value Ref Range Status   Specimen Description   Final    IN/OUT CATH URINE Performed at [facility], [address] [place], [place] [address]    Special Requests   Final    NONE Performed at [facility], [address], [place], [place] [address]    Culture   Final    NO GROWTH Performed at [facility], [place] [address] [place], [place] [address]    Report Status 09/02/2020 FINAL  Final     Labs: Basic Metabolic Panel: Recent Labs  Lab 09/02/20 0439 09/03/20 0400 09/04/20 0553  NA 133* 136 137  K 4.3 4.0 4.5  CL 107 110 110  CO2 21* 23 23  GLUCOSE 95 109* 95  BUN 45* 42* 33*  CREATININE 1.31* 1.11* 0.97  CALCIUM 7.6* 7.5* 7.6*  MG 2.2  --   --  PHOS 3.1  --   --    Liver Function Tests: No results for input(s): AST, ALT, ALKPHOS, BILITOT, PROT, ALBUMIN in the last 168 hours. No results for input(s): LIPASE, AMYLASE in the last 168 hours. No results for input(s): AMMONIA in the last 168 hours. CBC: Recent Labs  Lab 09/02/20 0439 09/03/20 0400 09/04/20 0553 09/06/20 0516  WBC 26.9* 13.3* 13.2* 9.4  HGB 10.2* 10.6* 11.1* 10.4*  HCT 31.1* 32.1* 33.5* 31.4*  MCV 100.6* 100.3* 100.6* 100.0  PLT 260 290 328 288   Cardiac Enzymes: No results for input(s): CKTOTAL, CKMB, CKMBINDEX, TROPONINI in the last 168 hours. BNP: BNP (last 3 results) No results for input(s): BNP in the last 8760 hours.  ProBNP (last 3 results) No results for input(s): PROBNP in the last 8760 hours.  CBG: Recent Labs  Lab 09/02/20 1545 09/02/20 2102 09/03/20 0035 09/03/20 0541 09/03/20 0754  GLUCAP 117* 101* 105* 101* 80       Signed:  Domenic Polite MD.   [facility] 09/08/2020, 10:08 AM

## 2020-09-08 DIAGNOSIS — E872 Acidosis: Secondary | ICD-10-CM

## 2020-09-08 DIAGNOSIS — K5100 Ulcerative (chronic) pancolitis without complications: Secondary | ICD-10-CM

## 2020-09-08 MED ORDER — VANCOMYCIN 50 MG/ML ORAL SOLUTION: 125.0000 mg | Freq: Four times a day (QID) | ORAL | 0 refills | Status: DC

## 2020-09-08 MED ORDER — LOSARTAN POTASSIUM 100 MG PO TABS: 50.0000 mg | ORAL_TABLET | Freq: Every day | ORAL | Status: DC

## 2020-09-08 MED ORDER — VANCOMYCIN 50 MG/ML ORAL SOLUTION
125.0000 mg | Freq: Four times a day (QID) | ORAL | Status: DC
  Administered 2020-09-08 – 2020-09-12 (×17): 125 mg via ORAL
  Filled 2020-09-08 (×23): qty 2.5

## 2020-09-08 MED ORDER — FUROSEMIDE 20 MG PO TABS
20.0000 mg | ORAL_TABLET | Freq: Every day | ORAL | Status: DC
  Administered 2020-09-08 – 2020-09-12 (×5): 20 mg via ORAL
  Filled 2020-09-08 (×5): qty 1

## 2020-09-08 MED ORDER — LOSARTAN POTASSIUM 50 MG PO TABS
50.0000 mg | ORAL_TABLET | Freq: Every day | ORAL | Status: DC
  Administered 2020-09-08 – 2020-09-10 (×3): 50 mg via ORAL
  Filled 2020-09-08 (×3): qty 1

## 2020-09-08 MED ORDER — FUROSEMIDE 40 MG PO TABS: 20.0000 mg | ORAL_TABLET | Freq: Every day | ORAL | Status: DC

## 2020-09-08 MED ORDER — LOPERAMIDE HCL 2 MG PO CAPS
2.0000 mg | ORAL_CAPSULE | Freq: Once | ORAL | Status: AC
  Administered 2020-09-08: 11:00:00 2 mg via ORAL
  Filled 2020-09-08: qty 1

## 2020-09-08 NOTE — Plan of Care (Signed)
End of Shift Summary:   Alert and oriented x4. VSS. Afebrile. Remained on room air. Several type 6 stools overnight, pt tearful about still having diarrhea and complications from it -- MASD and hemorrhoids. Ambulated with pt in hallways with walker, noted some DOE. Pain managed with prn medications. Denies n/v. Endorses a good appetite. Remained free from falls or injury. Refusing bed alarm, educated and encouraged to call for assistance. Call bell within reach.   Problem: Education: Goal: Knowledge of General Education information will improve Description: Including pain rating scale, medication(s)/side effects and non-pharmacologic comfort measures Outcome: Progressing   Problem: Health Behavior/Discharge Planning: Goal: Ability to manage health-related needs will improve Outcome: Progressing   Problem: Clinical Measurements: Goal: Ability to maintain clinical measurements within normal limits will improve Outcome: Progressing   Problem: Clinical Measurements: Goal: Will remain free from infection Outcome: Progressing   Problem: Activity: Goal: Risk for activity intolerance will decrease Outcome: Progressing   Problem: Nutrition: Goal: Adequate nutrition will be maintained Outcome: Progressing   Problem: Coping: Goal: Level of anxiety will decrease Outcome: Progressing   Problem: Elimination: Goal: Will not experience complications related to bowel motility Outcome: Progressing   Problem: Skin Integrity: Goal: Risk for impaired skin integrity will decrease Outcome: Progressing   Problem: Safety: Goal: Ability to remain free from injury will improve Outcome: Progressing   Problem: Pain Managment: Goal: General experience of comfort will improve Outcome: Progressing

## 2020-09-08 NOTE — Progress Notes (Signed)
Occupational Therapy Treatment Patient Details Name: [name] [name] MRN: [id number] DOB: 04/01/1956 Today's Date: 09/08/2020    History of present illness Per MD note: 65 year old female with history of paroxysmal A. fib on flecainide and Eliquis, thoracic aortic aneurysm, obesity, endometriosis, hypothyroidism, history of PPM for tachybradycardia syndrome presented to the ED with abdominal pain fevers and profuse diarrhea, recently discharged from [facility] after treatment for cellulitis, she received clindamycin and IV vancomycin and was discharged home on Keflex.  Few days after discharge started having profuse foul-smelling diarrhea.   OT comments  Upon entering the room, pt standing with NT and heading to bathroom for shower. Pt transitioned easily to session. IV site covered for safety. Pt ambulates with RW and close supervision for safety. Pt transferred into shower and seated on 3 in 1 for bathing tasks. Pt did need to stand with min A in shower to thoroughly wash buttocks and peri area secondary to body habitus. Pt drying while seated on 3 in 1 before exiting the shower for safety. Pt ambulating back to bed and seated on EOB to rest before continuing with grooming and dressing tasks. OT began energy conservation education but to continue next session. Pt returning to bed to rest secondary to fatigue. All needs within reach upon exiting the room.   Follow Up Recommendations  SNF;Supervision - Intermittent    Equipment Recommendations  Other (comment) (defer to next venue of care)       Precautions / Restrictions Precautions Precautions: Fall       Mobility Bed Mobility Overal bed mobility: Needs Assistance Bed Mobility: Sit to Supine       Sit to supine: Supervision   General bed mobility comments: no physical assistance needed. Min cuing for technique    Transfers Overall transfer level: Needs assistance Equipment used: Rolling walker (2  wheeled) Transfers: Stand Pivot Transfers Sit to Stand: Supervision Stand pivot transfers: Supervision       General transfer comment: supervision overall with use of RW and cuing for safety awareness    Balance Overall balance assessment: Needs assistance Sitting-balance support: Feet supported Sitting balance-Leahy Scale: Good     Standing balance support: Bilateral upper extremity supported Standing balance-Leahy Scale: Fair Standing balance comment: reliance on RW for UE support                           ADL either performed or assessed with clinical judgement   ADL Overall ADL's : Needs assistance/impaired     Grooming: Wash/dry hands;Wash/dry face;Supervision/safety;Standing   Upper Body Bathing: Supervision/ safety;Set up;Standing Upper Body Bathing Details (indicate cue type and reason): hospital gown         Lower Body Dressing: Minimal assistance;Sit to/from stand           Tub/ Shower Transfer: Walk-in shower;3 in 1;Minimal assistance;Rolling walker;Ambulation   Functional mobility during ADLs: Min guard;Rolling walker;Supervision/safety       Vision Baseline Vision/History: Wears glasses Patient Visual Report: No change from baseline            Cognition Arousal/Alertness: Awake/alert Behavior During Therapy: WFL for tasks assessed/performed Overall Cognitive Status: Within Functional Limits for tasks assessed  Pertinent Vitals/ Pain       Pain Assessment: No/denies pain         Frequency  Min 2X/week        Progress Toward Goals  OT Goals(current goals can now be found in the care plan section)  Progress towards OT goals: Progressing toward goals  Acute Rehab OT Goals Patient Stated Goal: to get stronger OT Goal Formulation: With patient Time For Goal Achievement: 09/18/20 Potential to Achieve Goals: Good  Plan Discharge plan remains  appropriate;Frequency remains appropriate       AM-PAC OT "6 Clicks" Daily Activity     Outcome Measure   Help from another person eating meals?: None Help from another person taking care of personal grooming?: A Little Help from another person toileting, which includes using toliet, bedpan, or urinal?: A Little Help from another person bathing (including washing, rinsing, drying)?: A Little Help from another person to put on and taking off regular upper body clothing?: None Help from another person to put on and taking off regular lower body clothing?: A Little 6 Click Score: 20    End of Session    OT Visit Diagnosis: Unsteadiness on feet (R26.81);History of falling (Z91.81);Repeated falls (R29.6);Muscle weakness (generalized) (M62.81)   Activity Tolerance Patient tolerated treatment well   Patient Left in bed;with call bell/phone within reach;with bed alarm set   Nurse Communication Mobility status        Time: 6734-1937 OT Time Calculation (min): 53 min  Charges: OT General Charges $OT Visit: 1 Visit OT Treatments $Self Care/Home Management : 53-67 mins  Darleen Crocker, MS, OTR/L , CBIS ascom [phone]  09/08/20, 12:50 PM

## 2020-09-08 NOTE — Progress Notes (Signed)
Physical Therapy Treatment Patient Details Name: [name] [name] MRN: [id number] DOB: 1955-09-08 Today's Date: 09/08/2020    History of Present Illness Per MD note: 65 year old female with history of paroxysmal A. fib on flecainide and Eliquis, thoracic aortic aneurysm, obesity, endometriosis, hypothyroidism, history of PPM for tachybradycardia syndrome presented to the ED with abdominal pain fevers and profuse diarrhea, recently discharged from [facility] after treatment for cellulitis, she received clindamycin and IV vancomycin and was discharged home on Keflex.  Few days after discharge started having profuse foul-smelling diarrhea.    PT Comments    Pt sleeping in bed upon PT arrival but easily woken; pt reporting being tired from taking a shower earlier.  Pt requesting to toilet so pt ambulated to bathroom with RW SBA.  After toileting, pt able to ambulate 150 feet in room with RW SBA.  Pt appearing anxious and talked for an extended period of time about concerns for discharging home (pt reporting unable to take care of her mom at home and not sure anyone can help her at home much less her mom as well); pt concerned about her weakness and activity tolerance as well as having diarrhea at home.  Therapist discussed modifications at home to improve ability to manage at home.  Pt may benefit from SNF to improve overall strength and activity tolerance in order to safely manage entire day of required activities at home but anticipate pt could discharge home if had appropriate assistance set-up to help with daily activities.   Follow Up Recommendations  SNF     Equipment Recommendations  Rolling walker with 5" wheels    Recommendations for Other Services       Precautions / Restrictions Precautions Precautions: Fall Restrictions Weight Bearing Restrictions: No    Mobility  Bed Mobility Overal bed mobility: Modified Independent Bed Mobility: Supine to Sit;Sit to Supine      Supine to sit: Modified independent (Device/Increase time) Sit to supine: Modified independent (Device/Increase time)   General bed mobility comments: no physical assistance required; increased time to perform on own    Transfers Overall transfer level: Needs assistance Equipment used: Rolling walker (2 wheeled) Transfers: Sit to/from Stand Sit to Stand: Supervision Stand pivot transfers: Supervision;Min guard       General transfer comment: x1 trial standing from bed (SBA) and x1 trial standing from toilet (CGA) in bathroom (increased effort to stand from low toilet in bathroom with use of grab bar in one UE and pushing off of toilet with other UE)  Ambulation/Gait Ambulation/Gait assistance: Supervision Gait Distance (Feet):  (25 feet (to bathroom); 150 feet) Assistive device: Rolling walker (2 wheeled)   Gait velocity: decreased   General Gait Details: partial step through gait pattern; steady with RW; decreased B LE foot clearance   Stairs             Wheelchair Mobility    Modified Rankin (Stroke Patients Only)       Balance Overall balance assessment: Needs assistance Sitting-balance support: No upper extremity supported;Feet supported Sitting balance-Leahy Scale: Normal Sitting balance - Comments: steady sitting reaching outside BOS   Standing balance support: No upper extremity supported Standing balance-Leahy Scale: Good Standing balance comment: steady standing washing hands at sink                            Cognition Arousal/Alertness: Awake/alert Behavior During Therapy: Anxious Overall Cognitive Status: Within Functional Limits for tasks assessed  Exercises      General Comments General comments (skin integrity, edema, etc.): hematoma and scabbing to R knee; scabbing to forehead.  Nursing cleared pt for participation in physical therapy.  Pt agreeable to PT session.       Pertinent Vitals/Pain Pain Assessment: No/denies pain Pain Intervention(s): Limited activity within patient's tolerance;Monitored during session;Repositioned  HR stable and WFL throughout treatment session.    Home Living                      Prior Function            PT Goals (current goals can now be found in the care plan section) Acute Rehab PT Goals Patient Stated Goal: to get stronger PT Goal Formulation: With patient Time For Goal Achievement: 09/17/20 Potential to Achieve Goals: Fair Progress towards PT goals: Progressing toward goals    Frequency    Min 2X/week      PT Plan Other (comment)    Co-evaluation              AM-PAC PT "6 Clicks" Mobility   Outcome Measure  Help needed turning from your back to your side while in a flat bed without using bedrails?: None Help needed moving from lying on your back to sitting on the side of a flat bed without using bedrails?: None Help needed moving to and from a bed to a chair (including a wheelchair)?: A Little Help needed standing up from a chair using your arms (e.g., wheelchair or bedside chair)?: A Little Help needed to walk in hospital room?: A Little Help needed climbing 3-5 steps with a railing? : A Little 6 Click Score: 20    End of Session   Activity Tolerance: Patient limited by fatigue Patient left: in bed;with call bell/phone within reach (no bed alarm on upon PT arrival (pt has been transferring to [facility] on own for toileting needs)) Nurse Communication: Mobility status PT Visit Diagnosis: Unsteadiness on feet (R26.81);Other abnormalities of gait and mobility (R26.89);Muscle weakness (generalized) (M62.81);History of falling (Z91.81);Difficulty in walking, not elsewhere classified (R26.2);Repeated falls (R29.6)     Time: 1404-1500 PT Time Calculation (min) (ACUTE ONLY): 56 min  Charges:  $Therapeutic Exercise: 38-52 mins $Therapeutic Activity: 8-22 mins                    Leitha Bleak, PT 09/08/20, 3:54 PM

## 2020-09-08 NOTE — Discharge Summary (Signed)
Physician Discharge Summary  [name] [name] [id number] DOB: [birth year] DOA: 08/30/2020  PCP: Barbaraann Boys, MD  Admit date: 08/30/2020 Discharge date: 09/09/2020  Time spent: 35 minutes  Recommendations for Outpatient Follow-up:  1. PCP in 1 week 2. SNF for rehabilitation   Discharge Diagnoses:  Active Problems:   Septic shock (HCC) Severe C. difficile colitis Dehydration Acute kidney injury Paroxysmal atrial fibrillation Morbid obesity Debility Hypothyroidism  Discharge Condition: Stable  Diet recommendation: Low-sodium  Filed Weights   09/04/20 0538 09/07/20 0500 09/08/20 0624  Weight: 121.7 kg 119.3 kg 123.8 kg    History of present illness:  65 year old female with history of paroxysmal A. fib on flecainide and Eliquis, thoracic aortic aneurysm, obesity, endometriosis, hypothyroidism, history of PPM for tachybradycardia syndrome presented to the ED with abdominal pain fevers and profuse diarrhea, recently discharged from [facility] after treatment for cellulitis, she received clindamycin and IV vancomycin and was discharged home on Keflex.  Few days after discharge started having profuse foul-smelling diarrhea -In the ED she was hypotensive, CT noted pancolitis treated with 3.5 L of fluid resuscitation followed by [facility] Course:   Septic shock Severe C. difficile colitis Profound dehydration, diarrhea -Clinically improving slowly, on admission required Levophed and aggressive fluid resuscitation -Started improving on oral vancomycin, day 8 today, continue this for 6 more days to complete 14-day course -PT eval completed, SNF recommended -Discharge to SNF for rehabilitation  Acute kidney injury -Secondary to septic shock and dehydration -Improving, creatinine was 2.1 on admission and baseline is 0.9 -resolved, creatinine has normalized, Cozaar resumed at a lower dose  Hypertension -Hypotensive on admission, improved and stable now,  hydralazine and Coreg resumed, Cozaar resumed at lower dose along with low dose lasix  Hypothyroidism -Continue Synthroid  Paroxysmal atrial fibrillation -Continue flecainide and Eliquis -Continue carvedilol, dose decreased  Morbid obesity, BMI>40 Debility -Needs lifestyle modification -SNF for rehabilitation   Discharge Exam: Vitals:   09/08/20 0500 09/08/20 0732  BP: (!) 169/77 (!) 179/80  Pulse: 66 64  Resp: 17 16  Temp: 98.2 F (36.8 C) 97.7 F (36.5 C)  SpO2: 100% 98%    General: AAOx3, morbidly obese Cardiovascular: S1S2/RRR Respiratory: CTAB  Discharge Instructions    Allergies as of 09/08/2020      Reactions   Amlodipine Other (See Comments)   dizziness   Penicillins Itching   Tape Rash      Medication List    STOP taking these medications   cephALEXin 500 MG capsule Commonly known as: KEFLEX   methocarbamol 500 MG tablet Commonly known as: ROBAXIN   metoCLOPramide 5 MG tablet Commonly known as: REGLAN   omeprazole 20 MG capsule Commonly known as: PRILOSEC   oxyCODONE 5 MG immediate release tablet Commonly known as: Oxy IR/ROXICODONE   Potassium Chloride ER 20 MEQ Tbcr   tiZANidine 2 MG tablet Commonly known as: ZANAFLEX     TAKE these medications   allopurinol 100 MG tablet Commonly known as: ZYLOPRIM Take 100 mg by mouth 2 (two) times daily.   carvedilol 12.5 MG tablet Commonly known as: COREG Take 1 tablet (12.5 mg total) by mouth 2 (two) times daily. What changed:   medication strength  how much to take   colchicine 0.6 MG tablet Take 0.6 mg by mouth as needed.   Eliquis 5 MG Tabs tablet Generic drug: apixaban Take 1 tablet by mouth 2 (two) times daily.   famotidine 20 MG tablet Commonly known as: PEPCID Take 1 tablet (20 mg  total) by mouth at bedtime.   flecainide 100 MG tablet Commonly known as: TAMBOCOR Take 100 mg by mouth 2 (two) times daily.   furosemide 40 MG tablet Commonly known as: LASIX Take  0.5 tablets (20 mg total) by mouth daily. What changed: how much to take   hydrALAZINE 25 MG tablet Commonly known as: APRESOLINE Take 1 tablet (25 mg total) by mouth 2 (two) times daily. What changed:   medication strength  how much to take   levothyroxine 175 MCG tablet Commonly known as: SYNTHROID Take 175 mcg by mouth daily before breakfast.   losartan 100 MG tablet Commonly known as: COZAAR Take 0.5 tablets (50 mg total) by mouth daily. What changed:   how much to take  when to take this   oxyCODONE-acetaminophen 5-325 MG tablet Commonly known as: PERCOCET/ROXICET Take 1 tablet by mouth every 8 (eight) hours as needed.   vancomycin 50 mg/mL  oral solution Commonly known as: VANCOCIN Take 2.5 mLs (125 mg total) by mouth every 6 (six) hours for 6 days.      Allergies  Allergen Reactions  . Amlodipine Other (See Comments)    dizziness  . Penicillins Itching  . Tape Rash    Contact information for after-discharge care    Destination    [facility] Preferred SNF .   Service: Skilled Nursing Contact information: [address] [place] [phone]                   The results of significant diagnostics from this hospitalization (including imaging, microbiology, ancillary and laboratory) are listed below for reference.    Significant Diagnostic Studies: CT ABDOMEN PELVIS WO CONTRAST  Result Date: 09/01/2020 CLINICAL DATA:  Abdominal distension, pain EXAM: CT ABDOMEN AND PELVIS WITHOUT CONTRAST TECHNIQUE: Multidetector CT imaging of the abdomen and pelvis was performed following the standard protocol without IV contrast. COMPARISON:  08/30/2020 FINDINGS: Lower chest: Pacer wires noted in the right heart. Trace bilateral effusions and dependent/bibasilar atelectasis. Calcified granuloma in the left lung base. Hepatobiliary: No focal hepatic abnormality. Gallbladder unremarkable. Pancreas: No focal abnormality  or ductal dilatation. Spleen: Calcifications throughout.  Normal size. Adrenals/Urinary Tract: No adrenal abnormality. No focal renal abnormality. No stones or hydronephrosis. Urinary bladder is unremarkable. Stomach/Bowel: Diffuse colonic wall thickening again noted, similar to prior study compatible with pancolitis. Diffuse sigmoid diverticulosis. Stomach and small bowel decompressed, unremarkable. Vascular/Lymphatic: Scattered calcifications. No evidence of aneurysm or adenopathy. Reproductive: Prior hysterectomy.  No adnexal masses. Other: No free fluid or free air. Musculoskeletal: Supraumbilical ventral hernia noted containing fat, stable. Just inferior to this hernia are 2 adjacent Alwin ventral hernias containing portions of the transverse colon. Inferior to this is a Gullion paraumbilical hernia containing multiple small bowel loops. No evidence of bowel obstruction. Appearance of the hernias and containing bowel are unchanged since prior study. IMPRESSION: Multiple ventral hernias as described above, containing portions of the transverse colon and multiple small bowel loops. No evidence of bowel obstruction. Continued wall thickening throughout the colon compatible with pancolitis. Sigmoid diverticulosis. Old granulomatous disease. Trace bilateral effusions. Electronically Signed   By: Rolm Baptise M.D.   On: 09/01/2020 11:45   CT ABDOMEN PELVIS WO CONTRAST  Result Date: 08/30/2020 CLINICAL DATA:  Nonlocalized acute abdominal pain. Nausea vomiting and diarrhea. Fever. Recent hospitalization with antibiotic use. EXAM: CT ABDOMEN AND PELVIS WITHOUT CONTRAST TECHNIQUE: Multidetector CT imaging of the abdomen and pelvis was performed following the standard protocol without IV contrast.  COMPARISON:  Chest x-ray 07/08/2014 FINDINGS: Lower chest: Passive atelectasis in the paravertebral right lower lobe. Calcified left base 8 mm pulmonary nodule consistent with known granuloma. Partially visualized cardiac  leads. Hepatobiliary: The liver is enlarged measuring up to 22 cm. No focal liver abnormality. Nonspecific hydropic gallbladder. No gallstones, gallbladder wall thickening, or pericholecystic fluid. No biliary dilatation. Pancreas: No focal lesion. Normal pancreatic contour. No surrounding inflammatory changes. No main pancreatic ductal dilatation. Spleen: Multiple punctate calcifications likely sequelae of prior granulomatous disease. Normal in size without focal abnormality. Adrenals/Urinary Tract: No adrenal nodule bilaterally. No nephrolithiasis, no hydronephrosis, and no contour-deforming renal mass. No ureterolithiasis or hydroureter. The urinary bladder is unremarkable. Stomach/Bowel: Stomach is within normal limits. No evidence of small bowel wall thickening or dilatation. Extensive circumferential bowel wall thickening of the entire colon. Associated pericolonic fat stranding. Diffuse left colon and sigmoid diverticulosis. The appendix is not definitely identified. Vascular/Lymphatic: No portal venous gas. No mesenteric gas. No abdominal aorta or iliac aneurysm. Mild atherosclerotic plaque of the aorta and its branches. No abdominal, pelvic, or inguinal lymphadenopathy. Reproductive: Borderline enlarged retroperitoneal lymph nodes with no definite lymphadenopathy. Other: No intraperitoneal free fluid. No intraperitoneal free gas. No organized fluid collection. Musculoskeletal: Supraumbilical ventral wall hernia containing fat (6:75, 2:38 with an abdominal defect of 2.4 by 2.1 cm. Inferiorly there are two Dohrmann adjacent hernias containing contiguous loops of transverse colon (2:48, 2:51). The abdominal defects for these hernias measure approximately 4.6 by 4.7 cm (2:47, 6:75) and 2.7 by 1.6 cm (2:49, 6:95). Just inferiorly there is a Stiver paraumbilical hernia containing several loops of small bowel including the terminal ileum with an abdominal defect of 7.4 x 5 cm (6:78). Mesenteric fat stranding is noted  within these hernias. IMPRESSION: 1. Extensive pancolitis with no associated bowel perforation or obstruction. No pneumatosis with however limited evaluation for bowel ischemia on this noncontrast study. Recommend correlation with stool cultures. 2. Total of 4 complex ventral wall hernias with the 3 most inferior ones containing small and Adames bowel. Associated mesenteric fat stranding. Associated ischemia not excluded. Consider correlation with lactate levels. 3. Other imaging findings of potential clinical significance: Hepatomegaly. Sequela of prior granulomatous disease. Aortic Atherosclerosis (ICD10-I70.0). These results were called by telephone at the time of interpretation on 08/30/2020 at 11:24 pm to provider Duffy Bruce , who verbally acknowledged these results. Electronically Signed   By: Iven Finn M.D.   On: 08/30/2020 23:23   DG Chest Port 1 View  Result Date: 08/30/2020 CLINICAL DATA:  Questionable sepsis. EXAM: PORTABLE CHEST 1 VIEW.  Patient is rotated. COMPARISON:  chest x-ray 05/29/2017 FINDINGS: Left chest wall cardiac pacemaker with 2 leads in grossly appropriate position. Slightly more prominent cardiac silhouette likely due to portable AP technique and patient rotation. The heart size and mediastinal contours are unchanged. Elevated left hemidiaphragm. Similar-appearing left hilar calcified lymph node. Redemonstration of a calcified granuloma at the left base. No focal consolidation. No pulmonary edema. No pleural effusion. No pneumothorax. No acute osseous abnormality. IMPRESSION: No active disease in a patient status post granulomatous disease involving the left lung. Electronically Signed   By: Iven Finn M.D.   On: 08/30/2020 22:45    Microbiology: Recent Results (from the past 240 hour(s))  Resp Panel by RT-PCR (Flu A&B, Covid) Nasopharyngeal Swab     Status: None   Collection Time: 08/30/20 10:12 PM   Specimen: Nasopharyngeal Swab; Nasopharyngeal(NP) swabs in vial  transport medium  Result Value Ref Range Status   SARS Coronavirus  2 by RT PCR NEGATIVE NEGATIVE Final    Comment: (NOTE) SARS-CoV-2 target nucleic acids are NOT DETECTED.  The SARS-CoV-2 RNA is generally detectable in upper respiratory specimens during the acute phase of infection. The lowest concentration of SARS-CoV-2 viral copies this assay can detect is 138 copies/mL. A negative result does not preclude SARS-Cov-2 infection and should not be used as the sole basis for treatment or other patient management decisions. A negative result may occur with  improper specimen collection/handling, submission of specimen other than nasopharyngeal swab, presence of viral mutation(s) within the areas targeted by this assay, and inadequate number of viral copies(<138 copies/mL). A negative result must be combined with clinical observations, patient history, and epidemiological information. The expected result is Negative.  Fact Sheet for Patients:  EntrepreneurPulse.com.au  Fact Sheet for Healthcare Providers:  IncredibleEmployment.be  This test is no t yet approved or cleared by the [place] FDA and  has been authorized for detection and/or diagnosis of SARS-CoV-2 by FDA under an Emergency Use Authorization (EUA). This EUA will remain  in effect (meaning this test can be used) for the duration of the COVID-19 declaration under Section 564(b)(1) of the Act, 21 U.S.C.section 360bbb-3(b)(1), unless the authorization is terminated  or revoked sooner.       Influenza A by PCR NEGATIVE NEGATIVE Final   Influenza B by PCR NEGATIVE NEGATIVE Final    Comment: (NOTE) The Xpert Xpress SARS-CoV-2/FLU/RSV plus assay is intended as an aid in the diagnosis of influenza from Nasopharyngeal swab specimens and should not be used as a sole basis for treatment. Nasal washings and aspirates are unacceptable for Xpert Xpress SARS-CoV-2/FLU/RSV testing.  Fact  Sheet for Patients: EntrepreneurPulse.com.au  Fact Sheet for Healthcare Providers: IncredibleEmployment.be  This test is not yet approved or cleared by the [place] FDA and has been authorized for detection and/or diagnosis of SARS-CoV-2 by FDA under an Emergency Use Authorization (EUA). This EUA will remain in effect (meaning this test can be used) for the duration of the COVID-19 declaration under Section 564(b)(1) of the Act, 21 U.S.C. section 360bbb-3(b)(1), unless the authorization is terminated or revoked.  Performed at [facility], [address] [place], [place] [address]   Blood Culture (routine x 2)     Status: None   Collection Time: 08/30/20 10:12 PM   Specimen: BLOOD  Result Value Ref Range Status   Specimen Description BLOOD BLOOD LEFT HAND  Final   Special Requests   Final    BOTTLES DRAWN AEROBIC AND ANAEROBIC Blood Culture adequate volume   Culture   Final    NO GROWTH 5 DAYS Performed at [facility], [address] [place], [place] [address]    Report Status 09/04/2020 FINAL  Final  Blood Culture (routine x 2)     Status: None   Collection Time: 08/30/20 10:14 PM   Specimen: BLOOD  Result Value Ref Range Status   Specimen Description BLOOD BLOOD LEFT FOREARM  Final   Special Requests   Final    BOTTLES DRAWN AEROBIC AND ANAEROBIC Blood Culture adequate volume   Culture   Final    NO GROWTH 5 DAYS Performed at [facility], [address] [place], [place] [address]    Report Status 09/04/2020 FINAL  Final  C Difficile Quick Screen w PCR reflex     Status: Abnormal   Collection Time: 08/31/20  1:10 AM   Specimen: STOOL  Result Value Ref Range Status   C Diff  antigen POSITIVE (A) NEGATIVE Final    Comment: CRITICAL RESULT CALLED TO, READ BACK BY AND VERIFIED WITH: BETH JONES AT 0235 08/30/20 MF    C Diff toxin POSITIVE (A) NEGATIVE Final    Comment: CRITICAL RESULT CALLED TO, READ  BACK BY AND VERIFIED WITH: BETH JONES AT 1937 08/30/20 MF    C Diff interpretation Toxin producing C. difficile detected.  Final    Comment: Performed at [facility], [address] [place], [place] [address]  Gastrointestinal Panel by PCR , Stool     Status: None   Collection Time: 08/31/20  1:10 AM   Specimen: Stool  Result Value Ref Range Status   Campylobacter species NOT DETECTED NOT DETECTED Final   Plesimonas shigelloides NOT DETECTED NOT DETECTED Final   Salmonella species NOT DETECTED NOT DETECTED Final   Yersinia enterocolitica NOT DETECTED NOT DETECTED Final   Vibrio species NOT DETECTED NOT DETECTED Final   Vibrio cholerae NOT DETECTED NOT DETECTED Final   Enteroaggregative E coli (EAEC) NOT DETECTED NOT DETECTED Final   Enteropathogenic E coli (EPEC) NOT DETECTED NOT DETECTED Final   Enterotoxigenic E coli (ETEC) NOT DETECTED NOT DETECTED Final   Shiga like toxin producing E coli (STEC) NOT DETECTED NOT DETECTED Final   Shigella/Enteroinvasive E coli (EIEC) NOT DETECTED NOT DETECTED Final   Cryptosporidium NOT DETECTED NOT DETECTED Final   Cyclospora cayetanensis NOT DETECTED NOT DETECTED Final   Entamoeba histolytica NOT DETECTED NOT DETECTED Final   Giardia lamblia NOT DETECTED NOT DETECTED Final   Adenovirus F40/41 NOT DETECTED NOT DETECTED Final   Astrovirus NOT DETECTED NOT DETECTED Final   Norovirus GI/GII NOT DETECTED NOT DETECTED Final   Rotavirus A NOT DETECTED NOT DETECTED Final   Sapovirus (I, II, IV, and V) NOT DETECTED NOT DETECTED Final    Comment: Performed at [facility], [address] [place], [place] [address]  MRSA PCR Screening     Status: None   Collection Time: 08/31/20  1:20 AM  Result Value Ref Range Status   MRSA by PCR NEGATIVE NEGATIVE Final    Comment:        The GeneXpert MRSA Assay (FDA approved for NASAL specimens only), is one component of a comprehensive MRSA colonization surveillance program. It is  not intended to diagnose MRSA infection nor to guide or monitor treatment for MRSA infections. Performed at [facility], [address] [place], [place] [address]   Urine culture     Status: None   Collection Time: 08/31/20  4:28 PM   Specimen: In/Out Cath Urine  Result Value Ref Range Status   Specimen Description   Final    IN/OUT CATH URINE Performed at [facility], [address], [place], [place] [address]    Special Requests   Final    NONE Performed at [facility], [address] [place], [place] [address]    Culture   Final    NO GROWTH Performed at [facility], [place] [address], [place], [place] [address]    Report Status 09/02/2020 FINAL  Final     Labs: Basic Metabolic Panel: Recent Labs  Lab 09/02/20 0439 09/03/20 0400 09/04/20 0553  NA 133* 136 137  K 4.3 4.0 4.5  CL 107 110 110  CO2 21* 23 23  GLUCOSE 95 109* 95  BUN 45* 42* 33*  CREATININE 1.31* 1.11* 0.97  CALCIUM 7.6* 7.5* 7.6*  MG 2.2  --   --  PHOS 3.1  --   --    Liver Function Tests: No results for input(s): AST, ALT, ALKPHOS, BILITOT, PROT, ALBUMIN in the last 168 hours. No results for input(s): LIPASE, AMYLASE in the last 168 hours. No results for input(s): AMMONIA in the last 168 hours. CBC: Recent Labs  Lab 09/02/20 0439 09/03/20 0400 09/04/20 0553 09/06/20 0516  WBC 26.9* 13.3* 13.2* 9.4  HGB 10.2* 10.6* 11.1* 10.4*  HCT 31.1* 32.1* 33.5* 31.4*  MCV 100.6* 100.3* 100.6* 100.0  PLT 260 290 328 288   Cardiac Enzymes: No results for input(s): CKTOTAL, CKMB, CKMBINDEX, TROPONINI in the last 168 hours. BNP: BNP (last 3 results) No results for input(s): BNP in the last 8760 hours.  ProBNP (last 3 results) No results for input(s): PROBNP in the last 8760 hours.  CBG: Recent Labs  Lab 09/02/20 1545 09/02/20 2102 09/03/20 0035 09/03/20 0541 09/03/20 0754  GLUCAP 117* 101* 105* 101* 80       Signed:  Domenic Polite MD.   [facility] 09/08/2020, 10:12 AM

## 2020-09-09 MED ORDER — LOPERAMIDE HCL 2 MG PO CAPS: 2.0000 mg | ORAL_CAPSULE | Freq: Two times a day (BID) | ORAL | Status: DC | PRN

## 2020-09-09 MED ORDER — HYDRALAZINE HCL 50 MG PO TABS
25.0000 mg | ORAL_TABLET | Freq: Two times a day (BID) | ORAL | Status: DC
  Administered 2020-09-09 – 2020-09-12 (×7): 25 mg via ORAL
  Filled 2020-09-09 (×7): qty 1

## 2020-09-09 NOTE — Progress Notes (Signed)
PROGRESS NOTE  [name] [name]  DOB: 1955-10-14  PCP: Barbaraann Boys, MD XNA:[id number]  DOA: 08/30/2020  LOS: 9 days  Hospital Day: 11   Chief Complaint  Patient presents with  . Fever  . Diarrhea   Brief narrative: [name] [name] is a 65 y.o. female with PMH significant for paroxysmal A. fib on flecainide and Eliquis, thoracic aortic aneurysm, obesity, endometriosis, hypothyroidism, history of PPM for tachybradycardia syndrome who recently fell at home and injured her right knee after which she was admitted at [facility] for ensuing cellulitis, received IV clindamycin, IV vancomycin and was discharged home on Keflex.  Few days later, she started having profuse foul-smelling diarrhea and hence she presented to the ED on 5/8 with abdominal pain, fever and diarrhea. In the ED she was hypotensive. CT abdomen showed extensive pancolitis with no associated bowel perforation or obstruction.   She was admitted to ICU for septic shock secondary to severe C. difficile colitis. Subsequently transferred out to floor. Patient was treated with a course of oral vancomycin. See below for details  Subjective: Patient was seen and examined this morning. Middle-aged Caucasian female. Lying down in bed.  Her biggest complaint at this time is diarrhea which seems to be somewhat better today with slightly formed stool.  Assessment/Plan: Septic shock  Severe C. difficile colitis -Presented with profuse diarrhea, dehydration.  Admitted to ICU for septic shock, required fluid resuscitation and pressors.   -Clinically improved.  WBC count normalized. Currently on oral vancomycin for 2 weeks course.   -Diarrhea persists.  Start on Imodium as needed.  -Repeat labs tomorrow. Recent Labs  Lab 09/03/20 0400 09/04/20 0553 09/06/20 0516  WBC 13.3* 13.2* 9.4  PROCALCITON 0.55  --   --    Acute kidney injury -Resolved with IV hydration. Recent Labs    08/30/20 2212 08/31/20 0405 09/01/20 0152  09/02/20 0439 09/03/20 0400 09/04/20 0553  BUN 42* 43* 44* 45* 42* 33*  CREATININE 2.15* 1.97* 1.54* 1.31* 1.11* 0.97   Essential hypertension -Hypotensive on admission, improved and stable now, hydralazine and Coreg resumed, Cozaar resumed at lower dose along with low dose lasix  Bilateral lower extremity edema -Patient has chronic bilateral lower extremity edema with stasis changes.  Earlier in the admission, Lasix was held because of septic shock.  It is now been resumed.  Continue to monitor clinically.  Compression stockings ordered.  Hypothyroidism -Continue Synthroid  Paroxysmal atrial fibrillation -Continue flecainide, reduced dose of Coreg.  Anticoagulation with Eliquis  Morbid obesity  -Body mass index is 41.28 kg/m. Patient has been advised to make an attempt to improve diet and exercise patterns to aid in weight loss.  Impaired mobility -Mobility has been impaired since fall and injuring her right knee.  PT OT eval obtained.  SNF recommended which I agree with.  Right knee cellulitis -Currently has black dried out eschar on the top.  No evidence of active infection.  Mobility: PT/OT, SNF Code Status:   Code Status: Full Code  Nutritional status: Body mass index is 41.28 kg/m.     Diet Order            Diet regular Room service appropriate? Yes; Fluid consistency: Thin  Diet effective now                 DVT prophylaxis: SCDs Start: 08/31/20 0019 apixaban (ELIQUIS) tablet 5 mg   Antimicrobials:  Oral vancomycin Fluid: Not on IV fluid Consultants: None Family Communication:  None at bedside  Status is: Inpatient  Remains inpatient appropriate because: Ongoing diarrhea.  Pending SNF availability  Dispo: The patient is from: Home              Anticipated d/c is to: SNF when available              Patient currently is not medically stable to d/c.   Difficult to place patient No     Infusions:  . sodium chloride 999 mL/hr at 08/31/20 1021  .  promethazine (PHENERGAN) injection (IM or IVPB)      Scheduled Meds: . allopurinol  100 mg Oral BID  . apixaban  5 mg Oral BID  . carvedilol  12.5 mg Oral BID WC  . famotidine  20 mg Oral QHS  . flecainide  100 mg Oral BID  . furosemide  20 mg Oral Daily  . hydrALAZINE  25 mg Oral BID  . levothyroxine  175 mcg Oral Q0600  . losartan  50 mg Oral Daily  . nystatin   Topical BID  . vancomycin  125 mg Oral Q6H    Antimicrobials: Anti-infectives (From admission, onward)   Start     Dose/Rate Route Frequency Ordered Stop   09/08/20 1300  vancomycin (VANCOCIN) 50 mg/mL oral solution 125 mg        125 mg Oral Every 6 hours 09/08/20 1014 09/14/20 0659   09/08/20 0000  vancomycin (VANCOCIN) 50 mg/mL oral solution        125 mg Oral Every 6 hours 09/08/20 1014 09/14/20 2359   09/07/20 0000  vancomycin (VANCOCIN) 50 mg/mL oral solution  Status:  Discontinued        125 mg Oral Every 6 hours 09/07/20 1408 09/08/20    08/31/20 1100  ceFEPIme (MAXIPIME) 2 g in sodium chloride 0.9 % 100 mL IVPB  Status:  Discontinued        2 g 200 mL/hr over 30 Minutes Intravenous Every 12 hours 08/31/20 0214 08/31/20 0238   08/31/20 0700  metroNIDAZOLE (FLAGYL) IVPB 500 mg  Status:  Discontinued        500 mg 100 mL/hr over 60 Minutes Intravenous Every 8 hours 08/31/20 0155 09/02/20 0947   08/31/20 0000  vancomycin (VANCOCIN) 50 mg/mL oral solution 500 mg  Status:  Discontinued        500 mg Oral Every 6 hours 08/30/20 2325 09/08/20 1014   08/30/20 2215  ceFEPIme (MAXIPIME) 2 g in sodium chloride 0.9 % 100 mL IVPB        2 g 200 mL/hr over 30 Minutes Intravenous  Once 08/30/20 2209 08/31/20 2043   08/30/20 2215  metroNIDAZOLE (FLAGYL) IVPB 500 mg        500 mg 100 mL/hr over 60 Minutes Intravenous  Once 08/30/20 2209 08/30/20 2341      PRN meds: acetaminophen, liver oil-zinc oxide, loperamide, ondansetron (ZOFRAN) IV, oxyCODONE-acetaminophen, phenylephrine-shark liver oil-mineral oil-petrolatum,  promethazine (PHENERGAN) injection (IM or IVPB)   Objective: Vitals:   09/09/20 0759 09/09/20 1156  BP: (!) 168/86 (!) 156/92  Pulse: 61 62  Resp: 16 18  Temp: 98.4 F (36.9 C) 98.4 F (36.9 C)  SpO2: 99% 97%    Intake/Output Summary (Last 24 hours) at 09/09/2020 1255 Last data filed at 09/08/2020 1945 Gross per 24 hour  Intake 300 ml  Output --  Net 300 ml   Filed Weights   09/07/20 0500 09/08/20 0624 09/09/20 0500  Weight: 119.3 kg 123.8 kg 123.2 kg   Weight change: -0.648  kg Body mass index is 41.28 kg/m.   Physical Exam: General exam: Pleasant, middle-aged Caucasian female.  Morbid obese Skin: No rashes, lesions or ulcers. HEENT: Atraumatic, normocephalic, no obvious bleeding Lungs: Clear to auscultation bilaterally CVS: Regular rate and rhythm, no murmur GI/Abd soft, nontender, nondistended, bowel sound present CNS: Alert, awake, oriented times 3 Psychiatry: Mood appropriate Extremities: Pedal edema 1+ bilaterally with stasis changes  Data Review: I have personally reviewed the laboratory data and studies available.  Recent Labs  Lab 09/03/20 0400 09/04/20 0553 09/06/20 0516  WBC 13.3* 13.2* 9.4  HGB 10.6* 11.1* 10.4*  HCT 32.1* 33.5* 31.4*  MCV 100.3* 100.6* 100.0  PLT 290 328 288   Recent Labs  Lab 09/03/20 0400 09/04/20 0553  NA 136 137  K 4.0 4.5  CL 110 110  CO2 23 23  GLUCOSE 109* 95  BUN 42* 33*  CREATININE 1.11* 0.97  CALCIUM 7.5* 7.6*    F/u labs ordered. Unresulted Labs (From admission, onward)          Start     Ordered   09/10/20 0500  CBC with Differential/Platelet  Tomorrow morning,   R       Question:  Specimen collection method  Answer:  Lab=Lab collect   09/09/20 1248   09/10/20 0500  Comprehensive metabolic panel  Tomorrow morning,   R       Question:  Specimen collection method  Answer:  Lab=Lab collect   09/09/20 1248   09/10/20 0500  Magnesium  Tomorrow morning,   R       Question:  Specimen collection method   Answer:  Lab=Lab collect   09/09/20 1248   09/10/20 0500  Phosphorus  Tomorrow morning,   R       Question:  Specimen collection method  Answer:  Lab=Lab collect   09/09/20 1248          Signed, Terrilee Croak, MD [facility] 09/09/2020

## 2020-09-09 NOTE — Plan of Care (Signed)
End of Shift Summary:    Alert and oriented x4. VSS. Afebrile. Remained on room air. Type 5 stools overnight. Ambulated with pt with walker. Denies pain or  n/v. Remained free from falls or injury. Refusing bed alarm, educated and encouraged to call for assistance. Call bell within reach.    Problem: Education: Goal: Knowledge of General Education information will improve Description: Including pain rating scale, medication(s)/side effects and non-pharmacologic comfort measures Outcome: Progressing   Problem: Health Behavior/Discharge Planning: Goal: Ability to manage health-related needs will improve Outcome: Progressing   Problem: Clinical Measurements: Goal: Diagnostic test results will improve Outcome: Progressing   Problem: Clinical Measurements: Goal: Will remain free from infection Outcome: Progressing   Problem: Clinical Measurements: Goal: Ability to maintain clinical measurements within normal limits will improve Outcome: Progressing   Problem: Activity: Goal: Risk for activity intolerance will decrease Outcome: Progressing   Problem: Nutrition: Goal: Adequate nutrition will be maintained Outcome: Progressing   Problem: Coping: Goal: Level of anxiety will decrease Outcome: Progressing   Problem: Elimination: Goal: Will not experience complications related to bowel motility Outcome: Progressing   Problem: Safety: Goal: Ability to remain free from injury will improve Outcome: Progressing

## 2020-09-09 NOTE — TOC Progression Note (Signed)
Transition of Care [facility]) - Progression Note    Patient Details  Name: [name] [name] MRN: [id number] Date of Birth: 01-Mar-1956  Transition of Care [facility]) CM/SW Contact  Shelbie Hutching, RN Phone Number: 09/09/2020, 4:01 PM  Clinical Narrative:    RNCM informed that for workers comp to approve SNF for short term rehab they needed an order for SNF.  Order securely sent by email to worker's compensation case worker.        Expected Discharge Plan and Services                                                 Social Determinants of Health (SDOH) Interventions    Readmission Risk Interventions No flowsheet data found.

## 2020-09-09 NOTE — TOC Transition Note (Signed)
Transition of Care [facility]) - CM/SW Discharge Note   Patient Details  Name: [name] [name] MRN: [id number] Date of Birth: 14-Sep-1955  Transition of Care [facility]) CM/SW Contact:  Pete Pelt, RN Phone Number: 09/09/2020, 8:59 AM   Clinical Narrative:   Patient's caseworker Eddie called TOC.  Stay is eligible for Worker's Compensation.  Currently, patient has a bed at [facility] for SNF recommendation, communicated to [employer].  Workers Engineer, building services was going to call SNF for coverage, however patient was told by PT that she is able to go home.    Patient is also concerned about going home with diarrhea, as she continues to suffer from this.  Care team notified for final disposition and then [facility] will communicate to worker's comp.          Patient Goals and CMS Choice        Discharge Placement                       Discharge Plan and Services                                     Social Determinants of Health (SDOH) Interventions     Readmission Risk Interventions No flowsheet data found.

## 2020-09-10 LAB — COMPREHENSIVE METABOLIC PANEL
ALT: 16 U/L (ref 0–44)
AST: 29 U/L (ref 15–41)
Albumin: 2.2 g/dL — ABNORMAL LOW (ref 3.5–5.0)
Alkaline Phosphatase: 61 U/L (ref 38–126)
Anion gap: 5 (ref 5–15)
BUN: 17 mg/dL (ref 8–23)
CO2: 26 mmol/L (ref 22–32)
Calcium: 8 mg/dL — ABNORMAL LOW (ref 8.9–10.3)
Chloride: 109 mmol/L (ref 98–111)
Creatinine, Ser: 0.69 mg/dL (ref 0.44–1.00)
GFR, Estimated: >60 mL/min (ref >60)
Glucose, Bld: 91 mg/dL (ref 70–99)
Potassium: 3.9 mmol/L (ref 3.5–5.1)
Sodium: 140 mmol/L (ref 135–145)
Total Bilirubin: 0.4 mg/dL (ref 0.3–1.2)
Total Protein: 4.3 g/dL — ABNORMAL LOW (ref 6.5–8.1)

## 2020-09-10 LAB — CBC WITH DIFFERENTIAL/PLATELET
Abs Immature Granulocytes: 0.24 10*3/uL — ABNORMAL HIGH (ref 0.00–0.07)
Basophils Absolute: 0 10*3/uL (ref 0.0–0.1)
Basophils Relative: 1 %
Eosinophils Absolute: 0.1 10*3/uL (ref 0.0–0.5)
Eosinophils Relative: 2 %
HCT: 29.3 % — ABNORMAL LOW (ref 36.0–46.0)
Hemoglobin: 9.7 g/dL — ABNORMAL LOW (ref 12.0–15.0)
Immature Granulocytes: 4 %
Lymphocytes Relative: 15 %
Lymphs Abs: 0.9 10*3/uL (ref 0.7–4.0)
MCH: 33 pg (ref 26.0–34.0)
MCHC: 33.1 g/dL (ref 30.0–36.0)
MCV: 99.7 fL (ref 80.0–100.0)
Monocytes Absolute: 0.5 10*3/uL (ref 0.1–1.0)
Monocytes Relative: 9 %
Neutro Abs: 4.2 10*3/uL (ref 1.7–7.7)
Neutrophils Relative %: 69 %
Platelets: 271 10*3/uL (ref 150–400)
RBC: 2.94 MIL/uL — ABNORMAL LOW (ref 3.87–5.11)
RDW: 15.2 % (ref 11.5–15.5)
WBC: 6.1 10*3/uL (ref 4.0–10.5)
nRBC: 0 % (ref 0.0–0.2)

## 2020-09-10 LAB — MAGNESIUM: Magnesium: 1.7 mg/dL (ref 1.7–2.4)

## 2020-09-10 LAB — PHOSPHORUS: Phosphorus: 3 mg/dL (ref 2.5–4.6)

## 2020-09-10 NOTE — Progress Notes (Signed)
Physical Therapy Treatment Patient Details Name: [name] [name] MRN: [id number] DOB: Aug 06, 1955 Today's Date: 09/10/2020    History of Present Illness Per MD note: 65 year old female with history of paroxysmal A. fib on flecainide and Eliquis, thoracic aortic aneurysm, obesity, endometriosis, hypothyroidism, history of PPM for tachybradycardia syndrome presented to the ED with abdominal pain fevers and profuse diarrhea, recently discharged from [facility] after treatment for cellulitis, she received clindamycin and IV vancomycin and was discharged home on Keflex.  Few days after discharge started having profuse foul-smelling diarrhea.    PT Comments    Discussed POC with patient this AM. She feels she is making progress towards home discharge, however was discouraged with set back yesterday. Felt better last night and was slightly anxious to perform mobility this date. Did very well with endurance/mobility/balance. Continue to recommend rollator for home use. Discussed SNF vs HHPT. Discussed plan to update dispo to reflect improved progress towards goals, pt agreeable. Will continue to progress as able.   Follow Up Recommendations  Home health PT     Equipment Recommendations   (rollator)    Recommendations for Other Services       Precautions / Restrictions Precautions Precautions: Fall Restrictions Weight Bearing Restrictions: No    Mobility  Bed Mobility Overal bed mobility: Modified Independent Bed Mobility: Supine to Sit;Sit to Supine     Supine to sit: Modified independent (Device/Increase time)     General bed mobility comments: safe technique with ease of transfer. Once seated, upright posture noted    Transfers Overall transfer level: Needs assistance Equipment used: None Transfers: Sit to/from Stand Sit to Stand: Supervision         General transfer comment: able to stand without AD. Upright posture. Able to stand from low  surface  Ambulation/Gait Ambulation/Gait assistance: Supervision Gait Distance (Feet): 200 Feet Assistive device: Rolling walker (2 wheeled) Gait Pattern/deviations: Step-through pattern     General Gait Details: slow gait speed with 1 standing rest break. Pt reports she has been able to ambulate RN station every evening shift to build up endurance. Pt demonstrates safe use of RW, adjusted to patient height. Reciprocal gait pattern performed. Further distance performed in room without AD, however unsafe with pt reaching for furniture with B UE. Encouraged to use RW   Marine scientist Rankin (Stroke Patients Only)       Balance Overall balance assessment: Needs assistance Sitting-balance support: No upper extremity supported;Feet supported Sitting balance-Leahy Scale: Normal     Standing balance support: No upper extremity supported Standing balance-Leahy Scale: Good                              Cognition Arousal/Alertness: Awake/alert Behavior During Therapy: WFL for tasks assessed/performed Overall Cognitive Status: Within Functional Limits for tasks assessed                                        Exercises      General Comments        Pertinent Vitals/Pain Pain Assessment: No/denies pain    Home Living                      Prior Function  PT Goals (current goals can now be found in the care plan section) Acute Rehab PT Goals Patient Stated Goal: to get stronger PT Goal Formulation: With patient Time For Goal Achievement: 09/17/20 Potential to Achieve Goals: Good Progress towards PT goals: Progressing toward goals    Frequency    Min 2X/week      PT Plan Discharge plan needs to be updated    Co-evaluation              AM-PAC PT "6 Clicks" Mobility   Outcome Measure  Help needed turning from your back to your side while in a flat bed without using  bedrails?: None Help needed moving from lying on your back to sitting on the side of a flat bed without using bedrails?: None Help needed moving to and from a bed to a chair (including a wheelchair)?: None Help needed standing up from a chair using your arms (e.g., wheelchair or bedside chair)?: A Little Help needed to walk in hospital room?: A Little Help needed climbing 3-5 steps with a railing? : A Little 6 Click Score: 21    End of Session   Activity Tolerance: Patient tolerated treatment well Patient left: in chair Nurse Communication: Mobility status PT Visit Diagnosis: Unsteadiness on feet (R26.81);Other abnormalities of gait and mobility (R26.89);Muscle weakness (generalized) (M62.81);History of falling (Z91.81);Difficulty in walking, not elsewhere classified (R26.2);Repeated falls (R29.6)     Time: 8119-1478 PT Time Calculation (min) (ACUTE ONLY): 26 min  Charges:  $Gait Training: 23-37 mins                     Greggory Stallion, [place], DPT [phone]    [name] [name] 09/10/2020, 1:31 PM

## 2020-09-10 NOTE — TOC Progression Note (Signed)
Transition of Care [facility]) - Progression Note    Patient Details  Name: [name] [name] MRN: [id number] Date of Birth: [birth year]  Transition of Care [facility]) CM/SW [place], RN Phone Number: 09/10/2020, 1:45 PM  Clinical Narrative:   TOC spoke to [place], from [employer].  Order for SNF was sent.  Informed worker's comp that patient has a bed [facility] (prior to worker's comp approving stay, was on her private insurance), however [facility] will not accept worker's comp.  Debbie, other worker's comp representative was scheduled to call [facility], but has not reported this to date.  TOC awaiting worker's comp authorization for SNF in order to reach out to facilities that accept worker's comp for placement.  Per care team, patient is not medically ready for discharge today.         Expected Discharge Plan and Services                                                 Social Determinants of Health (SDOH) Interventions    Readmission Risk Interventions No flowsheet data found.

## 2020-09-10 NOTE — Progress Notes (Signed)
Occupational Therapy Treatment Patient Details Name: [name] [name] MRN: [id number] DOB: Nov 19, 1955 Today's Date: 09/10/2020    History of present illness Per MD note: 65 year old female with history of paroxysmal A. fib on flecainide and Eliquis, thoracic aortic aneurysm, obesity, endometriosis, hypothyroidism, history of PPM for tachybradycardia syndrome presented to the ED with abdominal pain fevers and profuse diarrhea, recently discharged from [facility] after treatment for cellulitis, she received clindamycin and IV vancomycin and was discharged home on Keflex.  Few days after discharge started having profuse foul-smelling diarrhea.   OT comments  Upon entering the room, pt supine in bed with no c/o pain. Session focused on B UE strengthening HEP and discharge planning. OT educated and demonstrated use of red resistive theraband for strengthening exercises. Pt performing 3 sets of 10 chest pulls, shoulder elevation, shoulder diagonals, and alternating punches with min cuing for technique and rest breaks as needed. OT discussed home set up and pt's progress with focus now on going home. Pt has all needed equipment except for recommended rollator for safety. OT discussed Ranger recommendation and how that would occur/expectations after hospital discharge. Pt reports some nervousness but agreeable to home. Pt remained in bed with all needed items within reach upon exiting the room.    Follow Up Recommendations  Home health OT    Equipment Recommendations  Other (comment) (rollator)       Precautions / Restrictions Precautions Precautions: Fall Restrictions Weight Bearing Restrictions: No       Mobility Bed Mobility Overal bed mobility: Modified Independent Bed Mobility: Supine to Sit;Sit to Supine     Supine to sit: Modified independent (Device/Increase time) Sit to supine: Modified independent (Device/Increase time)   General bed mobility comments: safe technique with  ease of transfer. Once seated, upright posture noted    Transfers Overall transfer level: Needs assistance Equipment used: None Transfers: Sit to/from Stand Sit to Stand: Supervision         General transfer comment: able to stand without AD. Upright posture. Able to stand from low surface    Balance Overall balance assessment: Needs assistance Sitting-balance support: No upper extremity supported;Feet supported Sitting balance-Leahy Scale: Normal Sitting balance - Comments: steady sitting reaching outside BOS   Standing balance support: No upper extremity supported Standing balance-Leahy Scale: Good Standing balance comment: steady standing washing hands at sink                           ADL either performed or assessed with clinical judgement        Vision Patient Visual Report: No change from baseline            Cognition Arousal/Alertness: Awake/alert Behavior During Therapy: WFL for tasks assessed/performed Overall Cognitive Status: Within Functional Limits for tasks assessed                                                     Pertinent Vitals/ Pain       Pain Assessment: No/denies pain         Frequency  Min 2X/week        Progress Toward Goals  OT Goals(current goals can now be found in the care plan section)  Progress towards OT goals: Progressing toward goals  Acute Rehab OT Goals Patient Stated Goal: to  get stronger OT Goal Formulation: With patient Time For Goal Achievement: 09/18/20 Potential to Achieve Goals: Good  Plan Frequency remains appropriate;Discharge plan needs to be updated       AM-PAC OT "6 Clicks" Daily Activity     Outcome Measure   Help from another person eating meals?: None Help from another person taking care of personal grooming?: None Help from another person toileting, which includes using toliet, bedpan, or urinal?: None Help from another person bathing (including washing,  rinsing, drying)?: None Help from another person to put on and taking off regular upper body clothing?: None Help from another person to put on and taking off regular lower body clothing?: None 6 Click Score: 24    End of Session    OT Visit Diagnosis: Unsteadiness on feet (R26.81);History of falling (Z91.81);Repeated falls (R29.6);Muscle weakness (generalized) (M62.81)   Activity Tolerance Patient tolerated treatment well   Patient Left in bed;with call bell/phone within reach;with bed alarm set   Nurse Communication Mobility status        Time: 6286-3817 OT Time Calculation (min): 40 min  Charges: OT General Charges $OT Visit: 1 Visit OT Treatments $Self Care/Home Management : 8-22 mins $Therapeutic Exercise: 23-37 mins  Darleen Crocker, MS, OTR/L , CBIS ascom [phone]  09/10/20, 3:52 PM

## 2020-09-10 NOTE — Plan of Care (Addendum)
End of Shift Summary:    Alert and oriented x4. VSS. Afebrile. Remained on room air. 1 loose stool, 1 soft stool overnight. Ambulated with pt with walker. Denies pain or n/v. Small abrasion noted to left posterior thigh, cleansed and covered with gauze. Refusing bed alarm, educated and encouraged to call for assistance. Call bell within reach.    Problem: Education: Goal: Knowledge of General Education information will improve Description: Including pain rating scale, medication(s)/side effects and non-pharmacologic comfort measures Outcome: Progressing   Problem: Health Behavior/Discharge Planning: Goal: Ability to manage health-related needs will improve Outcome: Progressing    Problem: Clinical Measurements: Goal: Cardiovascular complication will be avoided Outcome: Progressing   Problem: Nutrition: Goal: Adequate nutrition will be maintained Outcome: Progressing   Problem: Activity: Goal: Risk for activity intolerance will decrease Outcome: Progressing   Problem: Coping: Goal: Level of anxiety will decrease Outcome: Progressing   Problem: Elimination: Goal: Will not experience complications related to bowel motility Outcome: Progressing

## 2020-09-10 NOTE — Progress Notes (Signed)
PROGRESS NOTE  [name] [name]  DOB: 1955-12-26  PCP: Barbaraann Boys, MD IRJ:[id number]  DOA: 08/30/2020  LOS: 10 days  Hospital Day: 12   Chief Complaint  Patient presents with  . Fever  . Diarrhea   Brief narrative: [name] [name] is a 65 y.o. female with PMH significant for paroxysmal A. fib on flecainide and Eliquis, thoracic aortic aneurysm, obesity, endometriosis, hypothyroidism, history of PPM for tachybradycardia syndrome who recently fell at home and injured her right knee after which she was admitted at [facility] for ensuing cellulitis, received IV clindamycin, IV vancomycin and was discharged home on Keflex.  Few days later, she started having profuse foul-smelling diarrhea and hence she presented to the ED on 5/8 with abdominal pain, fever and diarrhea. In the ED she was hypotensive. CT abdomen showed extensive pancolitis with no associated bowel perforation or obstruction.   She was admitted to ICU for septic shock secondary to severe C. difficile colitis. Subsequently transferred out to floor. Patient was treated with a course of oral vancomycin. See below for details.  Subjective: Patient was seen and examined this morning. Middle-aged Caucasian female. Sitting up in chair.  Continues to feel weak.  She thinks her diarrhea is getting firm but had 3-4 episodes overnight.  Assessment/Plan: Septic shock  Severe C. difficile colitis -Presented with profuse diarrhea, dehydration.  Admitted to ICU for septic shock, required fluid resuscitation and pressors.   -Clinically improved.  WBC count normalized. Currently on oral vancomycin for 2 weeks course.   -Diarrhea gradually improving on Imodium. -WBC count improved.  Continue to monitor. Recent Labs  Lab 09/04/20 0553 09/06/20 0516 09/10/20 0544  WBC 13.2* 9.4 6.1   Acute kidney injury -Resolved with IV hydration. Recent Labs    08/30/20 2212 08/31/20 0405 09/01/20 0152 09/02/20 0439 09/03/20 0400  09/04/20 0553 09/10/20 0544  BUN 42* 43* 44* 45* 42* 33* 17  CREATININE 2.15* 1.97* 1.54* 1.31* 1.11* 0.97 0.69   Essential hypertension -Hypotensive on admission, improved and stable now, hydralazine and Coreg resumed, Cozaar resumed at lower dose along with low dose lasix  Bilateral lower extremity edema -Patient has chronic bilateral lower extremity edema with stasis changes.  Earlier in the admission, Lasix was held because of septic shock.  It is now been resumed.  Continue to monitor clinically.  Compression stockings ordered.  Hypothyroidism -Continue Synthroid  Paroxysmal atrial fibrillation -Continue flecainide, reduced dose of Coreg.  Anticoagulation with Eliquis  Morbid obesity  -Body mass index is 41.1 kg/m. Patient has been advised to make an attempt to improve diet and exercise patterns to aid in weight loss.  Impaired mobility -Mobility has been impaired since fall and injuring her right knee.  PT OT eval obtained.  SNF recommended.  Pending authorization.  Right knee cellulitis -Currently has black dried out eschar on the top.  No evidence of active infection.  Mobility: PT/OT, SNF Code Status:   Code Status: Full Code  Nutritional status: Body mass index is 41.1 kg/m.     Diet Order            Diet regular Room service appropriate? Yes; Fluid consistency: Thin  Diet effective now                 DVT prophylaxis: SCDs Start: 08/31/20 0019 apixaban (ELIQUIS) tablet 5 mg   Antimicrobials:  Oral vancomycin Fluid: Not on IV fluid Consultants: None Family Communication:  None at bedside  Status is: Inpatient  Remains inpatient appropriate because:  Ongoing diarrhea.  Pending SNF availability  Dispo: The patient is from: Home              Anticipated d/c is to: SNF when available              Patient currently is not medically stable to d/c.   Difficult to place patient No     Infusions:  . sodium chloride Stopped (09/10/20 0900)  .  promethazine (PHENERGAN) injection (IM or IVPB)      Scheduled Meds: . allopurinol  100 mg Oral BID  . apixaban  5 mg Oral BID  . carvedilol  12.5 mg Oral BID WC  . famotidine  20 mg Oral QHS  . flecainide  100 mg Oral BID  . furosemide  20 mg Oral Daily  . hydrALAZINE  25 mg Oral BID  . levothyroxine  175 mcg Oral Q0600  . losartan  50 mg Oral Daily  . nystatin   Topical BID  . vancomycin  125 mg Oral Q6H    Antimicrobials: Anti-infectives (From admission, onward)   Start     Dose/Rate Route Frequency Ordered Stop   09/08/20 1300  vancomycin (VANCOCIN) 50 mg/mL oral solution 125 mg        125 mg Oral Every 6 hours 09/08/20 1014 09/14/20 0659   09/08/20 0000  vancomycin (VANCOCIN) 50 mg/mL oral solution        125 mg Oral Every 6 hours 09/08/20 1014 09/14/20 2359   09/07/20 0000  vancomycin (VANCOCIN) 50 mg/mL oral solution  Status:  Discontinued        125 mg Oral Every 6 hours 09/07/20 1408 09/08/20    08/31/20 1100  ceFEPIme (MAXIPIME) 2 g in sodium chloride 0.9 % 100 mL IVPB  Status:  Discontinued        2 g 200 mL/hr over 30 Minutes Intravenous Every 12 hours 08/31/20 0214 08/31/20 0238   08/31/20 0700  metroNIDAZOLE (FLAGYL) IVPB 500 mg  Status:  Discontinued        500 mg 100 mL/hr over 60 Minutes Intravenous Every 8 hours 08/31/20 0155 09/02/20 0947   08/31/20 0000  vancomycin (VANCOCIN) 50 mg/mL oral solution 500 mg  Status:  Discontinued        500 mg Oral Every 6 hours 08/30/20 2325 09/08/20 1014   08/30/20 2215  ceFEPIme (MAXIPIME) 2 g in sodium chloride 0.9 % 100 mL IVPB        2 g 200 mL/hr over 30 Minutes Intravenous  Once 08/30/20 2209 08/31/20 2043   08/30/20 2215  metroNIDAZOLE (FLAGYL) IVPB 500 mg        500 mg 100 mL/hr over 60 Minutes Intravenous  Once 08/30/20 2209 08/30/20 2341      PRN meds: acetaminophen, liver oil-zinc oxide, loperamide, ondansetron (ZOFRAN) IV, oxyCODONE-acetaminophen, phenylephrine-shark liver oil-mineral oil-petrolatum,  promethazine (PHENERGAN) injection (IM or IVPB)   Objective: Vitals:   09/10/20 0836 09/10/20 1216  BP: (!) 168/79 137/72  Pulse: 63 63  Resp: 18 18  Temp: 98.2 F (36.8 C)   SpO2: 96% 98%   No intake or output data in the 24 hours ending 09/10/20 1458 Filed Weights   09/08/20 0624 09/09/20 0500 09/10/20 0500  Weight: 123.8 kg 123.2 kg 122.6 kg   Weight change: -0.552 kg Body mass index is 41.1 kg/m.   Physical Exam: General exam: Pleasant, middle-aged Caucasian female.  Morbid obese Skin: No rashes, lesions or ulcers. HEENT: Atraumatic, normocephalic, no obvious bleeding Lungs: Clear  to auscultation bilaterally CVS: Regular rate and rhythm, no murmur GI/Abd soft, nontender, nondistended, bowel sound present CNS: Alert, awake, oriented times 3 Psychiatry: Depressed Extremities: Pedal edema 1+ bilaterally with stasis changes  Data Review: I have personally reviewed the laboratory data and studies available.  Recent Labs  Lab 09/04/20 0553 09/06/20 0516 09/10/20 0544  WBC 13.2* 9.4 6.1  NEUTROABS  --   --  4.2  HGB 11.1* 10.4* 9.7*  HCT 33.5* 31.4* 29.3*  MCV 100.6* 100.0 99.7  PLT 328 288 271   Recent Labs  Lab 09/04/20 0553 09/10/20 0544  NA 137 140  K 4.5 3.9  CL 110 109  CO2 23 26  GLUCOSE 95 91  BUN 33* 17  CREATININE 0.97 0.69  CALCIUM 7.6* 8.0*  MG  --  1.7  PHOS  --  3.0    F/u labs ordered. Unresulted Labs (From admission, onward)          Start     Ordered   09/09/20 0000  Other/Misc lab test  R       Question:  Test name / description:  Answer:  [facility] to SNF   09/09/20 1429          Signed, Terrilee Croak, MD [facility] 09/10/2020

## 2020-09-11 MED ORDER — FUROSEMIDE 20 MG PO TABS: 20.0000 mg | ORAL_TABLET | Freq: Every day | ORAL | 0 refills | Status: DC

## 2020-09-11 MED ORDER — LOPERAMIDE HCL 2 MG PO CAPS: 2.0000 mg | ORAL_CAPSULE | Freq: Two times a day (BID) | ORAL | 0 refills | Status: AC | PRN

## 2020-09-11 MED ORDER — LOSARTAN POTASSIUM 50 MG PO TABS
100.0000 mg | ORAL_TABLET | Freq: Every day | ORAL | Status: DC
  Administered 2020-09-11 – 2020-09-12 (×2): 100 mg via ORAL
  Filled 2020-09-11 (×2): qty 2

## 2020-09-11 MED ORDER — LOSARTAN POTASSIUM 100 MG PO TABS: 100.0000 mg | ORAL_TABLET | Freq: Every day | ORAL | 2 refills | Status: AC

## 2020-09-11 MED ORDER — CARVEDILOL 12.5 MG PO TABS: 12.5000 mg | ORAL_TABLET | Freq: Two times a day (BID) | ORAL | 2 refills | Status: AC

## 2020-09-11 MED ORDER — HYDRALAZINE HCL 25 MG PO TABS: 25.0000 mg | ORAL_TABLET | Freq: Two times a day (BID) | ORAL | 2 refills | Status: DC

## 2020-09-11 MED ORDER — VANCOMYCIN 50 MG/ML ORAL SOLUTION: 125.0000 mg | Freq: Four times a day (QID) | ORAL | 0 refills | Status: AC

## 2020-09-11 NOTE — TOC Progression Note (Signed)
Transition of Care [facility]) - Progression Note    Patient Details  Name: [name] [name] MRN: [id number] Date of Birth: [birth year]  Transition of Care [facility]) CM/SW [place], RN Phone Number: 09/11/2020, 4:37 PM  Clinical Narrative:   TOC spoke with worker's compensation adjuster Debbie today.  Sent order for Home Health via secure email per adjuster request.  Adjuster searched and has not found a Home health agency to accept worker's compensation and verbalized that she has the same difficulty with patient's previous discharge.  At this time, there is no provider to accept worker's compensation and provide home health needs for patient.  Care team aware that patient may have to discharge without Home Health services.   TOC contacted patient in her room, who stated that worker's compensation has been working to get her home health for 2 months without success.  Patient discharge cancelled for today per MD.               Expected Discharge Plan and Services           Expected Discharge Date: 09/11/20                                     Social Determinants of Health (SDOH) Interventions    Readmission Risk Interventions No flowsheet data found.

## 2020-09-11 NOTE — Discharge Summary (Signed)
Physician Discharge Summary  DIETRA STOKELY [name] [name]:[id number] DOB: [birth year] DOA: 08/30/2020  PCP: Barbaraann Boys, MD  Admit date: 08/30/2020 Discharge date: 09/11/2020  Admitted From: Home Discharge disposition: Home with home health PT, OT, RN, social worker   Code Status: Full Code  Diet Recommendation: Cardiac diet  Discharge Diagnosis:   Active Problems:   Septic shock [facility])  Chief Complaint  Patient presents with  . Fever  . Diarrhea   Brief narrative: [name] [name] is a 65 y.o. female with PMH significant for paroxysmal A. fib on flecainide and Eliquis, thoracic aortic aneurysm, obesity, endometriosis, hypothyroidism, history of PPM for tachybradycardia syndrome who recently fell at home and injured her right knee after which she was admitted at [facility] for ensuing cellulitis, received IV clindamycin, IV vancomycin and was discharged home on Keflex.  Few days later, she started having profuse foul-smelling diarrhea and hence she presented to the ED on 5/8 with abdominal pain, fever and diarrhea. In the ED she was hypotensive. CT abdomen showed extensive pancolitis with no associated bowel perforation or obstruction.   She was admitted to ICU for septic shock secondary to severe C. difficile colitis. Subsequently transferred out to floor. Patient was treated with a course of oral vancomycin. See below for details.  Subjective: Patient was seen and examined this morning. Sitting up in bed.  Not in distress.  Feels better.  Diarrhea improving. Feels good enough to go home with home health services  Hospital course: Septic shock  Severe C. difficile colitis -Presented with profuse diarrhea, dehydration.  Admitted to ICU for septic shock, required fluid resuscitation and pressors.   -Clinically improved.  WBC count normalized. Currently on oral vancomycin for 2 weeks course.   -Diarrhea gradually improving on Imodium. -WBC count improved as well.  Okay to complete the  course of oral vancomycin at home.  Okay to continue as needed Imodium. Recent Labs  Lab 09/06/20 0516 09/10/20 0544  WBC 9.4 6.1   Acute kidney injury -Resolved with IV hydration. Recent Labs    08/30/20 2212 08/31/20 0405 09/01/20 0152 09/02/20 0439 09/03/20 0400 09/04/20 0553 09/10/20 0544  BUN 42* 43* 44* 45* 42* 33* 17  CREATININE 2.15* 1.97* 1.54* 1.31* 1.11* 0.97 0.69   Essential hypertension -Hypotensive on admission, improved and stable now.  -Continue current blood pressure regimen at home.  Bilateral lower extremity edema -Patient has chronic bilateral lower extremity edema with stasis changes.  Earlier in the admission, Lasix was held because of septic shock.  It is now been resumed. Compression stockings on as well.  Right knee cellulitis -Currently has black dried out eschar on the top.  No evidence of active infection.  Hypothyroidism -Continue Synthroid  Paroxysmal atrial fibrillation -Continue flecainide, reduced dose of Coreg.  Continue anticoagulation with Eliquis  Morbid obesity  -Body mass index is 40.63 kg/m. Patient has been advised to make an attempt to improve diet and exercise patterns to aid in weight loss.  Impaired mobility -Mobility has been impaired since fall and injuring her right knee.  PT OT eval obtained.  SNF recommended.  Patient awaiting for SNF stability.  In the meantime, her physical functionality improved and she feels comfortable going home today.  Stable to discharge home today with home health, PT, OT, RN, social worker   Wound care:    Discharge Exam:   Vitals:   09/11/20 0427 09/11/20 0446 09/11/20 0629 09/11/20 1048  BP: (!) 172/77  (!) 169/80 (!) 153/79  Pulse: Marland Kitchen)  59  60 63  Resp: 18   16  Temp: 98 F (36.7 C)   97.9 F (36.6 C)  TempSrc:      SpO2: 98%   98%  Weight:  121.2 kg    Height:        Body mass index is 40.63 kg/m.  General exam: Pleasant obese, middle-aged Caucasian female. Skin: No  rashes, lesions or ulcers. HEENT: Atraumatic, normocephalic, no obvious bleeding Lungs: Clear to auscultation bilaterally CVS: Regular rate and rhythm, no murmur GI/Abd soft, nontender, nondistended, bowel sound present CNS: Alert, awake, oriented x3 Psychiatry: Mood appropriate Extremities: Compression stockings in both extremities.  Right knee with decreasing size of hematoma and are drying out eschar.  No evidence of infection.  Follow ups:   Discharge Instructions    Diet - low sodium heart healthy   Complete by: As directed    Increase activity slowly   Complete by: As directed       Contact information for follow-up providers    Orene DesanctisBehling, Karen, MD Follow up.   Specialty: Pediatrics Contact information: [address] [place] [id number] [phone]            Contact information for after-discharge care    Destination    [facility] Preferred SNF .   Service: Skilled Nursing Contact information: [address] [place] [id number] [phone]                  Recommendations for Outpatient Follow-Up:   1. Follow-up with PCP as an outpatient  Discharge Instructions:  Follow with Primary MD Orene DesanctisBehling, Karen, MD in 7 days   Get CBC/BMP checked in next visit within 1 week by PCP or SNF MD ( we routinely change or add medications that can affect your baseline labs and fluid status, therefore we recommend that you get the mentioned basic workup next visit with your PCP, your PCP may decide not to get them or add new tests based on their clinical decision)  On your next visit with your PCP, please Get Medicines reviewed and adjusted.  Please request your PCP  to go over all Hospital Tests and Procedure/Radiological results at the follow up, please get all Hospital records sent to your Prim MD by signing hospital release before you go home.  Activity: As tolerated with Full fall precautions use walker/cane & assistance as  needed  For Heart failure patients - Check your Weight same time everyday, if you gain over 2 pounds, or you develop in leg swelling, experience more shortness of breath or chest pain, call your Primary MD immediately. Follow Cardiac Low Salt Diet and 1.5 lit/day fluid restriction.  If you have smoked or chewed Tobacco in the last 2 yrs please stop smoking, stop any regular Alcohol  and or any Recreational drug use.  If you experience worsening of your admission symptoms, develop shortness of breath, life threatening emergency, suicidal or homicidal thoughts you must seek medical attention immediately by calling 911 or calling your MD immediately  if symptoms less severe.  You Must read complete instructions/literature along with all the possible adverse reactions/side effects for all the Medicines you take and that have been prescribed to you. Take any new Medicines after you have completely understood and accpet all the possible adverse reactions/side effects.   Do not drive, operate heavy machinery, perform activities at heights, swimming or participation in water activities or provide baby sitting services if your were admitted for syncope  or siezures until you have seen by Primary MD or a Neurologist and advised to do so again.  Do not drive when taking Pain medications.  Do not take more than prescribed Pain, Sleep and Anxiety Medications  Wear Seat belts while driving.   Please note You were cared for by a hospitalist during your hospital stay. If you have any questions about your discharge medications or the care you received while you were in the hospital after you are discharged, you can call the unit and asked to speak with the hospitalist on call if the hospitalist that took care of you is not available. Once you are discharged, your primary care physician will handle any further medical issues. Please note that NO REFILLS for any discharge medications will be authorized once you are  discharged, as it is imperative that you return to your primary care physician (or establish a relationship with a primary care physician if you do not have one) for your aftercare needs so that they can reassess your need for medications and monitor your lab values.    Allergies as of 09/11/2020      Reactions   Amlodipine Other (See Comments)   dizziness   Penicillins Itching   Tape Rash      Medication List    STOP taking these medications   cephALEXin 500 MG capsule Commonly known as: KEFLEX   methocarbamol 500 MG tablet Commonly known as: ROBAXIN   metoCLOPramide 5 MG tablet Commonly known as: REGLAN   omeprazole 20 MG capsule Commonly known as: PRILOSEC   oxyCODONE 5 MG immediate release tablet Commonly known as: Oxy IR/ROXICODONE   Potassium Chloride ER 20 MEQ Tbcr   tiZANidine 2 MG tablet Commonly known as: ZANAFLEX     TAKE these medications   allopurinol 100 MG tablet Commonly known as: ZYLOPRIM Take 100 mg by mouth 2 (two) times daily.   carvedilol 12.5 MG tablet Commonly known as: COREG Take 1 tablet (12.5 mg total) by mouth 2 (two) times daily. What changed:   medication strength  how much to take   colchicine 0.6 MG tablet Take 0.6 mg by mouth as needed.   Eliquis 5 MG Tabs tablet Generic drug: apixaban Take 1 tablet by mouth 2 (two) times daily.   flecainide 100 MG tablet Commonly known as: TAMBOCOR Take 100 mg by mouth 2 (two) times daily.   furosemide 20 MG tablet Commonly known as: LASIX Take 1 tablet (20 mg total) by mouth daily. What changed:   medication strength  how much to take   hydrALAZINE 25 MG tablet Commonly known as: APRESOLINE Take 1 tablet (25 mg total) by mouth 2 (two) times daily. What changed:   medication strength  how much to take   levothyroxine 175 MCG tablet Commonly known as: SYNTHROID Take 175 mcg by mouth daily before breakfast.   loperamide 2 MG capsule Commonly known as: IMODIUM Take 1  capsule (2 mg total) by mouth 2 (two) times daily as needed for up to 15 days for diarrhea or loose stools.   losartan 100 MG tablet Commonly known as: COZAAR Take 1 tablet (100 mg total) by mouth daily. What changed:   how much to take  when to take this   oxyCODONE-acetaminophen 5-325 MG tablet Commonly known as: PERCOCET/ROXICET Take 1 tablet by mouth every 8 (eight) hours as needed.   vancomycin 50 mg/mL  oral solution Commonly known as: VANCOCIN Take 2.5 mLs (125 mg total) by mouth every 6 (  six) hours for 3 days.       Time coordinating discharge: 35 minutes  The results of significant diagnostics from this hospitalization (including imaging, microbiology, ancillary and laboratory) are listed below for reference.    Procedures and Diagnostic Studies:   CT ABDOMEN PELVIS WO CONTRAST  Result Date: 08/30/2020 CLINICAL DATA:  Nonlocalized acute abdominal pain. Nausea vomiting and diarrhea. Fever. Recent hospitalization with antibiotic use. EXAM: CT ABDOMEN AND PELVIS WITHOUT CONTRAST TECHNIQUE: Multidetector CT imaging of the abdomen and pelvis was performed following the standard protocol without IV contrast. COMPARISON:  Chest x-ray 07/08/2014 FINDINGS: Lower chest: Passive atelectasis in the paravertebral right lower lobe. Calcified left base 8 mm pulmonary nodule consistent with known granuloma. Partially visualized cardiac leads. Hepatobiliary: The liver is enlarged measuring up to 22 cm. No focal liver abnormality. Nonspecific hydropic gallbladder. No gallstones, gallbladder wall thickening, or pericholecystic fluid. No biliary dilatation. Pancreas: No focal lesion. Normal pancreatic contour. No surrounding inflammatory changes. No main pancreatic ductal dilatation. Spleen: Multiple punctate calcifications likely sequelae of prior granulomatous disease. Normal in size without focal abnormality. Adrenals/Urinary Tract: No adrenal nodule bilaterally. No nephrolithiasis, no  hydronephrosis, and no contour-deforming renal mass. No ureterolithiasis or hydroureter. The urinary bladder is unremarkable. Stomach/Bowel: Stomach is within normal limits. No evidence of small bowel wall thickening or dilatation. Extensive circumferential bowel wall thickening of the entire colon. Associated pericolonic fat stranding. Diffuse left colon and sigmoid diverticulosis. The appendix is not definitely identified. Vascular/Lymphatic: No portal venous gas. No mesenteric gas. No abdominal aorta or iliac aneurysm. Mild atherosclerotic plaque of the aorta and its branches. No abdominal, pelvic, or inguinal lymphadenopathy. Reproductive: Borderline enlarged retroperitoneal lymph nodes with no definite lymphadenopathy. Other: No intraperitoneal free fluid. No intraperitoneal free gas. No organized fluid collection. Musculoskeletal: Supraumbilical ventral wall hernia containing fat (6:75, 2:38 with an abdominal defect of 2.4 by 2.1 cm. Inferiorly there are two Sferrazza adjacent hernias containing contiguous loops of transverse colon (2:48, 2:51). The abdominal defects for these hernias measure approximately 4.6 by 4.7 cm (2:47, 6:75) and 2.7 by 1.6 cm (2:49, 6:95). Just inferiorly there is a Guidone paraumbilical hernia containing several loops of small bowel including the terminal ileum with an abdominal defect of 7.4 x 5 cm (6:78). Mesenteric fat stranding is noted within these hernias. IMPRESSION: 1. Extensive pancolitis with no associated bowel perforation or obstruction. No pneumatosis with however limited evaluation for bowel ischemia on this noncontrast study. Recommend correlation with stool cultures. 2. Total of 4 complex ventral wall hernias with the 3 most inferior ones containing small and Rossmann bowel. Associated mesenteric fat stranding. Associated ischemia not excluded. Consider correlation with lactate levels. 3. Other imaging findings of potential clinical significance: Hepatomegaly. Sequela of prior  granulomatous disease. Aortic Atherosclerosis (ICD10-I70.0). These results were called by telephone at the time of interpretation on 08/30/2020 at 11:24 pm to provider Duffy Bruce , who verbally acknowledged these results. Electronically Signed   By: Iven Finn M.D.   On: 08/30/2020 23:23   DG Chest Port 1 View  Result Date: 08/30/2020 CLINICAL DATA:  Questionable sepsis. EXAM: PORTABLE CHEST 1 VIEW.  Patient is rotated. COMPARISON:  chest x-ray 05/29/2017 FINDINGS: Left chest wall cardiac pacemaker with 2 leads in grossly appropriate position. Slightly more prominent cardiac silhouette likely due to portable AP technique and patient rotation. The heart size and mediastinal contours are unchanged. Elevated left hemidiaphragm. Similar-appearing left hilar calcified lymph node. Redemonstration of a calcified granuloma at the left base. No focal consolidation. No  pulmonary edema. No pleural effusion. No pneumothorax. No acute osseous abnormality. IMPRESSION: No active disease in a patient status post granulomatous disease involving the left lung. Electronically Signed   By: Iven Finn M.D.   On: 08/30/2020 22:45     Labs:   Basic Metabolic Panel: Recent Labs  Lab 09/10/20 0544  NA 140  K 3.9  CL 109  CO2 26  GLUCOSE 91  BUN 17  CREATININE 0.69  CALCIUM 8.0*  MG 1.7  PHOS 3.0   GFR Estimated Creatinine Clearance: 97.3 mL/min (by C-G formula based on SCr of 0.69 mg/dL). Liver Function Tests: Recent Labs  Lab 09/10/20 0544  AST 29  ALT 16  ALKPHOS 61  BILITOT 0.4  PROT 4.3*  ALBUMIN 2.2*   No results for input(s): LIPASE, AMYLASE in the last 168 hours. No results for input(s): AMMONIA in the last 168 hours. Coagulation profile No results for input(s): INR, PROTIME in the last 168 hours.  CBC: Recent Labs  Lab 09/06/20 0516 09/10/20 0544  WBC 9.4 6.1  NEUTROABS  --  4.2  HGB 10.4* 9.7*  HCT 31.4* 29.3*  MCV 100.0 99.7  PLT 288 271   Cardiac Enzymes: No  results for input(s): CKTOTAL, CKMB, CKMBINDEX, TROPONINI in the last 168 hours. BNP: Invalid input(s): POCBNP CBG: No results for input(s): GLUCAP in the last 168 hours. D-Dimer No results for input(s): DDIMER in the last 72 hours. Hgb A1c No results for input(s): HGBA1C in the last 72 hours. Lipid Profile No results for input(s): CHOL, HDL, LDLCALC, TRIG, CHOLHDL, LDLDIRECT in the last 72 hours. Thyroid function studies No results for input(s): TSH, T4TOTAL, T3FREE, THYROIDAB in the last 72 hours.  Invalid input(s): FREET3 Anemia work up No results for input(s): VITAMINB12, FOLATE, FERRITIN, TIBC, IRON, RETICCTPCT in the last 72 hours. Microbiology No results found for this or any previous visit (from the past 240 hour(s)).   Signed: Terrilee Croak  [facility] 09/11/2020, 12:38 PM

## 2020-09-12 DIAGNOSIS — R6521 Severe sepsis with septic shock: Secondary | ICD-10-CM

## 2020-09-12 NOTE — TOC Transition Note (Addendum)
Transition of Care [facility]) - CM/SW Discharge Note   Patient Details  Name: [name] [name] MRN: [id number] Date of Birth: Sep 06, 1955  [facility] CM/SW Contact:  Harriet Masson, RN Phone Number: [phone] 09/12/2020, 12:30 PM   Clinical Narrative:    Spoke with who with update that Workmen's comp has not notified [place] of an authorization for [employer]. Pt will be discharged without [employer] approved today. MD has called all medications into the pt's local pharmacy [facility]). TOC RN called to confirmed hours of operation. [facility] closes at 6 pm and has 4 prescribed medications that are ready for pick up for this pt. The cost of her medication at $37.08. Pt states she maybe able to get one of co-workers to pick her up between 3-4 pm take her to [place] then her residence (condo) where she resides with her mother. TOC RN offered a taxi voucher as a back up (pt receptive). Pt states she has a 3-1 commode, RW already in the home for DME. No additional  Needs at this time.  TOC will remain available to assist until pt is discharged today.    Final next level of care: Home/Self Care Barriers to Discharge: Barriers Resolved   Patient Goals and CMS Choice        Discharge Placement                    Patient and family notified of of transfer: 09/12/20  Discharge Plan and Services                                     Social Determinants of Health (SDOH) Interventions     Readmission Risk Interventions No flowsheet data found.

## 2020-09-12 NOTE — Progress Notes (Signed)
Patient is being discharged home.  Discharge papers given and explained to pt.  Pt verbalized understanding.  Meds and f/u appointment reviewed. Rx sent electronically to the pharmacy. Pt made aware.  Awaiting transportation.

## 2020-09-12 NOTE — Progress Notes (Signed)
Patient was prepared for discharge yesterday.  However home health services was not approved by [employer].  Patient did not feel comfortable being by herself at home. Discharge was held. Briefly seen this morning.  Feels good to go home today. Discussed with care management and nursing staff. Nonbillable visit today.

## 2020-09-27 ENCOUNTER — Inpatient Hospital Stay
Admission: EM | Admit: 2020-09-27 | Discharge: 2020-09-30 | DRG: 373 | Disposition: A | Discharge status: Home / Self Care | Payer: Worker's Compensation | Attending: Student | Admitting: Student

## 2020-09-27 ENCOUNTER — Other Ambulatory Visit: Payer: Self-pay

## 2020-09-27 ENCOUNTER — Emergency Department: Payer: Worker's Compensation

## 2020-09-27 DIAGNOSIS — I10 Essential (primary) hypertension: Secondary | ICD-10-CM | POA: Diagnosis present

## 2020-09-27 DIAGNOSIS — N809 Endometriosis, unspecified: Secondary | ICD-10-CM | POA: Diagnosis present

## 2020-09-27 DIAGNOSIS — D649 Anemia, unspecified: Secondary | ICD-10-CM | POA: Diagnosis present

## 2020-09-27 DIAGNOSIS — A0472 Enterocolitis due to Clostridium difficile, not specified as recurrent: Secondary | ICD-10-CM | POA: Diagnosis not present

## 2020-09-27 DIAGNOSIS — Z20822 Contact with and (suspected) exposure to covid-19: Secondary | ICD-10-CM | POA: Diagnosis present

## 2020-09-27 DIAGNOSIS — A0471 Enterocolitis due to Clostridium difficile, recurrent: Secondary | ICD-10-CM | POA: Diagnosis present

## 2020-09-27 DIAGNOSIS — Z8249 Family history of ischemic heart disease and other diseases of the circulatory system: Secondary | ICD-10-CM

## 2020-09-27 DIAGNOSIS — M109 Gout, unspecified: Secondary | ICD-10-CM | POA: Diagnosis present

## 2020-09-27 DIAGNOSIS — R0789 Other chest pain: Secondary | ICD-10-CM | POA: Diagnosis present

## 2020-09-27 DIAGNOSIS — I4891 Unspecified atrial fibrillation: Secondary | ICD-10-CM | POA: Diagnosis present

## 2020-09-27 DIAGNOSIS — R933 Abnormal findings on diagnostic imaging of other parts of digestive tract: Secondary | ICD-10-CM | POA: Diagnosis not present

## 2020-09-27 DIAGNOSIS — Z90710 Acquired absence of both cervix and uterus: Secondary | ICD-10-CM | POA: Diagnosis not present

## 2020-09-27 DIAGNOSIS — Z833 Family history of diabetes mellitus: Secondary | ICD-10-CM

## 2020-09-27 DIAGNOSIS — R7303 Prediabetes: Secondary | ICD-10-CM | POA: Diagnosis present

## 2020-09-27 DIAGNOSIS — Z950 Presence of cardiac pacemaker: Secondary | ICD-10-CM

## 2020-09-27 DIAGNOSIS — Z91048 Other nonmedicinal substance allergy status: Secondary | ICD-10-CM

## 2020-09-27 DIAGNOSIS — Z888 Allergy status to other drugs, medicaments and biological substances status: Secondary | ICD-10-CM

## 2020-09-27 DIAGNOSIS — E876 Hypokalemia: Secondary | ICD-10-CM | POA: Diagnosis present

## 2020-09-27 DIAGNOSIS — E039 Hypothyroidism, unspecified: Secondary | ICD-10-CM | POA: Diagnosis present

## 2020-09-27 DIAGNOSIS — A419 Sepsis, unspecified organism: Secondary | ICD-10-CM | POA: Diagnosis not present

## 2020-09-27 DIAGNOSIS — Z6836 Body mass index (BMI) 36.0-36.9, adult: Secondary | ICD-10-CM

## 2020-09-27 DIAGNOSIS — Z79899 Other long term (current) drug therapy: Secondary | ICD-10-CM

## 2020-09-27 DIAGNOSIS — Z880 Allergy status to penicillin: Secondary | ICD-10-CM

## 2020-09-27 DIAGNOSIS — R197 Diarrhea, unspecified: Secondary | ICD-10-CM

## 2020-09-27 DIAGNOSIS — M10171 Lead-induced gout, right ankle and foot: Secondary | ICD-10-CM | POA: Diagnosis not present

## 2020-09-27 DIAGNOSIS — T560X1A Toxic effect of lead and its compounds, accidental (unintentional), initial encounter: Secondary | ICD-10-CM | POA: Diagnosis not present

## 2020-09-27 DIAGNOSIS — K439 Ventral hernia without obstruction or gangrene: Secondary | ICD-10-CM | POA: Diagnosis present

## 2020-09-27 DIAGNOSIS — I495 Sick sinus syndrome: Secondary | ICD-10-CM | POA: Diagnosis present

## 2020-09-27 DIAGNOSIS — I480 Paroxysmal atrial fibrillation: Secondary | ICD-10-CM | POA: Diagnosis present

## 2020-09-27 DIAGNOSIS — Z7901 Long term (current) use of anticoagulants: Secondary | ICD-10-CM

## 2020-09-27 DIAGNOSIS — E669 Obesity, unspecified: Secondary | ICD-10-CM | POA: Diagnosis present

## 2020-09-27 DIAGNOSIS — D7589 Other specified diseases of blood and blood-forming organs: Secondary | ICD-10-CM | POA: Diagnosis present

## 2020-09-27 DIAGNOSIS — K219 Gastro-esophageal reflux disease without esophagitis: Secondary | ICD-10-CM | POA: Diagnosis present

## 2020-09-27 DIAGNOSIS — W19XXXA Unspecified fall, initial encounter: Secondary | ICD-10-CM | POA: Diagnosis not present

## 2020-09-27 DIAGNOSIS — Z79890 Hormone replacement therapy (postmenopausal): Secondary | ICD-10-CM

## 2020-09-27 LAB — LIPASE, BLOOD: Lipase: 27 U/L (ref 11–51)

## 2020-09-27 LAB — COMPREHENSIVE METABOLIC PANEL
ALT: 15 U/L (ref 0–44)
AST: 21 U/L (ref 15–41)
Albumin: 3.5 g/dL (ref 3.5–5.0)
Alkaline Phosphatase: 76 U/L (ref 38–126)
Anion gap: 9 (ref 5–15)
BUN: 16 mg/dL (ref 8–23)
CO2: 25 mmol/L (ref 22–32)
Calcium: 8.9 mg/dL (ref 8.9–10.3)
Chloride: 102 mmol/L (ref 98–111)
Creatinine, Ser: 0.77 mg/dL (ref 0.44–1.00)
GFR, Estimated: >60 mL/min (ref >60)
Glucose, Bld: 112 mg/dL — ABNORMAL HIGH (ref 70–99)
Potassium: 3.4 mmol/L — ABNORMAL LOW (ref 3.5–5.1)
Sodium: 136 mmol/L (ref 135–145)
Total Bilirubin: 0.9 mg/dL (ref 0.3–1.2)
Total Protein: 6.4 g/dL — ABNORMAL LOW (ref 6.5–8.1)

## 2020-09-27 LAB — GASTROINTESTINAL PANEL BY PCR, STOOL (REPLACES STOOL CULTURE)

## 2020-09-27 LAB — CBC WITH DIFFERENTIAL/PLATELET
Abs Immature Granulocytes: 0.08 10*3/uL — ABNORMAL HIGH (ref 0.00–0.07)
Basophils Absolute: 0.1 10*3/uL (ref 0.0–0.1)
Basophils Relative: 0 %
Eosinophils Absolute: 0.1 10*3/uL (ref 0.0–0.5)
Eosinophils Relative: 0 %
HCT: 36.8 % (ref 36.0–46.0)
Hemoglobin: 12.2 g/dL (ref 12.0–15.0)
Immature Granulocytes: 1 %
Lymphocytes Relative: 7 %
Lymphs Abs: 1 10*3/uL (ref 0.7–4.0)
MCH: 33.5 pg (ref 26.0–34.0)
MCHC: 33.2 g/dL (ref 30.0–36.0)
MCV: 101.1 fL — ABNORMAL HIGH (ref 80.0–100.0)
Monocytes Absolute: 1.7 10*3/uL — ABNORMAL HIGH (ref 0.1–1.0)
Monocytes Relative: 12 %
Neutro Abs: 12 10*3/uL — ABNORMAL HIGH (ref 1.7–7.7)
Neutrophils Relative %: 80 %
Platelets: 234 10*3/uL (ref 150–400)
RBC: 3.64 MIL/uL — ABNORMAL LOW (ref 3.87–5.11)
RDW: 14.1 % (ref 11.5–15.5)
WBC: 14.9 10*3/uL — ABNORMAL HIGH (ref 4.0–10.5)
nRBC: 0 % (ref 0.0–0.2)

## 2020-09-27 LAB — C DIFFICILE QUICK SCREEN W PCR REFLEX
C Diff antigen: POSITIVE — AB
C Diff interpretation: DETECTED
C Diff toxin: POSITIVE — AB

## 2020-09-27 LAB — BRAIN NATRIURETIC PEPTIDE: B Natriuretic Peptide: 268.6 pg/mL — ABNORMAL HIGH (ref 0.0–100.0)

## 2020-09-27 LAB — PROTIME-INR
INR: 1.2 (ref 0.8–1.2)
Prothrombin Time: 14.9 seconds (ref 11.4–15.2)

## 2020-09-27 LAB — APTT: aPTT: 35 seconds (ref 24–36)

## 2020-09-27 LAB — MAGNESIUM: Magnesium: 1.6 mg/dL — ABNORMAL LOW (ref 1.7–2.4)

## 2020-09-27 LAB — RESP PANEL BY RT-PCR (FLU A&B, COVID) ARPGX2
Influenza A by PCR: NEGATIVE
Influenza B by PCR: NEGATIVE
SARS Coronavirus 2 by RT PCR: NEGATIVE

## 2020-09-27 LAB — LACTIC ACID, PLASMA: Lactic Acid, Venous: 1.6 mmol/L (ref 0.5–1.9)

## 2020-09-27 MED ORDER — POTASSIUM CHLORIDE CRYS ER 20 MEQ PO TBCR
40.0000 meq | EXTENDED_RELEASE_TABLET | Freq: Once | ORAL | Status: AC
  Administered 2020-09-27: 40 meq via ORAL
  Filled 2020-09-27: qty 2

## 2020-09-27 MED ORDER — CARVEDILOL 6.25 MG PO TABS
12.5000 mg | ORAL_TABLET | Freq: Two times a day (BID) | ORAL | Status: DC
  Administered 2020-09-27 – 2020-09-30 (×6): 12.5 mg via ORAL
  Filled 2020-09-27 (×6): qty 2

## 2020-09-27 MED ORDER — LACTATED RINGERS IV SOLN: INTRAVENOUS | Status: AC

## 2020-09-27 MED ORDER — METRONIDAZOLE 500 MG/100ML IV SOLN
500.0000 mg | Freq: Once | INTRAVENOUS | Status: AC
  Administered 2020-09-27: 500 mg via INTRAVENOUS
  Filled 2020-09-27: qty 100

## 2020-09-27 MED ORDER — HYDRALAZINE HCL 50 MG PO TABS
25.0000 mg | ORAL_TABLET | Freq: Two times a day (BID) | ORAL | Status: DC
  Administered 2020-09-27 – 2020-09-30 (×7): 25 mg via ORAL
  Filled 2020-09-27 (×7): qty 1

## 2020-09-27 MED ORDER — CARVEDILOL 6.25 MG PO TABS: 12.5000 mg | ORAL_TABLET | Freq: Two times a day (BID) | ORAL | Status: DC

## 2020-09-27 MED ORDER — LEVOTHYROXINE SODIUM 50 MCG PO TABS: 175.0000 ug | ORAL_TABLET | Freq: Every day | ORAL | Status: DC

## 2020-09-27 MED ORDER — OXYCODONE-ACETAMINOPHEN 5-325 MG PO TABS: 1.0000 | ORAL_TABLET | ORAL | Status: DC | PRN

## 2020-09-27 MED ORDER — HYDRALAZINE HCL 20 MG/ML IJ SOLN: 5.0000 mg | INTRAMUSCULAR | Status: DC | PRN

## 2020-09-27 MED ORDER — FENTANYL CITRATE (PF) 100 MCG/2ML IJ SOLN
50.0000 ug | Freq: Once | INTRAMUSCULAR | Status: AC
Start: 2020-09-27 — End: 2020-09-27
  Administered 2020-09-27: 50 ug via INTRAVENOUS
  Filled 2020-09-27: qty 2

## 2020-09-27 MED ORDER — MORPHINE SULFATE (PF) 2 MG/ML IV SOLN: 2.0000 mg | INTRAVENOUS | Status: DC | PRN

## 2020-09-27 MED ORDER — ONDANSETRON HCL 4 MG/2ML IJ SOLN
4.0000 mg | Freq: Three times a day (TID) | INTRAMUSCULAR | Status: DC | PRN
  Administered 2020-09-27: 16:00:00 4 mg via INTRAVENOUS
  Filled 2020-09-27: qty 2

## 2020-09-27 MED ORDER — LEVOTHYROXINE SODIUM 50 MCG PO TABS
175.0000 ug | ORAL_TABLET | Freq: Every day | ORAL | Status: DC
  Administered 2020-09-27 – 2020-09-30 (×4): 175 ug via ORAL
  Filled 2020-09-27 (×4): qty 1

## 2020-09-27 MED ORDER — COLCHICINE 0.6 MG PO TABS
0.6000 mg | ORAL_TABLET | Freq: Every day | ORAL | Status: DC | PRN
  Filled 2020-09-27: qty 1

## 2020-09-27 MED ORDER — LACTATED RINGERS IV SOLN: INTRAVENOUS | Status: DC

## 2020-09-27 MED ORDER — LACTATED RINGERS IV BOLUS
1000.0000 mL | Freq: Once | INTRAVENOUS | Status: AC
  Administered 2020-09-27: 1000 mL via INTRAVENOUS

## 2020-09-27 MED ORDER — FIDAXOMICIN 200 MG PO TABS
200.0000 mg | ORAL_TABLET | Freq: Two times a day (BID) | ORAL | Status: DC
  Administered 2020-09-27 – 2020-09-30 (×7): 200 mg via ORAL
  Filled 2020-09-27 (×8): qty 1

## 2020-09-27 MED ORDER — ONDANSETRON HCL 4 MG/2ML IJ SOLN
4.0000 mg | Freq: Once | INTRAMUSCULAR | Status: AC
  Administered 2020-09-27: 4 mg via INTRAVENOUS
  Filled 2020-09-27: qty 2

## 2020-09-27 MED ORDER — ALLOPURINOL 100 MG PO TABS
100.0000 mg | ORAL_TABLET | Freq: Two times a day (BID) | ORAL | Status: DC
  Administered 2020-09-27 – 2020-09-30 (×6): 100 mg via ORAL
  Filled 2020-09-27 (×6): qty 1

## 2020-09-27 MED ORDER — ACETAMINOPHEN 325 MG PO TABS
650.0000 mg | ORAL_TABLET | Freq: Four times a day (QID) | ORAL | Status: DC | PRN
  Administered 2020-09-27: 16:00:00 650 mg via ORAL
  Filled 2020-09-27: qty 2

## 2020-09-27 MED ORDER — FLECAINIDE ACETATE 100 MG PO TABS
100.0000 mg | ORAL_TABLET | Freq: Two times a day (BID) | ORAL | Status: DC
  Administered 2020-09-27 – 2020-09-30 (×7): 100 mg via ORAL
  Filled 2020-09-27 (×8): qty 1

## 2020-09-27 MED ORDER — ACETAMINOPHEN 500 MG PO TABS
1000.0000 mg | ORAL_TABLET | Freq: Once | ORAL | Status: AC
  Administered 2020-09-27: 1000 mg via ORAL
  Filled 2020-09-27: qty 2

## 2020-09-27 MED ORDER — LOSARTAN POTASSIUM 50 MG PO TABS
100.0000 mg | ORAL_TABLET | Freq: Every day | ORAL | Status: DC
  Administered 2020-09-27 – 2020-09-30 (×4): 100 mg via ORAL
  Filled 2020-09-27 (×4): qty 2

## 2020-09-27 MED ORDER — APIXABAN 5 MG PO TABS
5.0000 mg | ORAL_TABLET | Freq: Two times a day (BID) | ORAL | Status: DC
  Administered 2020-09-27 – 2020-09-30 (×7): 5 mg via ORAL
  Filled 2020-09-27 (×7): qty 1

## 2020-09-27 MED ORDER — LOSARTAN POTASSIUM 50 MG PO TABS: 100.0000 mg | ORAL_TABLET | Freq: Every day | ORAL | Status: DC

## 2020-09-27 NOTE — Progress Notes (Signed)
Chart reviewed Spoke to patient Pt was recently in [facility] between 08/30/20-09/11/20 for sever cdiff colitis.h/o Afib, pacemaker, HTN, CHF, hypothyroidism Was treated with vancomycin . On discharge was given a prescription for liquid vanco but she couldn't  Fill the prescription. It was changed to capsule and she started  Taking it on Monday. She was doing okay until June 2nd when she started having frequent stool and then diarrhea. She works at [employer] and went to [employer] and got [place] on June 3rd. She took 4 doses and also took imodium. She felt weak, cold, gurgling stomach, poor appetite      She has been started on fidoxamicin She is feeling okay now She has not received any of her other meds today Will let hospitalist know.

## 2020-09-27 NOTE — H&P (Signed)
History and Physical    [name] [name] BLT:[id number] DOB: [birth year] DOA: 09/27/2020  Referring MD/NP/PA:   PCP: Barbaraann Boys, MD   Patient coming from:  The patient is coming from home.  At baseline, pt is independent for most of ADL.        Chief Complaint: Abdominal pain, nausea, vomiting, diarrhea  HPI: [name] [name] is a 65 y.o. female with medical history significant of hypertension, prediabetes, GERD, hypothyroidism, gout, tachybradycardia syndrome, s/p of pacemaker placement, atrial fibrillation on Eliquis, anemia, endometriosis, C. difficile colitis, obesity, who presents with abdominal pain, nausea, vomiting, diarrhea.  Patient was recently hospitalized to ICU due to septic shock secondary to C. difficile colitis from 5/8-5/20.  Patient was discharged on oral vancomycin. She states that started having worsening abdominal pain, nausea, vomiting and diarrhea 3 days ago.  She has had multiple episode of nonbloody normal bilious vomiting.  Several episodes of diarrhea.  The abdominal pain is mainly located in the lower abdomen, constant, sharp, moderate, nonradiating.  Patient has fever and chills.  Denies chest pain, shortness breath, cough.  No symptoms of UTI.  She was restarted oral vancomycin on Friday, without significant improvement.  ED Course: pt was found to have WBC 14.9, lactic acid 1.6, negative COVID PCR, GI pathogen panel negative, C. difficile test positive for antigen and toxin, electrolytes renal function okay, temperature 100.1, blood pressure 150/74, heart rate 67, RR 20.  Patient is admitted to [facility] bed as inpatient.  Dr. Zonia Kief of ID and Dr. Celine Ahr of surgery are consulted.  CT-abdomen/pelvis: Improving inflammatory changes involving the colon with residual inflammatory change identified involving the ascending colon and distal colon, most severe within the splenic flexure. While this most likely represents the residua of prior  reported pseudomembranous colitis, ischemic colitis could be considered given the watershed distribution of the area of primary inflammation, particularly if there is been a history of significant hypotension. No evidence of a obstruction or perforation.  Complex supraumbilical ventral hernia with at least 4 separate hernia sacs containing multiple loops of Halliday and small bowel. No evidence of obstruction.  Pancolonic diverticulosis, severe within the sigmoid colon.  Aortic Atherosclerosis (ICD10-I70.0).   Review of Systems:   General: has fevers, chills, no body weight gain, has poor appetite, has fatigue HEENT: no blurry vision, hearing changes or sore throat Respiratory: no dyspnea, coughing, wheezing CV: no chest pain, no palpitations GI: has nausea, vomiting, abdominal pain, diarrhea, no constipation GU: no dysuria, burning on urination, increased urinary frequency, hematuria  Ext: no leg edema Neuro: no unilateral weakness, numbness, or tingling, no vision change or hearing loss Skin: no rash, no skin tear. MSK: No muscle spasm, no deformity, no limitation of range of movement in spin Heme: No easy bruising.  Travel history: No recent long distant travel.  Allergy:  Allergies  Allergen Reactions  . Amlodipine Other (See Comments)    dizziness  . Penicillins Itching  . Tape Rash    Past Medical History:  Diagnosis Date  . Anemia   . Arrhythmia   . Atrial fibrillation ([place])   . Cancer ([place])   . Endometriosis   . GERD (gastroesophageal reflux disease)   . Gout   . Heart disease   . Hypothyroidism   . Obesity   . Prediabetes   . Renal disorder   . Ventricular hypertrophy     Past Surgical History:  Procedure Laterality Date  . ABDOMINAL HYSTERECTOMY    . ADENOIDECTOMY    .  DILATION AND CURETTAGE OF UTERUS    . TONSILLECTOMY      Social History:  reports that she has never smoked. She has never used smokeless tobacco. She reports that she does not  drink alcohol and does not use drugs.  Family History:  Family History  Problem Relation Age of Onset  . Diabetes Mother   . Hypertension Mother   . Heart attack Father      Prior to Admission medications   Medication Sig Start Date End Date Taking? Authorizing Provider  allopurinol (ZYLOPRIM) 100 MG tablet Take 100 mg by mouth 2 (two) times daily.    [provider]  carvedilol (COREG) 12.5 MG tablet Take 1 tablet (12.5 mg total) by mouth 2 (two) times daily. 09/11/20 12/10/20  Terrilee Croak, MD  colchicine 0.6 MG tablet Take 0.6 mg by mouth as needed.    [provider]  ELIQUIS 5 MG TABS tablet Take 1 tablet by mouth 2 (two) times daily. Patient not taking: No sig reported 08/08/20   [provider]  flecainide (TAMBOCOR) 100 MG tablet Take 100 mg by mouth 2 (two) times daily. 08/13/20   [provider]  furosemide (LASIX) 20 MG tablet Take 1 tablet (20 mg total) by mouth daily. 09/11/20 12/10/20  Terrilee Croak, MD  hydrALAZINE (APRESOLINE) 25 MG tablet Take 1 tablet (25 mg total) by mouth 2 (two) times daily. 09/11/20 12/10/20  Terrilee Croak, MD  levothyroxine (SYNTHROID, LEVOTHROID) 175 MCG tablet Take 175 mcg by mouth daily before breakfast.    [provider]  losartan (COZAAR) 100 MG tablet Take 1 tablet (100 mg total) by mouth daily. 09/11/20 12/10/20  Terrilee Croak, MD  oxyCODONE-acetaminophen (PERCOCET/ROXICET) 5-325 MG tablet Take 1 tablet by mouth every 8 (eight) hours as needed. 08/17/20   [provider]    Physical Exam: Vitals:   09/27/20 0512 09/27/20 0806 09/27/20 1112 09/27/20 1557  BP: (!) 141/76 (!) 150/74 136/62 (!) 157/82  Pulse: 67 67 67 77  Resp: 17 20 17 17   Temp:  98.7 F (37.1 C) 98.8 F (37.1 C) 99.4 F (37.4 C)  TempSrc:  Oral Oral Oral  SpO2: 94% 94% 95% 92%  Weight:      Height:       General: Not in acute distress HEENT:       Eyes: PERRL, EOMI, no scleral icterus.       ENT: No discharge from the  ears and nose, no pharynx injection, no tonsillar enlargement.        Neck: No JVD, no bruit, no mass felt. Heme: No neck lymph node enlargement. Cardiac: S1/S2, RRR, No murmurs, No gallops or rubs. Respiratory: No rales, wheezing, rhonchi or rubs. GI: Soft, nondistended, has tenderness in lower abdomen, no rebound pain, no organomegaly, BS present. GU: No hematuria Ext: No pitting leg edema bilaterally. 1+DP/PT pulse bilaterally. Musculoskeletal: No joint deformities, No joint redness or warmth, no limitation of ROM in spin. Skin: No rashes.  Neuro: Alert, oriented X3, cranial nerves II-XII grossly intact, moves all extremities normally.  Psych: Patient is not psychotic, no suicidal or hemocidal ideation.  Labs on Admission: I have personally reviewed following labs and imaging studies  CBC: Recent Labs  Lab 09/27/20 0113  WBC 14.9*  NEUTROABS 12.0*  HGB 12.2  HCT 36.8  MCV 101.1*  PLT 735   Basic Metabolic Panel: Recent Labs  Lab 09/27/20 0113 09/27/20 1153  NA 136  --   K 3.4*  --  CL 102  --   CO2 25  --   GLUCOSE 112*  --   BUN 16  --   CREATININE 0.77  --   CALCIUM 8.9  --   MG  --  1.6*   GFR: Estimated Creatinine Clearance: 92.4 mL/min (by C-G formula based on SCr of 0.77 mg/dL). Liver Function Tests: Recent Labs  Lab 09/27/20 0113  AST 21  ALT 15  ALKPHOS 76  BILITOT 0.9  PROT 6.4*  ALBUMIN 3.5   Recent Labs  Lab 09/27/20 0113  LIPASE 27   No results for input(s): AMMONIA in the last 168 hours. Coagulation Profile: Recent Labs  Lab 09/27/20 1153  INR 1.2   Cardiac Enzymes: No results for input(s): CKTOTAL, CKMB, CKMBINDEX, TROPONINI in the last 168 hours. BNP (last 3 results) No results for input(s): PROBNP in the last 8760 hours. HbA1C: No results for input(s): HGBA1C in the last 72 hours. CBG: No results for input(s): GLUCAP in the last 168 hours. Lipid Profile: No results for input(s): CHOL, HDL, LDLCALC, TRIG, CHOLHDL, LDLDIRECT  in the last 72 hours. Thyroid Function Tests: No results for input(s): TSH, T4TOTAL, FREET4, T3FREE, THYROIDAB in the last 72 hours. Anemia Panel: No results for input(s): VITAMINB12, FOLATE, FERRITIN, TIBC, IRON, RETICCTPCT in the last 72 hours. Urine analysis:    Component Value Date/Time   COLORURINE YELLOW (A) 08/31/2020 1628   APPEARANCEUR CLEAR (A) 08/31/2020 1628   LABSPEC 1.015 08/31/2020 1628   PHURINE 5.0 08/31/2020 1628   GLUCOSEU NEGATIVE 08/31/2020 1628   HGBUR NEGATIVE 08/31/2020 1628   BILIRUBINUR NEGATIVE 08/31/2020 1628   KETONESUR NEGATIVE 08/31/2020 1628   PROTEINUR NEGATIVE 08/31/2020 1628   NITRITE NEGATIVE 08/31/2020 1628   LEUKOCYTESUR NEGATIVE 08/31/2020 1628   Sepsis Labs: @LABRCNTIP (procalcitonin:4,lacticidven:4) ) Recent Results (from the past 240 hour(s))  Culture, blood (Routine X 2) w Reflex to ID Panel     Status: None (Preliminary result)   Collection Time: 09/27/20  1:34 AM   Specimen: BLOOD  Result Value Ref Range Status   Specimen Description BLOOD RIGHT ANTECUBITAL  Final   Special Requests   Final    BOTTLES DRAWN AEROBIC AND ANAEROBIC Blood Culture adequate volume   Culture   Final    NO GROWTH < 12 HOURS Performed at [facility], [address] [place], [place] [address]    Report Status PENDING  Incomplete  Gastrointestinal Panel by PCR , Stool     Status: None   Collection Time: 09/27/20  1:34 AM   Specimen: Stool  Result Value Ref Range Status   Campylobacter species NOT DETECTED NOT DETECTED Final   Plesimonas shigelloides NOT DETECTED NOT DETECTED Final   Salmonella species NOT DETECTED NOT DETECTED Final   Yersinia enterocolitica NOT DETECTED NOT DETECTED Final   Vibrio species NOT DETECTED NOT DETECTED Final   Vibrio cholerae NOT DETECTED NOT DETECTED Final   Enteroaggregative E coli (EAEC) NOT DETECTED NOT DETECTED Final   Enteropathogenic E coli (EPEC) NOT DETECTED NOT DETECTED Final   Enterotoxigenic E coli  (ETEC) NOT DETECTED NOT DETECTED Final   Shiga like toxin producing E coli (STEC) NOT DETECTED NOT DETECTED Final   Shigella/Enteroinvasive E coli (EIEC) NOT DETECTED NOT DETECTED Final   Cryptosporidium NOT DETECTED NOT DETECTED Final   Cyclospora cayetanensis NOT DETECTED NOT DETECTED Final   Entamoeba histolytica NOT DETECTED NOT DETECTED Final   Giardia lamblia NOT DETECTED NOT DETECTED Final   Adenovirus F40/41 NOT DETECTED NOT DETECTED Final  Astrovirus NOT DETECTED NOT DETECTED Final   Norovirus GI/GII NOT DETECTED NOT DETECTED Final   Rotavirus A NOT DETECTED NOT DETECTED Final   Sapovirus (I, II, IV, and V) NOT DETECTED NOT DETECTED Final    Comment: Performed at  [facility], [address] [place], [place] [address]  C Difficile Quick Screen w PCR reflex     Status: Abnormal   Collection Time: 09/27/20  1:34 AM   Specimen: Stool  Result Value Ref Range Status   C Diff antigen POSITIVE (A) NEGATIVE Final   C Diff toxin POSITIVE (A) NEGATIVE Final   C Diff interpretation Toxin producing C. difficile detected.  Final    Comment: RESULT CALLED TO, READ BACK BY AND VERIFIED WITH: AUSTIN REEVES 09/27/20 0606 LFD Performed at [facility], [address] [place], [place] [address]   Resp Panel by RT-PCR (Flu A&B, Covid) Nasopharyngeal Swab     Status: None   Collection Time: 09/27/20  1:34 AM   Specimen: Nasopharyngeal Swab; Nasopharyngeal(NP) swabs in vial transport medium  Result Value Ref Range Status   SARS Coronavirus 2 by RT PCR NEGATIVE NEGATIVE Final    Comment: (NOTE) SARS-CoV-2 target nucleic acids are NOT DETECTED.  The SARS-CoV-2 RNA is generally detectable in upper respiratory specimens during the acute phase of infection. The lowest concentration of SARS-CoV-2 viral copies this assay can detect is 138 copies/mL. A negative result does not preclude SARS-Cov-2 infection and should not be used as the sole basis for treatment or other patient  management decisions. A negative result may occur with  improper specimen collection/handling, submission of specimen other than nasopharyngeal swab, presence of viral mutation(s) within the areas targeted by this assay, and inadequate number of viral copies(<138 copies/mL). A negative result must be combined with clinical observations, patient history, and epidemiological information. The expected result is Negative.  Fact Sheet for Patients:  EntrepreneurPulse.com.au  Fact Sheet for Healthcare Providers:  IncredibleEmployment.be  This test is no t yet approved or cleared by the [place] FDA and  has been authorized for detection and/or diagnosis of SARS-CoV-2 by FDA under an Emergency Use Authorization (EUA). This EUA will remain  in effect (meaning this test can be used) for the duration of the COVID-19 declaration under Section 564(b)(1) of the Act, 21 U.S.C.section 360bbb-3(b)(1), unless the authorization is terminated  or revoked sooner.       Influenza A by PCR NEGATIVE NEGATIVE Final   Influenza B by PCR NEGATIVE NEGATIVE Final    Comment: (NOTE) The Xpert Xpress SARS-CoV-2/FLU/RSV plus assay is intended as an aid in the diagnosis of influenza from Nasopharyngeal swab specimens and should not be used as a sole basis for treatment. Nasal washings and aspirates are unacceptable for Xpert Xpress SARS-CoV-2/FLU/RSV testing.  Fact Sheet for Patients: EntrepreneurPulse.com.au  Fact Sheet for Healthcare Providers: IncredibleEmployment.be  This test is not yet approved or cleared by the [place] FDA and has been authorized for detection and/or diagnosis of SARS-CoV-2 by FDA under an Emergency Use Authorization (EUA). This EUA will remain in effect (meaning this test can be used) for the duration of the COVID-19 declaration under Section 564(b)(1) of the Act, 21 U.S.C. section 360bbb-3(b)(1),  unless the authorization is terminated or revoked.  Performed at [facility], [address] [place], [place] [address]   Culture, blood (Routine X 2) w Reflex to ID Panel     Status: None (Preliminary result)   Collection Time: 09/27/20  1:41 AM  Specimen: BLOOD  Result Value Ref Range Status   Specimen Description BLOOD LEFT ANTECUBITAL  Final   Special Requests   Final    BOTTLES DRAWN AEROBIC AND ANAEROBIC Blood Culture results may not be optimal due to an excessive volume of blood received in culture bottles   Culture   Final    NO GROWTH < 12 HOURS Performed at [facility], [address], [place], [place] [address]    Report Status PENDING  Incomplete     Radiological Exams on Admission: CT ABDOMEN PELVIS WO CONTRAST  Result Date: 09/27/2020 CLINICAL DATA:  Nausea, vomiting, diarrhea. Recurrent C difficile colitis. EXAM: CT ABDOMEN AND PELVIS WITHOUT CONTRAST TECHNIQUE: Multidetector CT imaging of the abdomen and pelvis was performed following the standard protocol without IV contrast. COMPARISON:  09/01/2020. FINDINGS: Lower chest: Benign calcified granuloma within the left lung base. The visualized lung bases are otherwise clear. Pacemaker leads are seen within the right atrium and right ventricle. Global cardiac size is mildly enlarged. No pericardial effusion. Hepatobiliary: No focal liver abnormality is seen. No gallstones, gallbladder wall thickening, or biliary dilatation. Pancreas: Unremarkable Spleen: Unremarkable Adrenals/Urinary Tract: Adrenal glands are unremarkable. Kidneys are normal, without renal calculi, focal lesion, or hydronephrosis. Bladder is unremarkable. Stomach/Bowel: Extensive inflammatory changes involve the sigmoid flexure of the colon and descending colon with milder changes involving the sigmoid colon and ascending colon identified. These all appear improved since prior examination may simply represent the residua of the given history  of a pseudomembranous colitis. Extensive asymmetric inflammatory changes within the splenic flexure, however, also raise the question of a ischemic colitis given its watershed distribution. Moderate to severe pancolonic diverticulosis is again noted, most severe within the sigmoid colon. A complex supraumbilical ventral hernia is again identified with 4 separate hernia sacs containing multiple loops of [place] and small bowel. The stomach, small bowel, and Esters bowel are otherwise unremarkable. No evidence of obstruction. Appendix normal. No free intraperitoneal gas or fluid. Vascular/Lymphatic: Mild aortoiliac atherosclerotic calcification. No aortic aneurysm. No pathologic adenopathy within the abdomen and pelvis. Reproductive: Status post hysterectomy. No adnexal masses. Other: The rectum is unremarkable. Musculoskeletal: Degenerative changes are seen within the a lumbar spine. No lytic or blastic bone lesions are seen. IMPRESSION: Improving inflammatory changes involving the colon with residual inflammatory change identified involving the ascending colon and distal colon, most severe within the splenic flexure. While this most likely represents the residua of prior reported pseudomembranous colitis, ischemic colitis could be considered given the watershed distribution of the area of primary inflammation, particularly if there is been a history of significant hypotension. No evidence of a obstruction or perforation. Complex supraumbilical ventral hernia with at least 4 separate hernia sacs containing multiple loops of Krenz and small bowel. No evidence of obstruction. Pancolonic diverticulosis, severe within the sigmoid colon. Aortic Atherosclerosis (ICD10-I70.0). Electronically Signed   By: Fidela Salisbury MD   On: 09/27/2020 02:19     EKG: I have personally reviewed.  Sinus rhythm, QTC 450, LAE, poor R wave progression, PAC   Assessment/Plan Principal Problem:   C. difficile colitis Active Problems:    Atrial fibrillation (HCC)   Gout   Hypothyroidism   HTN (hypertension)   Hypokalemia  C. difficile colitis: CT scan showed colitis and also concerning for ischemic colitis.  Dr. Steva Ready of ID and Dr. Celine Ahr of surgery are consulted.  Patient has leukocytosis with WBC 14.9, mild fever 100.1, but no tachycardia or tachypnea.  Does not meets criteria for sepsis.  Lactic acid is 1.6.  -Admitted to MedSurg vaccine patient -Started with Ficaxomicin per Dr. Raelene Bott recommendation -As needed morphine and Percocet for pain -As needed Zofran for nausea -IV fluid: 1 L LR, then 75 cc/h  Atrial fibrillation (HCC) -Continue Coreg and Eliquis  Gout -Allopurinol and colchicine  Hypothyroidism -Synthroid  HTN (hypertension) -IV hydralazine as needed -Continue oral hydralazine -Hold Lasix and Cozaar since patient is at risk of developing sepsis and hypotension -Continue Coreg and hydralazine  Hypokalemia: Potassium 3.4 -Repleted potassium -Check magnesium level    DVT ppx: on Eliquis Code Status: Full code Family Communication: not done, no family member is at bed side.   Disposition Plan:  Anticipate discharge back to previous environment Consults called:   Dr. Zonia Kief of ID and Dr. Celine Ahr of surgery are consulted. Admission status and Level of care: Med-Surg:     as inpt       Status is: Inpatient  Remains inpatient appropriate because:Inpatient level of care appropriate due to severity of illness   Dispo: The patient is from: Home              Anticipated d/c is to: Home              Patient currently is not medically stable to d/c.   Difficult to place patient No          Date of Service 09/27/2020    [facility]   If 7PM-7AM, please contact night-coverage [email or web address] 09/27/2020, 5:25 PM

## 2020-09-27 NOTE — Consult Note (Signed)
Reason for Consult:? Ischemic colitis Referring Physician: Blaine Hamper, MD (hospital medicine)  [name] [name] is an 65 y.o. female.  HPI: She has a history of atrial fibrillation on Eliquis, hypothyroidism, hypertension, pacemaker for tachybradycardia syndrome with recent admission to the hospital for sepsis from C. difficile colitis who presents today with complaints of starting to feel poorly again on Thursday, June 2.  States she started having diarrhea the next day.  She called her primary care provider who started her on oral vancomycin 500 mg 4 times a day which she started at 4:30 PM on Friday the third.  States she has had multiple doses but symptoms are worsening.  She states today when she got up to go to the kitchen she felt she was going to pass out.  She has had decreased appetite and oral intake.  She started vomiting tonight.  She has had subjective fevers, chills and has felt achy.  No chest pain, shortness of breath or cough.  Reports previous hysterectomy.  Reports diffuse crampy abdominal pain.  On review of her records, patient was admitted to the hospital on 08/30/2020 and discharged on 09/11/2020 for septic shock secondary to severe C. difficile colitis, AKI.  Symptoms started after being treated at [facility] for right lower extremity cellulitis where she received IV vancomycin, clindamycin and was discharged home on Keflex.  CT scan (non-contrast) performed in the ED interpreted as follows:   IMPRESSION: Improving inflammatory changes involving the colon with residual inflammatory change identified involving the ascending colon and distal colon, most severe within the splenic flexure. While this most likely represents the residua of prior reported pseudomembranous colitis, ischemic colitis could be considered given the watershed distribution of the area of primary inflammation, particularly if there is been a history of significant hypotension. No evidence of a obstruction or  perforation.  Complex supraumbilical ventral hernia with at least 4 separate hernia sacs containing multiple loops of [place] and small bowel. No evidence of obstruction.  Pancolonic diverticulosis, severe within the sigmoid colon.  Aortic Atherosclerosis (ICD10-I70.0).  Lactic acid level was normal.  General surgery was consulted for further evaluation due to the question of ischemia reported on the CT interpretation.  Past Medical History:  Diagnosis Date  . Anemia   . Arrhythmia   . Atrial fibrillation ([place])   . Cancer ([place])   . Endometriosis   . GERD (gastroesophageal reflux disease)   . Gout   . Heart disease   . Hypothyroidism   . Obesity   . Prediabetes   . Renal disorder   . Ventricular hypertrophy     Past Surgical History:  Procedure Laterality Date  . ABDOMINAL HYSTERECTOMY    . ADENOIDECTOMY    . DILATION AND CURETTAGE OF UTERUS    . TONSILLECTOMY      Family History  Problem Relation Age of Onset  . Diabetes Mother   . Hypertension Mother   . Heart attack Father     Social History:  reports that she has never smoked. She has never used smokeless tobacco. She reports that she does not drink alcohol and does not use drugs.  Allergies:  Allergies  Allergen Reactions  . Amlodipine Other (See Comments)    dizziness  . Penicillins Itching  . Tape Rash    Medications: I have reviewed the patient's current medications.  Results for orders placed or performed during the hospital encounter of 09/27/20 (from the past 48 hour(s))  CBC with Differential/Platelet     Status:  Abnormal   Collection Time: 09/27/20  1:13 AM  Result Value Ref Range   WBC 14.9 (H) 4.0 - 10.5 K/uL   RBC 3.64 (L) 3.87 - 5.11 MIL/uL   Hemoglobin 12.2 12.0 - 15.0 g/dL   HCT 36.8 36.0 - 46.0 %   MCV 101.1 (H) 80.0 - 100.0 fL   MCH 33.5 26.0 - 34.0 pg   MCHC 33.2 30.0 - 36.0 g/dL   RDW 14.1 11.5 - 15.5 %   Platelets 234 150 - 400 K/uL   nRBC 0.0 0.0 - 0.2 %   Neutrophils  Relative % 80 %   Neutro Abs 12.0 (H) 1.7 - 7.7 K/uL   Lymphocytes Relative 7 %   Lymphs Abs 1.0 0.7 - 4.0 K/uL   Monocytes Relative 12 %   Monocytes Absolute 1.7 (H) 0.1 - 1.0 K/uL   Eosinophils Relative 0 %   Eosinophils Absolute 0.1 0.0 - 0.5 K/uL   Basophils Relative 0 %   Basophils Absolute 0.1 0.0 - 0.1 K/uL   Immature Granulocytes 1 %   Abs Immature Granulocytes 0.08 (H) 0.00 - 0.07 K/uL    Comment: Performed at [facility], [address] [place], [place] [address]  Comprehensive metabolic panel     Status: Abnormal   Collection Time: 09/27/20  1:13 AM  Result Value Ref Range   Sodium 136 135 - 145 mmol/L   Potassium 3.4 (L) 3.5 - 5.1 mmol/L   Chloride 102 98 - 111 mmol/L   CO2 25 22 - 32 mmol/L   Glucose, Bld 112 (H) 70 - 99 mg/dL    Comment: Glucose reference range applies only to samples taken after fasting for at least 8 hours.   BUN 16 8 - 23 mg/dL   Creatinine, Ser 0.77 0.44 - 1.00 mg/dL   Calcium 8.9 8.9 - 10.3 mg/dL   Total Protein 6.4 (L) 6.5 - 8.1 g/dL   Albumin 3.5 3.5 - 5.0 g/dL   AST 21 15 - 41 U/L   ALT 15 0 - 44 U/L   Alkaline Phosphatase 76 38 - 126 U/L   Total Bilirubin 0.9 0.3 - 1.2 mg/dL   GFR, Estimated >60 >60 mL/min    Comment: (NOTE) Calculated using the CKD-EPI Creatinine Equation (2021)    Anion gap 9 5 - 15    Comment: Performed at [facility], [address] [place], [place] [address]  Lipase, blood     Status: None   Collection Time: 09/27/20  1:13 AM  Result Value Ref Range   Lipase 27 11 - 51 U/L    Comment: Performed at [facility], [address] [place], [place] [address]  Lactic acid, plasma     Status: None   Collection Time: 09/27/20  1:34 AM  Result Value Ref Range   Lactic Acid, Venous 1.6 0.5 - 1.9 mmol/L    Comment: Performed at [facility], [address] [place], [place] [address]  Culture, blood (Routine X 2) w Reflex to ID Panel     Status: None (Preliminary result)    Collection Time: 09/27/20  1:34 AM   Specimen: BLOOD  Result Value Ref Range   Specimen Description BLOOD RIGHT ANTECUBITAL    Special Requests      BOTTLES DRAWN AEROBIC AND ANAEROBIC Blood Culture adequate volume   Culture      NO GROWTH < 12 HOURS Performed at [facility], [address], [place], [place] [address]  Report Status PENDING   Gastrointestinal Panel by PCR , Stool     Status: None   Collection Time: 09/27/20  1:34 AM   Specimen: Stool  Result Value Ref Range   Campylobacter species NOT DETECTED NOT DETECTED   Plesimonas shigelloides NOT DETECTED NOT DETECTED   Salmonella species NOT DETECTED NOT DETECTED   Yersinia enterocolitica NOT DETECTED NOT DETECTED   Vibrio species NOT DETECTED NOT DETECTED   Vibrio cholerae NOT DETECTED NOT DETECTED   Enteroaggregative E coli (EAEC) NOT DETECTED NOT DETECTED   Enteropathogenic E coli (EPEC) NOT DETECTED NOT DETECTED   Enterotoxigenic E coli (ETEC) NOT DETECTED NOT DETECTED   Shiga like toxin producing E coli (STEC) NOT DETECTED NOT DETECTED   Shigella/Enteroinvasive E coli (EIEC) NOT DETECTED NOT DETECTED   Cryptosporidium NOT DETECTED NOT DETECTED   Cyclospora cayetanensis NOT DETECTED NOT DETECTED   Entamoeba histolytica NOT DETECTED NOT DETECTED   Giardia lamblia NOT DETECTED NOT DETECTED   Adenovirus F40/41 NOT DETECTED NOT DETECTED   Astrovirus NOT DETECTED NOT DETECTED   Norovirus GI/GII NOT DETECTED NOT DETECTED   Rotavirus A NOT DETECTED NOT DETECTED   Sapovirus (I, II, IV, and V) NOT DETECTED NOT DETECTED    Comment: Performed at [facility], [address] [place], [place] [address]  C Difficile Quick Screen w PCR reflex     Status: Abnormal   Collection Time: 09/27/20  1:34 AM   Specimen: Stool  Result Value Ref Range   C Diff antigen POSITIVE (A) NEGATIVE   C Diff toxin POSITIVE (A) NEGATIVE   C Diff interpretation Toxin producing C. difficile detected.     Comment: RESULT CALLED  TO, READ BACK BY AND VERIFIED WITH: AUSTIN REEVES 09/27/20 0606 LFD Performed at [facility], [address] [place], [place] [address]   Resp Panel by RT-PCR (Flu A&B, Covid) Nasopharyngeal Swab     Status: None   Collection Time: 09/27/20  1:34 AM   Specimen: Nasopharyngeal Swab; Nasopharyngeal(NP) swabs in vial transport medium  Result Value Ref Range   SARS Coronavirus 2 by RT PCR NEGATIVE NEGATIVE    Comment: (NOTE) SARS-CoV-2 target nucleic acids are NOT DETECTED.  The SARS-CoV-2 RNA is generally detectable in upper respiratory specimens during the acute phase of infection. The lowest concentration of SARS-CoV-2 viral copies this assay can detect is 138 copies/mL. A negative result does not preclude SARS-Cov-2 infection and should not be used as the sole basis for treatment or other patient management decisions. A negative result may occur with  improper specimen collection/handling, submission of specimen other than nasopharyngeal swab, presence of viral mutation(s) within the areas targeted by this assay, and inadequate number of viral copies(<138 copies/mL). A negative result must be combined with clinical observations, patient history, and epidemiological information. The expected result is Negative.  Fact Sheet for Patients:  EntrepreneurPulse.com.au  Fact Sheet for Healthcare Providers:  IncredibleEmployment.be  This test is no t yet approved or cleared by the [place] FDA and  has been authorized for detection and/or diagnosis of SARS-CoV-2 by FDA under an Emergency Use Authorization (EUA). This EUA will remain  in effect (meaning this test can be used) for the duration of the COVID-19 declaration under Section 564(b)(1) of the Act, 21 U.S.C.section 360bbb-3(b)(1), unless the authorization is terminated  or revoked sooner.       Influenza A by PCR NEGATIVE NEGATIVE   Influenza B by PCR NEGATIVE NEGATIVE     Comment: (NOTE) The  Xpert Xpress SARS-CoV-2/FLU/RSV plus assay is intended as an aid in the diagnosis of influenza from Nasopharyngeal swab specimens and should not be used as a sole basis for treatment. Nasal washings and aspirates are unacceptable for Xpert Xpress SARS-CoV-2/FLU/RSV testing.  Fact Sheet for Patients: EntrepreneurPulse.com.au  Fact Sheet for Healthcare Providers: IncredibleEmployment.be  This test is not yet approved or cleared by the [place] FDA and has been authorized for detection and/or diagnosis of SARS-CoV-2 by FDA under an Emergency Use Authorization (EUA). This EUA will remain in effect (meaning this test can be used) for the duration of the COVID-19 declaration under Section 564(b)(1) of the Act, 21 U.S.C. section 360bbb-3(b)(1), unless the authorization is terminated or revoked.  Performed at [facility], [address] [place], [place] [address]   Culture, blood (Routine X 2) w Reflex to ID Panel     Status: None (Preliminary result)   Collection Time: 09/27/20  1:41 AM   Specimen: BLOOD  Result Value Ref Range   Specimen Description BLOOD LEFT ANTECUBITAL    Special Requests      BOTTLES DRAWN AEROBIC AND ANAEROBIC Blood Culture results may not be optimal due to an excessive volume of blood received in culture bottles   Culture      NO GROWTH < 12 HOURS Performed at [facility], [address] [place],  [address]    Report Status PENDING     CT ABDOMEN PELVIS WO CONTRAST  Result Date: 09/27/2020 CLINICAL DATA:  Nausea, vomiting, diarrhea. Recurrent C difficile colitis. EXAM: CT ABDOMEN AND PELVIS WITHOUT CONTRAST TECHNIQUE: Multidetector CT imaging of the abdomen and pelvis was performed following the standard protocol without IV contrast. COMPARISON:  09/01/2020. FINDINGS: Lower chest: Benign calcified granuloma within the left lung base. The visualized lung bases are  otherwise clear. Pacemaker leads are seen within the right atrium and right ventricle. Global cardiac size is mildly enlarged. No pericardial effusion. Hepatobiliary: No focal liver abnormality is seen. No gallstones, gallbladder wall thickening, or biliary dilatation. Pancreas: Unremarkable Spleen: Unremarkable Adrenals/Urinary Tract: Adrenal glands are unremarkable. Kidneys are normal, without renal calculi, focal lesion, or hydronephrosis. Bladder is unremarkable. Stomach/Bowel: Extensive inflammatory changes involve the sigmoid flexure of the colon and descending colon with milder changes involving the sigmoid colon and ascending colon identified. These all appear improved since prior examination may simply represent the residua of the given history of a pseudomembranous colitis. Extensive asymmetric inflammatory changes within the splenic flexure, however, also raise the question of a ischemic colitis given its watershed distribution. Moderate to severe pancolonic diverticulosis is again noted, most severe within the sigmoid colon. A complex supraumbilical ventral hernia is again identified with 4 separate hernia sacs containing multiple loops of [place] and small bowel. The stomach, small bowel, and [place] bowel are otherwise unremarkable. No evidence of obstruction. Appendix normal. No free intraperitoneal gas or fluid. Vascular/Lymphatic: Mild aortoiliac atherosclerotic calcification. No aortic aneurysm. No pathologic adenopathy within the abdomen and pelvis. Reproductive: Status post hysterectomy. No adnexal masses. Other: The rectum is unremarkable. Musculoskeletal: Degenerative changes are seen within the a lumbar spine. No lytic or blastic bone lesions are seen. IMPRESSION: Improving inflammatory changes involving the colon with residual inflammatory change identified involving the ascending colon and distal colon, most severe within the splenic flexure. While this most likely represents the residua of  prior reported pseudomembranous colitis, ischemic colitis could be considered given the watershed distribution of the area of primary inflammation, particularly if there is been a history  of significant hypotension. No evidence of a obstruction or perforation. Complex supraumbilical ventral hernia with at least 4 separate hernia sacs containing multiple loops of [place] and small bowel. No evidence of obstruction. Pancolonic diverticulosis, severe within the sigmoid colon. Aortic Atherosclerosis (ICD10-I70.0). Electronically Signed   By: Fidela Salisbury MD   On: 09/27/2020 02:19    Review of Systems  Constitutional: Positive for appetite change, chills and fever.  Gastrointestinal: Positive for diarrhea, nausea and vomiting.  and as per the HPI  Blood pressure 136/62, pulse 67, temperature 98.8 F (37.1 C), temperature source Oral, resp. rate 17, height 5\' 8"  (1.727 m), weight 110.2 kg, SpO2 95 %. Physical Exam Constitutional:      General: She is not in acute distress.    Appearance: She is obese.  HENT:     Head: Normocephalic and atraumatic.     Mouth/Throat:     Mouth: Mucous membranes are moist.  Eyes:     General: No scleral icterus.       Right eye: No discharge.        Left eye: No discharge.     Conjunctiva/sclera: Conjunctivae normal.  Cardiovascular:     Rate and Rhythm: Normal rate.  Abdominal:     Palpations: Abdomen is soft.     Tenderness: There is no abdominal tenderness. There is no guarding or rebound.     Hernia: A hernia is present.     Comments: Protuberant, consistent with her level of obesity. Multiple fascial defects without concern for incarceration/strangulation.  Musculoskeletal:     Right lower leg: Edema present.     Left lower leg: Edema present.     Comments: Venous stasis skin changes bilaterally  Skin:    General: Skin is warm and dry.  Neurological:     General: No focal deficit present.     Mental Status: She is alert and oriented to person,  place, and time.  Psychiatric:        Mood and Affect: Mood normal.        Behavior: Behavior normal.     Assessment/Plan: This is a 65 year old woman who was recently hospitalized for 2 weeks with sepsis secondary to C. difficile colitis.  She subsequently failed ongoing outpatient treatment and presented to the emergency department due to her concerns for worsening disease and the fact that she had had severe acute kidney injury on her previous hospitalization.  CT scan obtained in the emergency department suggested improvement overall but raised the question as to whether there was a possible ischemic component at the splenic flexure.  No toxic megacolon identified.  On physical examination today, she is nontender and certainly does not have pain out of proportion to exam.  Lactic acid is normal, which also argues against the possibility of ischemia.  I think the CT findings most likely represent sequelae of incompletely treated C. difficile and not ischemia.  No need for surgical intervention.  Fredirick Maudlin 09/27/2020, 12:13 PM

## 2020-09-27 NOTE — ED Notes (Signed)
EDP Ward at bedside

## 2020-09-27 NOTE — ED Triage Notes (Signed)
Pt presents to ER via ems c/o n/v/d since starting vancomycin on Friday afternoon for tx of recurrent c-diff.  Pt states she was released from hospital 2 weeks ago after being treated w/iv vanc.  Pt states she also had fever at home of 101 and took 1 gram tylenol 6 hrs ago.  Pt A&O x4 with no acute distress noted.

## 2020-09-27 NOTE — ED Provider Notes (Signed)
[facility] Emergency Department Provider Note  ____________________________________________   Event Date/Time   First MD Initiated Contact with Patient 09/27/20 [phone]     (approximate)  I have reviewed the triage vital signs and the nursing notes.   HISTORY  Chief Complaint Emesis, Nausea, and Diarrhea    HPI [name] [name] is a 65 y.o. female with history of atrial fibrillation on Eliquis, hypothyroidism, hypertension, pacemaker for tachybradycardia syndrome with recent admission to the hospital for sepsis from C. difficile colitis who presents today with complaints of starting to feel poorly again on Thursday, June 2.  States she started having diarrhea the next day.  She called her primary care provider who started her on oral vancomycin 500 mg 4 times a day which she started at 4:30 PM on Friday the third.  States she has had multiple doses but symptoms are worsening.  She states today when she got up to go to the kitchen she felt she was going to pass out.  She has had decreased appetite and oral intake.  She started vomiting tonight.  She has had subjective fevers, chills and has felt achy.  No chest pain, shortness of breath or cough.  Reports previous hysterectomy.  Reports diffuse crampy abdominal pain.  On review of her records, patient was admitted to the hospital on 08/30/2020 and discharged on 09/11/2020 for septic shock secondary to severe C. difficile colitis, AKI.  Symptoms started after being treated at [facility] for right lower extremity cellulitis where she received IV vancomycin, clindamycin and was discharged home on Keflex.     Past Medical History:  Diagnosis Date  . Anemia   . Arrhythmia   . Atrial fibrillation ([place])   . Cancer ([place])   . Endometriosis   . GERD (gastroesophageal reflux disease)   . Gout   . Heart disease   . Hypothyroidism   . Obesity   . Prediabetes   . Renal disorder   . Ventricular hypertrophy     Patient  Active Problem List   Diagnosis Date Noted  . C. difficile colitis 09/27/2020  . Septic shock ([place]) 08/31/2020    Past Surgical History:  Procedure Laterality Date  . ABDOMINAL HYSTERECTOMY    . ADENOIDECTOMY    . DILATION AND CURETTAGE OF UTERUS    . TONSILLECTOMY      Prior to Admission medications   Medication Sig Start Date End Date Taking? Authorizing Provider  allopurinol (ZYLOPRIM) 100 MG tablet Take 100 mg by mouth 2 (two) times daily.    [provider]  carvedilol (COREG) 12.5 MG tablet Take 1 tablet (12.5 mg total) by mouth 2 (two) times daily. 09/11/20 12/10/20  Terrilee Croak, MD  colchicine 0.6 MG tablet Take 0.6 mg by mouth as needed.    [provider]  ELIQUIS 5 MG TABS tablet Take 1 tablet by mouth 2 (two) times daily. Patient not taking: No sig reported 08/08/20   [provider]  flecainide (TAMBOCOR) 100 MG tablet Take 100 mg by mouth 2 (two) times daily. 08/13/20   [provider]  furosemide (LASIX) 20 MG tablet Take 1 tablet (20 mg total) by mouth daily. 09/11/20 12/10/20  Terrilee Croak, MD  hydrALAZINE (APRESOLINE) 25 MG tablet Take 1 tablet (25 mg total) by mouth 2 (two) times daily. 09/11/20 12/10/20  Terrilee Croak, MD  levothyroxine (SYNTHROID, LEVOTHROID) 175 MCG tablet Take 175 mcg by mouth daily before breakfast.    [provider]  losartan (  COZAAR) 100 MG tablet Take 1 tablet (100 mg total) by mouth daily. 09/11/20 12/10/20  Terrilee Croak, MD  oxyCODONE-acetaminophen (PERCOCET/ROXICET) 5-325 MG tablet Take 1 tablet by mouth every 8 (eight) hours as needed. 08/17/20   [provider]    Allergies Amlodipine, Penicillins, and Tape  Family History  Problem Relation Age of Onset  . Diabetes Mother   . Hypertension Mother   . Heart attack Father     Social History Social History   Tobacco Use  . Smoking status: Never Smoker  . Smokeless tobacco: Never Used  Vaping Use  . Vaping Use: Never used   Substance Use Topics  . Alcohol use: No  . Drug use: No    Review of Systems Constitutional: + fever. Eyes: No visual changes. ENT: No sore throat. Cardiovascular: Denies chest pain. Respiratory: Denies shortness of breath. Gastrointestinal: + nausea, vomiting, diarrhea. Genitourinary: Negative for dysuria. Musculoskeletal: Negative for back pain. Skin: Negative for rash. Neurological: Negative for focal weakness or numbness.  ____________________________________________   PHYSICAL EXAM:  VITAL SIGNS: ED Triage Vitals [09/27/20 0013]  Enc Vitals Group     BP (!) 170/71     Pulse Rate 64     Resp 18     Temp 100.1 F (37.8 C)     Temp Source Oral     SpO2 96 %     Weight 243 lb (110.2 kg)     Height 5\' 8"  (1.727 m)     Head Circumference      Peak Flow      Pain Score 2     Pain Loc      Pain Edu?      Excl. in [place]?    CONSTITUTIONAL: Alert and oriented and responds appropriately to questions. Well-appearing; well-nourished, obese, temperature of 100.1 orally HEAD: Normocephalic EYES: Conjunctivae clear, pupils appear equal, EOM appear intact ENT: normal nose; moist mucous membranes NECK: Supple, normal ROM CARD: RRR; S1 and S2 appreciated; no murmurs, no clicks, no rubs, no gallops RESP: Normal chest excursion without splinting or tachypnea; breath sounds clear and equal bilaterally; no wheezes, no rhonchi, no rales, no hypoxia or respiratory distress, speaking full sentences ABD/GI: Normal bowel sounds; non-distended; soft, diffusely tender to palpation without guarding or rebound BACK: The back appears normal EXT: Normal ROM in all joints; no deformity noted, no edema; no cyanosis SKIN: Normal color for age and race; warm; no rash on exposed skin NEURO: Moves all extremities equally PSYCH: The patient's mood and manner are appropriate.  ____________________________________________   LABS (all labs ordered are listed, but only abnormal results are  displayed)  Labs Reviewed  C DIFFICILE QUICK SCREEN W PCR REFLEX - Abnormal; Notable for the following components:      Result Value   C Diff antigen POSITIVE (*)    C Diff toxin POSITIVE (*)    All other components within normal limits  CBC WITH DIFFERENTIAL/PLATELET - Abnormal; Notable for the following components:   WBC 14.9 (*)    RBC 3.64 (*)    MCV 101.1 (*)    Neutro Abs 12.0 (*)    Monocytes Absolute 1.7 (*)    Abs Immature Granulocytes 0.08 (*)    All other components within normal limits  COMPREHENSIVE METABOLIC PANEL - Abnormal; Notable for the following components:   Potassium 3.4 (*)    Glucose, Bld 112 (*)    Total Protein 6.4 (*)    All other components within normal limits  GASTROINTESTINAL  PANEL BY PCR, STOOL (REPLACES STOOL CULTURE)  RESP PANEL BY RT-PCR (FLU A&B, COVID) ARPGX2  CULTURE, BLOOD (ROUTINE X 2)  CULTURE, BLOOD (ROUTINE X 2)  URINE CULTURE  LIPASE, BLOOD  LACTIC ACID, PLASMA  URINALYSIS, COMPLETE (UACMP) WITH MICROSCOPIC   ____________________________________________  EKG   EKG Interpretation  Date/Time:  Sunday September 27 2020 02:08:43 EDT Ventricular Rate:  70 PR Interval:  168 QRS Duration: 100 QT Interval:  417 QTC Calculation: 450 R Axis:   -14 Text Interpretation: Sinus rhythm Probable left atrial enlargement Left ventricular hypertrophy Confirmed by Pryor Curia [phone]) on 09/27/2020 2:22:49 AM       ____________________________________________  RADIOLOGY Jessie Foot Dandria Griego, personally viewed and evaluated these images (plain radiographs) as part of my medical decision making, as well as reviewing the written report by the radiologist.  ED MD interpretation: Improving colitis.  Official radiology report(s): CT ABDOMEN PELVIS WO CONTRAST  Result Date: 09/27/2020 CLINICAL DATA:  Nausea, vomiting, diarrhea. Recurrent C difficile colitis. EXAM: CT ABDOMEN AND PELVIS WITHOUT CONTRAST TECHNIQUE: Multidetector CT imaging of the abdomen  and pelvis was performed following the standard protocol without IV contrast. COMPARISON:  09/01/2020. FINDINGS: Lower chest: Benign calcified granuloma within the left lung base. The visualized lung bases are otherwise clear. Pacemaker leads are seen within the right atrium and right ventricle. Global cardiac size is mildly enlarged. No pericardial effusion. Hepatobiliary: No focal liver abnormality is seen. No gallstones, gallbladder wall thickening, or biliary dilatation. Pancreas: Unremarkable Spleen: Unremarkable Adrenals/Urinary Tract: Adrenal glands are unremarkable. Kidneys are normal, without renal calculi, focal lesion, or hydronephrosis. Bladder is unremarkable. Stomach/Bowel: Extensive inflammatory changes involve the sigmoid flexure of the colon and descending colon with milder changes involving the sigmoid colon and ascending colon identified. These all appear improved since prior examination may simply represent the residua of the given history of a pseudomembranous colitis. Extensive asymmetric inflammatory changes within the splenic flexure, however, also raise the question of a ischemic colitis given its watershed distribution. Moderate to severe pancolonic diverticulosis is again noted, most severe within the sigmoid colon. A complex supraumbilical ventral hernia is again identified with 4 separate hernia sacs containing multiple loops of Krutz and small bowel. The stomach, small bowel, and Quale bowel are otherwise unremarkable. No evidence of obstruction. Appendix normal. No free intraperitoneal gas or fluid. Vascular/Lymphatic: Mild aortoiliac atherosclerotic calcification. No aortic aneurysm. No pathologic adenopathy within the abdomen and pelvis. Reproductive: Status post hysterectomy. No adnexal masses. Other: The rectum is unremarkable. Musculoskeletal: Degenerative changes are seen within the a lumbar spine. No lytic or blastic bone lesions are seen. IMPRESSION: Improving inflammatory  changes involving the colon with residual inflammatory change identified involving the ascending colon and distal colon, most severe within the splenic flexure. While this most likely represents the residua of prior reported pseudomembranous colitis, ischemic colitis could be considered given the watershed distribution of the area of primary inflammation, particularly if there is been a history of significant hypotension. No evidence of a obstruction or perforation. Complex supraumbilical ventral hernia with at least 4 separate hernia sacs containing multiple loops of Mihalko and small bowel. No evidence of obstruction. Pancolonic diverticulosis, severe within the sigmoid colon. Aortic Atherosclerosis (ICD10-I70.0). Electronically Signed   By: Fidela Salisbury MD   On: 09/27/2020 02:19    ____________________________________________   PROCEDURES  Procedure(s) performed (including Critical Care):  Procedures    ____________________________________________   INITIAL IMPRESSION / ASSESSMENT AND PLAN / ED COURSE  As part of my medical decision making,  I reviewed the following data within the [facility] notes reviewed and incorporated, Labs reviewed , Old chart reviewed, Radiograph reviewed , Discussed with admitting physician  and Notes from prior ED visits         Patient here with concerns for recurrent C. difficile colitis.  States that she has felt very lightheaded like she was going to pass out today.  She has had a prolonged admission for septic shock due to C. difficile colitis.  Will obtain labs, cultures, urine, CT of the abdomen pelvis.  Will give IV fluids, pain and nausea medicine.  We will start IV Flagyl.  ED PROGRESS  Patient's WBC elevated at 14.  Lactic normal.  C diff positive.  CTAP shows improving colitis compared to prior.  Patient concerned that she is not improving with oral antibiotic therapy at home.  She does not feel comfortable with plan for  discharge home and would prefer admission.  We will continue IV Flagyl and IV fluids and discussed with hospitalist for admission.  Not currently septic.  Continues to be hemodynamically stable.  4:36 AM  Discussed patient's case with hospitalist, Dr. Sidney Ace.  I have recommended admission and patient (and family if present) agree with this plan. Admitting physician will place admission orders.   I reviewed all nursing notes, vitals, pertinent previous records and reviewed/interpreted all EKGs, lab and urine results, imaging (as available).   ____________________________________________   FINAL CLINICAL IMPRESSION(S) / ED DIAGNOSES  Final diagnoses:  Nausea vomiting and diarrhea  C. difficile colitis     ED Discharge Orders    None      *Please note:  [name] [name] was evaluated in Emergency Department on 09/27/2020 for the symptoms described in the history of present illness. She was evaluated in the context of the global COVID-19 pandemic, which necessitated consideration that the patient might be at risk for infection with the SARS-CoV-2 virus that causes COVID-19. Institutional protocols and algorithms that pertain to the evaluation of patients at risk for COVID-19 are in a state of rapid change based on information released by regulatory bodies including the CDC and federal and state organizations. These policies and algorithms were followed during the patient's care in the ED.  Some ED evaluations and interventions may be delayed as a result of limited staffing during and the pandemic.*   Note:  This document was prepared using Dragon voice recognition software and may include unintentional dictation errors.   Jidenna Figgs, Delice Bison, DO 09/27/20 [phone]

## 2020-09-28 ENCOUNTER — Other Ambulatory Visit (HOSPITAL_COMMUNITY): Payer: Self-pay

## 2020-09-28 DIAGNOSIS — I10 Essential (primary) hypertension: Secondary | ICD-10-CM

## 2020-09-28 DIAGNOSIS — E039 Hypothyroidism, unspecified: Secondary | ICD-10-CM

## 2020-09-28 DIAGNOSIS — A0472 Enterocolitis due to Clostridium difficile, not specified as recurrent: Secondary | ICD-10-CM

## 2020-09-28 DIAGNOSIS — W19XXXA Unspecified fall, initial encounter: Secondary | ICD-10-CM

## 2020-09-28 DIAGNOSIS — I4891 Unspecified atrial fibrillation: Secondary | ICD-10-CM

## 2020-09-28 LAB — BASIC METABOLIC PANEL
Anion gap: 7 (ref 5–15)
BUN: 15 mg/dL (ref 8–23)
CO2: 27 mmol/L (ref 22–32)
Calcium: 8.5 mg/dL — ABNORMAL LOW (ref 8.9–10.3)
Chloride: 104 mmol/L (ref 98–111)
Creatinine, Ser: 0.83 mg/dL (ref 0.44–1.00)
GFR, Estimated: >60 mL/min (ref >60)
Glucose, Bld: 92 mg/dL (ref 70–99)
Potassium: 3.7 mmol/L (ref 3.5–5.1)
Sodium: 138 mmol/L (ref 135–145)

## 2020-09-28 LAB — CBC
HCT: 33.1 % — ABNORMAL LOW (ref 36.0–46.0)
Hemoglobin: 11 g/dL — ABNORMAL LOW (ref 12.0–15.0)
MCH: 33.5 pg (ref 26.0–34.0)
MCHC: 33.2 g/dL (ref 30.0–36.0)
MCV: 100.9 fL — ABNORMAL HIGH (ref 80.0–100.0)
Platelets: 187 10*3/uL (ref 150–400)
RBC: 3.28 MIL/uL — ABNORMAL LOW (ref 3.87–5.11)
RDW: 14.1 % (ref 11.5–15.5)
WBC: 8 10*3/uL (ref 4.0–10.5)
nRBC: 0 % (ref 0.0–0.2)

## 2020-09-28 MED ORDER — POTASSIUM CHLORIDE CRYS ER 20 MEQ PO TBCR
40.0000 meq | EXTENDED_RELEASE_TABLET | Freq: Once | ORAL | Status: AC
  Administered 2020-09-28: 12:00:00 40 meq via ORAL
  Filled 2020-09-28: qty 2

## 2020-09-28 MED ORDER — MAGNESIUM SULFATE 2 GM/50ML IV SOLN
2.0000 g | Freq: Once | INTRAVENOUS | Status: AC
  Administered 2020-09-28: 16:00:00 2 g via INTRAVENOUS
  Filled 2020-09-28: qty 50

## 2020-09-28 MED ORDER — MAGNESIUM SULFATE 2 GM/50ML IV SOLN
2.0000 g | Freq: Once | INTRAVENOUS | Status: AC
  Administered 2020-09-28: 10:00:00 2 g via INTRAVENOUS
  Filled 2020-09-28: qty 50

## 2020-09-28 NOTE — Progress Notes (Signed)
PROGRESS NOTE    [name] [name]  BSJ:[id number] DOB: 1955/08/18 DOA: 09/27/2020 PCP: Barbaraann Boys, MD   Brief Narrative: 59 with past medical history significant for hypertension, prediabetes, GERD, hypothyroidism, gout, tachycardia syndrome, status post pacemaker, A. fib on Eliquis, endometriosis, C. difficile colitis, obesity who presents complaining of abdominal pain, nausea vomiting and diarrhea.  Of note patient was recently hospitalized to ICU due to septic shock secondary to C. difficile colitis from 5/81 till 5/20.  Discharged on oral vancomycin, completed treatment.  She presented with worsening abdominal pain, diarrhea 3 days prior to this admission.  She also reported multiple episode of vomiting.  She was restarted on oral vancomycin on Friday without significant improvement.  Evaluation in the ED: Patient was found to have leukocytosis white blood cell 14.9, C  Difficile positive for antigen and toxin, temperature 100.1. CT abdomen and pelvis: Improving inflammatory changes involving the colon with residual inflammatory changes identified involving the ascending colon and distal colon, most severe within the splenic flexure.  While this most likely represented a series of prior reported pseudomembranous colitis, ischemic colitis could be considered given the watershed distribution of the area of primary inflammation, particularly if there is been a history of significant hypotension.No evidence of a obstruction or perforation. Complex supraumbilical ventral hernia with at least 4 separate hernia sacs containing multiple loops of [place] and small bowel. No evidence of obstruction.    Assessment & Plan:   Principal Problem:   C. difficile colitis Active Problems:   Atrial fibrillation ([place])   Gout   Hypothyroidism   HTN (hypertension)   Hypokalemia  1-Recurrent C. difficile Colitis: C. difficile colitis and also findings concerning for ischemic colitis. She was evaluated by  general surgeon Dr. Celine Ahr, who recommend no surgical intervention at this time. C diff  toxin and antigen positive Patient was evaluated by Dr. Steva Ready, who recommended Fidaxomicin (Dificid)/  Day 2 Fidaxomicin.  She report multiple episode of diarrhea yesterday.  She had diarrhea overnight. Received IV fluids.   A fib:  Continue with Coreg, eliquis, Flecainide.    Hypomagnesemia;  Replete IV with 4 gm Magnesium.    Hypothyroidism: Continue with Synthroid  Gout: Continue with allopurinol and as needed colchicine  Hypertension: Continue with hydralazine Cozaar and Coreg. Currently holding Lasix.  Hypokalemia; replaced.   Macrocytosis. Check B12.   Estimated body mass index is 36.95 kg/m as calculated from the following:   Height as of this encounter: 5\' 8"  (1.727 m).   Weight as of this encounter: 110.2 kg.   DVT prophylaxis: Eliquis Code Status: Full Code Family Communication: Care discussed with patient.  Disposition Plan:  Status is: Inpatient  Remains inpatient appropriate because:Inpatient level of care appropriate due to severity of illness   Dispo: The patient is from: Home              Anticipated d/c is to: Home              Patient currently is not medically stable to d/c.   Difficult to place patient No        Consultants:   ID  Surgery   Procedures:   None  Antimicrobials:  Dificid.    Subjective: She report multiples episodes of watery diarrhea yesterday and abdominal pain. BM last night started to have some consistency.  She has not had BM this am. She hasn't been eating much    Objective: Vitals:   09/27/20 1112 09/27/20 1557 09/27/20 2102 09/28/20 7654  BP: 136/62 (!) 157/82 (!) 148/69 (!) 153/79  Pulse: 67 77 63 63  Resp: 17 17 16 16   Temp: 98.8 F (37.1 C) 99.4 F (37.4 C) 98.7 F (37.1 C) 98.3 F (36.8 C)  TempSrc: Oral Oral Oral   SpO2: 95% 92% 93% 95%  Weight:      Height:       No intake or output data in  the 24 hours ending 09/28/20 0758 Filed Weights   09/27/20 0013  Weight: 110.2 kg    Examination:  General exam: Appears calm and comfortable  Respiratory system: Clear to auscultation. Respiratory effort normal. Cardiovascular system: S1 & S2 heard, IRR Gastrointestinal system: Abdomen is nondistended, soft and nontender. No organomegaly or masses felt. Normal bowel sounds heard. Central nervous system: Alert and oriented. No focal neurological deficits. Extremities: Symmetric 5 x 5 power.    Data Reviewed: I have personally reviewed following labs and imaging studies  CBC: Recent Labs  Lab 09/27/20 0113 09/28/20 0632  WBC 14.9* 8.0  NEUTROABS 12.0*  --   HGB 12.2 11.0*  HCT 36.8 33.1*  MCV 101.1* 100.9*  PLT 234 680   Basic Metabolic Panel: Recent Labs  Lab 09/27/20 0113 09/27/20 1153 09/28/20 0632  NA 136  --  138  K 3.4*  --  3.7  CL 102  --  104  CO2 25  --  27  GLUCOSE 112*  --  92  BUN 16  --  15  CREATININE 0.77  --  0.83  CALCIUM 8.9  --  8.5*  MG  --  1.6*  --    GFR: Estimated Creatinine Clearance: 89.1 mL/min (by C-G formula based on SCr of 0.83 mg/dL). Liver Function Tests: Recent Labs  Lab 09/27/20 0113  AST 21  ALT 15  ALKPHOS 76  BILITOT 0.9  PROT 6.4*  ALBUMIN 3.5   Recent Labs  Lab 09/27/20 0113  LIPASE 27   No results for input(s): AMMONIA in the last 168 hours. Coagulation Profile: Recent Labs  Lab 09/27/20 1153  INR 1.2   Cardiac Enzymes: No results for input(s): CKTOTAL, CKMB, CKMBINDEX, TROPONINI in the last 168 hours. BNP (last 3 results) No results for input(s): PROBNP in the last 8760 hours. HbA1C: No results for input(s): HGBA1C in the last 72 hours. CBG: No results for input(s): GLUCAP in the last 168 hours. Lipid Profile: No results for input(s): CHOL, HDL, LDLCALC, TRIG, CHOLHDL, LDLDIRECT in the last 72 hours. Thyroid Function Tests: No results for input(s): TSH, T4TOTAL, FREET4, T3FREE, THYROIDAB in the  last 72 hours. Anemia Panel: No results for input(s): VITAMINB12, FOLATE, FERRITIN, TIBC, IRON, RETICCTPCT in the last 72 hours. Sepsis Labs: Recent Labs  Lab 09/27/20 0134  LATICACIDVEN 1.6    Recent Results (from the past 240 hour(s))  Culture, blood (Routine X 2) w Reflex to ID Panel     Status: None (Preliminary result)   Collection Time: 09/27/20  1:34 AM   Specimen: BLOOD  Result Value Ref Range Status   Specimen Description BLOOD RIGHT ANTECUBITAL  Final   Special Requests   Final    BOTTLES DRAWN AEROBIC AND ANAEROBIC Blood Culture adequate volume   Culture   Final    NO GROWTH < 12 HOURS Performed at [facility], [address], [place], [place] [address]    Report Status PENDING  Incomplete  Gastrointestinal Panel by PCR , Stool     Status: None   Collection Time: 09/27/20  1:34  AM   Specimen: Stool  Result Value Ref Range Status   Campylobacter species NOT DETECTED NOT DETECTED Final   Plesimonas shigelloides NOT DETECTED NOT DETECTED Final   Salmonella species NOT DETECTED NOT DETECTED Final   Yersinia enterocolitica NOT DETECTED NOT DETECTED Final   Vibrio species NOT DETECTED NOT DETECTED Final   Vibrio cholerae NOT DETECTED NOT DETECTED Final   Enteroaggregative E coli (EAEC) NOT DETECTED NOT DETECTED Final   Enteropathogenic E coli (EPEC) NOT DETECTED NOT DETECTED Final   Enterotoxigenic E coli (ETEC) NOT DETECTED NOT DETECTED Final   Shiga like toxin producing E coli (STEC) NOT DETECTED NOT DETECTED Final   Shigella/Enteroinvasive E coli (EIEC) NOT DETECTED NOT DETECTED Final   Cryptosporidium NOT DETECTED NOT DETECTED Final   Cyclospora cayetanensis NOT DETECTED NOT DETECTED Final   Entamoeba histolytica NOT DETECTED NOT DETECTED Final   Giardia lamblia NOT DETECTED NOT DETECTED Final   Adenovirus F40/41 NOT DETECTED NOT DETECTED Final   Astrovirus NOT DETECTED NOT DETECTED Final   Norovirus GI/GII NOT DETECTED NOT DETECTED Final   Rotavirus A  NOT DETECTED NOT DETECTED Final   Sapovirus (I, II, IV, and V) NOT DETECTED NOT DETECTED Final    Comment: Performed at [facility], [address] [place], [place] [address]  C Difficile Quick Screen w PCR reflex     Status: Abnormal   Collection Time: 09/27/20  1:34 AM   Specimen: Stool  Result Value Ref Range Status   C Diff antigen POSITIVE (A) NEGATIVE Final   C Diff toxin POSITIVE (A) NEGATIVE Final   C Diff interpretation Toxin producing C. difficile detected.  Final    Comment: RESULT CALLED TO, READ BACK BY AND VERIFIED WITH: AUSTIN REEVES 09/27/20 0606 LFD Performed at [facility], [address] [place], [place] [address]   Resp Panel by RT-PCR (Flu A&B, Covid) Nasopharyngeal Swab     Status: None   Collection Time: 09/27/20  1:34 AM   Specimen: Nasopharyngeal Swab; Nasopharyngeal(NP) swabs in vial transport medium  Result Value Ref Range Status   SARS Coronavirus 2 by RT PCR NEGATIVE NEGATIVE Final    Comment: (NOTE) SARS-CoV-2 target nucleic acids are NOT DETECTED.  The SARS-CoV-2 RNA is generally detectable in upper respiratory specimens during the acute phase of infection. The lowest concentration of SARS-CoV-2 viral copies this assay can detect is 138 copies/mL. A negative result does not preclude SARS-Cov-2 infection and should not be used as the sole basis for treatment or other patient management decisions. A negative result may occur with  improper specimen collection/handling, submission of specimen other than nasopharyngeal swab, presence of viral mutation(s) within the areas targeted by this assay, and inadequate number of viral copies(<138 copies/mL). A negative result must be combined with clinical observations, patient history, and epidemiological information. The expected result is Negative.  Fact Sheet for Patients:  EntrepreneurPulse.com.au  Fact Sheet for Healthcare Providers:   IncredibleEmployment.be  This test is no t yet approved or cleared by the [place] FDA and  has been authorized for detection and/or diagnosis of SARS-CoV-2 by FDA under an Emergency Use Authorization (EUA). This EUA will remain  in effect (meaning this test can be used) for the duration of the COVID-19 declaration under Section 564(b)(1) of the Act, 21 U.S.C.section 360bbb-3(b)(1), unless the authorization is terminated  or revoked sooner.       Influenza A by PCR NEGATIVE NEGATIVE Final   Influenza B by PCR NEGATIVE NEGATIVE Final  Comment: (NOTE) The Xpert Xpress SARS-CoV-2/FLU/RSV plus assay is intended as an aid in the diagnosis of influenza from Nasopharyngeal swab specimens and should not be used as a sole basis for treatment. Nasal washings and aspirates are unacceptable for Xpert Xpress SARS-CoV-2/FLU/RSV testing.  Fact Sheet for Patients: EntrepreneurPulse.com.au  Fact Sheet for Healthcare Providers: IncredibleEmployment.be  This test is not yet approved or cleared by the [place] FDA and has been authorized for detection and/or diagnosis of SARS-CoV-2 by FDA under an Emergency Use Authorization (EUA). This EUA will remain in effect (meaning this test can be used) for the duration of the COVID-19 declaration under Section 564(b)(1) of the Act, 21 U.S.C. section 360bbb-3(b)(1), unless the authorization is terminated or revoked.  Performed at [facility], [address] [place], [place] [address]   Culture, blood (Routine X 2) w Reflex to ID Panel     Status: None (Preliminary result)   Collection Time: 09/27/20  1:41 AM   Specimen: BLOOD  Result Value Ref Range Status   Specimen Description BLOOD LEFT ANTECUBITAL  Final   Special Requests   Final    BOTTLES DRAWN AEROBIC AND ANAEROBIC Blood Culture results may not be optimal due to an excessive volume of blood received in culture  bottles   Culture   Final    NO GROWTH < 12 HOURS Performed at [facility], [address], [place], [place] [address]    Report Status PENDING  Incomplete         Radiology Studies: CT ABDOMEN PELVIS WO CONTRAST  Result Date: 09/27/2020 CLINICAL DATA:  Nausea, vomiting, diarrhea. Recurrent C difficile colitis. EXAM: CT ABDOMEN AND PELVIS WITHOUT CONTRAST TECHNIQUE: Multidetector CT imaging of the abdomen and pelvis was performed following the standard protocol without IV contrast. COMPARISON:  09/01/2020. FINDINGS: Lower chest: Benign calcified granuloma within the left lung base. The visualized lung bases are otherwise clear. Pacemaker leads are seen within the right atrium and right ventricle. Global cardiac size is mildly enlarged. No pericardial effusion. Hepatobiliary: No focal liver abnormality is seen. No gallstones, gallbladder wall thickening, or biliary dilatation. Pancreas: Unremarkable Spleen: Unremarkable Adrenals/Urinary Tract: Adrenal glands are unremarkable. Kidneys are normal, without renal calculi, focal lesion, or hydronephrosis. Bladder is unremarkable. Stomach/Bowel: Extensive inflammatory changes involve the sigmoid flexure of the colon and descending colon with milder changes involving the sigmoid colon and ascending colon identified. These all appear improved since prior examination may simply represent the residua of the given history of a pseudomembranous colitis. Extensive asymmetric inflammatory changes within the splenic flexure, however, also raise the question of a ischemic colitis given its watershed distribution. Moderate to severe pancolonic diverticulosis is again noted, most severe within the sigmoid colon. A complex supraumbilical ventral hernia is again identified with 4 separate hernia sacs containing multiple loops of [place] and small bowel. The stomach, small bowel, and [place] bowel are otherwise unremarkable. No evidence of obstruction. Appendix  normal. No free intraperitoneal gas or fluid. Vascular/Lymphatic: Mild aortoiliac atherosclerotic calcification. No aortic aneurysm. No pathologic adenopathy within the abdomen and pelvis. Reproductive: Status post hysterectomy. No adnexal masses. Other: The rectum is unremarkable. Musculoskeletal: Degenerative changes are seen within the a lumbar spine. No lytic or blastic bone lesions are seen. IMPRESSION: Improving inflammatory changes involving the colon with residual inflammatory change identified involving the ascending colon and distal colon, most severe within the splenic flexure. While this most likely represents the residua of prior reported pseudomembranous colitis, ischemic colitis could be considered given the  watershed distribution of the area of primary inflammation, particularly if there is been a history of significant hypotension. No evidence of a obstruction or perforation. Complex supraumbilical ventral hernia with at least 4 separate hernia sacs containing multiple loops of [place] and small bowel. No evidence of obstruction. Pancolonic diverticulosis, severe within the sigmoid colon. Aortic Atherosclerosis (ICD10-I70.0). Electronically Signed   By: Fidela Salisbury MD   On: 09/27/2020 02:19        Scheduled Meds: . allopurinol  100 mg Oral BID  . apixaban  5 mg Oral BID  . carvedilol  12.5 mg Oral BID WC  . fidaxomicin  200 mg Oral BID  . flecainide  100 mg Oral BID  . hydrALAZINE  25 mg Oral BID  . levothyroxine  175 mcg Oral QAC breakfast  . losartan  100 mg Oral Daily   Continuous Infusions:   LOS: 1 day    Time spent: 35 minutes    Jamey Demchak A Kiko Ripp, MD [facility]   If 7PM-7AM, please contact night-coverage [email or web address]  09/28/2020, 7:58 AM

## 2020-09-28 NOTE — Consult Note (Signed)
NAME: [name] [name]  DOB: 1955-11-14  MRN: [id number]  Date/Time: 09/28/2020 9:58 AM  REQUESTING PROVIDER: Dr.Niu Subjective:  REASON FOR CONSULT:  cdiff colitis ? [name] [name] is a 65 y.o. female with a history of Afib, pacemaker, HTN, GOUT, hypothyroidism presenting to the ED brought in by EMS with N/V  diarrhea, weakness . Pt was recently in hospital betwene 5/8-5/21/22 for Cediff colitis and sepsis- She was treated with Vancomycin and DC home on PO vanco. She could not get the medicine for 2 days after discharge due to liquid vanco not being available and the capsule strenght not being available. She evetually finished the course and was doing well until Friday when she had frequent soft stools. On Saturday she had loose watery stool every few hours and was feeling weak , almost passing out. She also had fever, nausea and abdominal pain. In the ED BP 170/71, temp 100.1, HR 64 and sats 96% WBC was 14.9, Hb 12.2, plt 234. Cr 0.77. stool positive for cdiff. CT abdomen showed Improving inflammatory changes involving the colon with residual inflammatory change identified involving the ascending colon and distal colon, most severe within the splenic flexure. She was given Iv flagyl one dose and then started on fidoxamicin. I am seeing her for the same She is feeling better today. Appetite has improved. 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 Anemia   . Arrhythmia   . Atrial fibrillation ([place])   . Cancer ([place])   . Endometriosis   . GERD (gastroesophageal reflux disease)   . Gout   . Heart disease   . Hypothyroidism   . Obesity   . Prediabetes   . Renal disorder   . Ventricular hypertrophy     Past Surgical History:  Procedure Laterality Date  .  ABDOMINAL HYSTERECTOMY    . ADENOIDECTOMY    . DILATION AND CURETTAGE OF UTERUS    . TONSILLECTOMY      Social History   Socioeconomic History  . Marital status: Single    Spouse name: Not on file  . Number of children: Not on file  . Years of education: Not on file  . Highest education level: Not on file  Occupational History  . Not on file  Tobacco Use  . Smoking status: Never Smoker  . Smokeless tobacco: Never Used  Vaping Use  . Vaping Use: Never used  Substance and Sexual Activity  . Alcohol use: No  . Drug use: No  . Sexual activity: Not on file  Other Topics Concern  . Not on file  Social History Narrative  . Not on file   Social Determinants of Health   Financial Resource Strain: Not on file  Food Insecurity: Not on file  Transportation Needs: Not on file  Physical Activity: Not on file  Stress: Not on file  Social Connections: Not on file  Intimate Partner Violence: Not on file    Family History  Problem Relation Age of Onset  . Diabetes Mother   . Hypertension Mother   . Heart attack Father    Allergies  Allergen Reactions  . Amlodipine Other (See Comments)    dizziness  . Penicillins Itching  . Tape  Rash   I? Current Facility-Administered Medications  Medication Dose Route Frequency Provider Last Rate Last Admin  . acetaminophen (TYLENOL) tablet 650 mg  650 mg Oral Q6H PRN Ivor Costa, MD   650 mg at 09/27/20 1546  . allopurinol (ZYLOPRIM) tablet 100 mg  100 mg Oral BID Ivor Costa, MD   100 mg at 09/28/20 0944  . apixaban (ELIQUIS) tablet 5 mg  5 mg Oral BID Ivor Costa, MD   5 mg at 09/28/20 0944  . carvedilol (COREG) tablet 12.5 mg  12.5 mg Oral BID WC Ivor Costa, MD   12.5 mg at 09/28/20 0944  . colchicine tablet 0.6 mg  0.6 mg Oral Daily PRN Ivor Costa, MD      . fidaxomicin (DIFICID) tablet 200 mg  200 mg Oral BID Ivor Costa, MD   200 mg at 09/28/20 0943  . flecainide (TAMBOCOR) tablet 100 mg  100 mg Oral BID Ivor Costa, MD   100 mg at  09/28/20 0943  . hydrALAZINE (APRESOLINE) injection 5 mg  5 mg Intravenous Q2H PRN Ivor Costa, MD      . hydrALAZINE (APRESOLINE) tablet 25 mg  25 mg Oral BID Ivor Costa, MD   25 mg at 09/28/20 0943  . levothyroxine (SYNTHROID) tablet 175 mcg  175 mcg Oral QAC breakfast Ivor Costa, MD   175 mcg at 09/28/20 [phone]  . losartan (COZAAR) tablet 100 mg  100 mg Oral Daily Ivor Costa, MD   100 mg at 09/28/20 0944  . magnesium sulfate IVPB 2 g 50 mL  2 g Intravenous Once Regalado, Belkys A, MD 50 mL/hr at 09/28/20 0950 2 g at 09/28/20 0950  . morphine 2 MG/ML injection 2 mg  2 mg Intravenous Q4H PRN Ivor Costa, MD      . ondansetron [facility]) injection 4 mg  4 mg Intravenous Q8H PRN Ivor Costa, MD   4 mg at 09/27/20 1546  . oxyCODONE-acetaminophen (PERCOCET/ROXICET) 5-325 MG per tablet 1 tablet  1 tablet Oral Q4H PRN Ivor Costa, MD         Abtx:  Anti-infectives (From admission, onward)   Start     Dose/Rate Route Frequency Ordered Stop   09/27/20 1000  fidaxomicin (DIFICID) tablet 200 mg        200 mg Oral 2 times daily 09/27/20 0844 10/07/20 0959   09/27/20 0215  metroNIDAZOLE (FLAGYL) IVPB 500 mg        500 mg 100 mL/hr over 60 Minutes Intravenous  Once 09/27/20 0206 09/27/20 0435      REVIEW OF SYSTEMS:  Const:  fever,  Chills,100 pound weight loss following keto diet Eyes: negative diplopia or visual changes, negative eye pain ENT: negative coryza, negative sore throat Resp: negative cough, hemoptysis, dyspnea Cards: negative for chest pain, palpitations, lower extremity edema GU: negative for frequency, dysuria and hematuria GI:  abdominal pain, diarrhea,  Skin: negative for rash and pruritus Heme: negative for easy bruising and gum/nose bleeding MS: generalized weakness Neurolo, dizziness,near syncope Psych: negative for feelings of anxiety, depression  Endocrine: has hypothyroidism Allergy/Immunology- as above Objective:  VITALS:  BP (!) 151/78 (BP Location: Left Arm)   Pulse 64    Temp 98 F (36.7 C) (Oral)   Resp 16   Ht 5\' 8"  (1.727 m)   Wt 110.2 kg   SpO2 94%   BMI 36.95 kg/m  PHYSICAL EXAM:  General: Alert, cooperative, no distress, appears stated age.  Head: Normocephalic, without obvious abnormality, atraumatic. Eyes: Conjunctivae clear,  anicteric sclerae. Pupils are equal ENT Nares normal. No drainage or sinus tenderness. Tongue coated Neck: Supple, symmetrical, no adenopathy, thyroid: non tender no carotid bruit and no JVD. Back: No CVA tenderness. Lungs: Clear to auscultation bilaterally. No Wheezing or Rhonchi. No rales. Heart: Regular rate and rhythm, no murmur, rub or gallop. Abdomen: Soft, non-tender,not distended. Bowel sounds normal. No masses Extremities:ecchymosis over rt knee B/l venous stasis and pigmentation Skin: rt side of face/neck bruise Lymph: Cervical, supraclavicular normal. Neurologic: Grossly non-focal Pertinent Labs Lab Results CBC    Component Value Date/Time   WBC 8.0 09/28/2020 0632   RBC 3.28 (L) 09/28/2020 0632   HGB 11.0 (L) 09/28/2020 0632   HCT 33.1 (L) 09/28/2020 0632   PLT 187 09/28/2020 0632   MCV 100.9 (H) 09/28/2020 0632   MCH 33.5 09/28/2020 0632   MCHC 33.2 09/28/2020 0632   RDW 14.1 09/28/2020 0632   LYMPHSABS 1.0 09/27/2020 0113   MONOABS 1.7 (H) 09/27/2020 0113   EOSABS 0.1 09/27/2020 0113   BASOSABS 0.1 09/27/2020 0113    CMP Latest Ref Rng & Units 09/28/2020 09/27/2020 09/10/2020  Glucose 70 - 99 mg/dL 92 112(H) 91  BUN 8 - 23 mg/dL 15 16 17   Creatinine 0.44 - 1.00 mg/dL 0.83 0.77 0.69  Sodium 135 - 145 mmol/L 138 136 140  Potassium 3.5 - 5.1 mmol/L 3.7 3.4(L) 3.9  Chloride 98 - 111 mmol/L 104 102 109  CO2 22 - 32 mmol/L 27 25 26   Calcium 8.9 - 10.3 mg/dL 8.5(L) 8.9 8.0(L)  Total Protein 6.5 - 8.1 g/dL - 6.4(L) 4.3(L)  Total Bilirubin 0.3 - 1.2 mg/dL - 0.9 0.4  Alkaline Phos 38 - 126 U/L - 76 61  AST 15 - 41 U/L - 21 29  ALT 0 - 44 U/L - 15 16      Microbiology: Recent Results (from  the past 240 hour(s))  Culture, blood (Routine X 2) w Reflex to ID Panel     Status: None (Preliminary result)   Collection Time: 09/27/20  1:34 AM   Specimen: BLOOD  Result Value Ref Range Status   Specimen Description BLOOD RIGHT ANTECUBITAL  Final   Special Requests   Final    BOTTLES DRAWN AEROBIC AND ANAEROBIC Blood Culture adequate volume   Culture   Final    NO GROWTH 1 DAY Performed at [facility], [address] [place], [place] [address]    Report Status PENDING  Incomplete  Gastrointestinal Panel by PCR , Stool     Status: None   Collection Time: 09/27/20  1:34 AM   Specimen: Stool  Result Value Ref Range Status   Campylobacter species NOT DETECTED NOT DETECTED Final   Plesimonas shigelloides NOT DETECTED NOT DETECTED Final   Salmonella species NOT DETECTED NOT DETECTED Final   Yersinia enterocolitica NOT DETECTED NOT DETECTED Final   Vibrio species NOT DETECTED NOT DETECTED Final   Vibrio cholerae NOT DETECTED NOT DETECTED Final   Enteroaggregative E coli (EAEC) NOT DETECTED NOT DETECTED Final   Enteropathogenic E coli (EPEC) NOT DETECTED NOT DETECTED Final   Enterotoxigenic E coli (ETEC) NOT DETECTED NOT DETECTED Final   Shiga like toxin producing E coli (STEC) NOT DETECTED NOT DETECTED Final   Shigella/Enteroinvasive E coli (EIEC) NOT DETECTED NOT DETECTED Final   Cryptosporidium NOT DETECTED NOT DETECTED Final   Cyclospora cayetanensis NOT DETECTED NOT DETECTED Final   Entamoeba histolytica NOT DETECTED NOT DETECTED Final   Giardia lamblia NOT DETECTED NOT DETECTED  Final   Adenovirus F40/41 NOT DETECTED NOT DETECTED Final   Astrovirus NOT DETECTED NOT DETECTED Final   Norovirus GI/GII NOT DETECTED NOT DETECTED Final   Rotavirus A NOT DETECTED NOT DETECTED Final   Sapovirus (I, II, IV, and V) NOT DETECTED NOT DETECTED Final    Comment: Performed at [facility], [address] [place], [place] [address]  C Difficile Quick Screen w PCR reflex      Status: Abnormal   Collection Time: 09/27/20  1:34 AM   Specimen: Stool  Result Value Ref Range Status   C Diff antigen POSITIVE (A) NEGATIVE Final   C Diff toxin POSITIVE (A) NEGATIVE Final   C Diff interpretation Toxin producing C. difficile detected.  Final    Comment: RESULT CALLED TO, READ BACK BY AND VERIFIED WITH: AUSTIN REEVES 09/27/20 0606 LFD Performed at [facility], [address] [place], [place] [address]   Resp Panel by RT-PCR (Flu A&B, Covid) Nasopharyngeal Swab     Status: None   Collection Time: 09/27/20  1:34 AM   Specimen: Nasopharyngeal Swab; Nasopharyngeal(NP) swabs in vial transport medium  Result Value Ref Range Status   SARS Coronavirus 2 by RT PCR NEGATIVE NEGATIVE Final    Comment: (NOTE) SARS-CoV-2 target nucleic acids are NOT DETECTED.  The SARS-CoV-2 RNA is generally detectable in upper respiratory specimens during the acute phase of infection. The lowest concentration of SARS-CoV-2 viral copies this assay can detect is 138 copies/mL. A negative result does not preclude SARS-Cov-2 infection and should not be used as the sole basis for treatment or other patient management decisions. A negative result may occur with  improper specimen collection/handling, submission of specimen other than nasopharyngeal swab, presence of viral mutation(s) within the areas targeted by this assay, and inadequate number of viral copies(<138 copies/mL). A negative result must be combined with clinical observations, patient history, and epidemiological information. The expected result is Negative.  Fact Sheet for Patients:  EntrepreneurPulse.com.au  Fact Sheet for Healthcare Providers:  IncredibleEmployment.be  This test is no t yet approved or cleared by the [place] FDA and  has been authorized for detection and/or diagnosis of SARS-CoV-2 by FDA under an Emergency Use Authorization (EUA). This EUA will remain  in  effect (meaning this test can be used) for the duration of the COVID-19 declaration under Section 564(b)(1) of the Act, 21 U.S.C.section 360bbb-3(b)(1), unless the authorization is terminated  or revoked sooner.       Influenza A by PCR NEGATIVE NEGATIVE Final   Influenza B by PCR NEGATIVE NEGATIVE Final    Comment: (NOTE) The Xpert Xpress SARS-CoV-2/FLU/RSV plus assay is intended as an aid in the diagnosis of influenza from Nasopharyngeal swab specimens and should not be used as a sole basis for treatment. Nasal washings and aspirates are unacceptable for Xpert Xpress SARS-CoV-2/FLU/RSV testing.  Fact Sheet for Patients: EntrepreneurPulse.com.au  Fact Sheet for Healthcare Providers: IncredibleEmployment.be  This test is not yet approved or cleared by the [place] FDA and has been authorized for detection and/or diagnosis of SARS-CoV-2 by FDA under an Emergency Use Authorization (EUA). This EUA will remain in effect (meaning this test can be used) for the duration of the COVID-19 declaration under Section 564(b)(1) of the Act, 21 U.S.C. section 360bbb-3(b)(1), unless the authorization is terminated or revoked.  Performed at [facility], [address] [place], [place] [address]   Culture, blood (Routine X 2) w Reflex to ID Panel     Status:  None (Preliminary result)   Collection Time: 09/27/20  1:41 AM   Specimen: BLOOD  Result Value Ref Range Status   Specimen Description BLOOD LEFT ANTECUBITAL  Final   Special Requests   Final    BOTTLES DRAWN AEROBIC AND ANAEROBIC Blood Culture results may not be optimal due to an excessive volume of blood received in culture bottles   Culture   Final    NO GROWTH 1 DAY Performed at [facility], [address] [place], [place] [address]    Report Status PENDING  Incomplete    IMAGING RESULTS: CT abdomen Improving inflammatory changes involving the colon with  residual inflammatory change identified involving the ascending colon and distal colon, most severe within the splenic flexure.  I have personally reviewed the films ? Impression/Recommendation ? Cdiff colitis- recurrence- recently treated 5/8-5/21 with Po vanco. Will give fidoxamicin for 15 days- first 5 days BID and then 200mg  every other day for 10 days  Leucocytosis resolved  Recent fall Ecchymosis rt leg  Afib-on flecanide PCM HTN- on coreg hypothyrodism on synthroid  ? ? ___________________________________________________ Discussed with patient, requesting provider Note:  This document was prepared using Dragon voice recognition software and may include unintentional dictation errors.

## 2020-09-29 ENCOUNTER — Other Ambulatory Visit: Payer: Self-pay

## 2020-09-29 ENCOUNTER — Other Ambulatory Visit (HOSPITAL_COMMUNITY): Payer: Self-pay

## 2020-09-29 LAB — BASIC METABOLIC PANEL
Anion gap: 6 (ref 5–15)
BUN: 20 mg/dL (ref 8–23)
CO2: 27 mmol/L (ref 22–32)
Calcium: 8.5 mg/dL — ABNORMAL LOW (ref 8.9–10.3)
Chloride: 104 mmol/L (ref 98–111)
Creatinine, Ser: 0.88 mg/dL (ref 0.44–1.00)
GFR, Estimated: >60 mL/min (ref >60)
Glucose, Bld: 93 mg/dL (ref 70–99)
Potassium: 4.2 mmol/L (ref 3.5–5.1)
Sodium: 137 mmol/L (ref 135–145)

## 2020-09-29 LAB — CBC
HCT: 33.3 % — ABNORMAL LOW (ref 36.0–46.0)
Hemoglobin: 11.1 g/dL — ABNORMAL LOW (ref 12.0–15.0)
MCH: 33.4 pg (ref 26.0–34.0)
MCHC: 33.3 g/dL (ref 30.0–36.0)
MCV: 100.3 fL — ABNORMAL HIGH (ref 80.0–100.0)
Platelets: 205 10*3/uL (ref 150–400)
RBC: 3.32 MIL/uL — ABNORMAL LOW (ref 3.87–5.11)
RDW: 13.8 % (ref 11.5–15.5)
WBC: 6.2 10*3/uL (ref 4.0–10.5)
nRBC: 0 % (ref 0.0–0.2)

## 2020-09-29 LAB — VITAMIN B12: Vitamin B-12: 334 pg/mL (ref 180–914)

## 2020-09-29 LAB — MAGNESIUM: Magnesium: 2.2 mg/dL (ref 1.7–2.4)

## 2020-09-29 LAB — TROPONIN I (HIGH SENSITIVITY)
Troponin I (High Sensitivity): 8 ng/L (ref <18)
Troponin I (High Sensitivity): 8 ng/L (ref <18)

## 2020-09-29 MED ORDER — DIFICID 200 MG PO TABS
200.0000 mg | ORAL_TABLET | ORAL | 0 refills | Status: DC
  Filled 2020-09-29: qty 20, 25d supply, fill #0
  Filled 2020-09-29: qty 20, 10d supply, fill #0
  Filled 2020-09-29: qty 20, 20d supply, fill #0

## 2020-09-29 NOTE — Progress Notes (Addendum)
PROGRESS NOTE    [name] [name]  KKX:[id number] DOB: 03/30/1956 DOA: 09/27/2020 PCP: Barbaraann Boys, MD   Brief Narrative: 82 with past medical history significant for hypertension, prediabetes, GERD, hypothyroidism, gout, tachycardia syndrome, status post pacemaker, A. fib on Eliquis, endometriosis, C. difficile colitis, obesity who presents complaining of abdominal pain, nausea vomiting and diarrhea.  Of note patient was recently hospitalized to ICU due to septic shock secondary to C. difficile colitis from 5/81 till 5/20.  Discharged on oral vancomycin, completed treatment.  She presented with worsening abdominal pain, diarrhea 3 days prior to this admission.  She also reported multiple episode of vomiting.  She was restarted on oral vancomycin on Friday without significant improvement.  Evaluation in the ED: Patient was found to have leukocytosis white blood cell 14.9, C  Difficile positive for antigen and toxin, temperature 100.1. CT abdomen and pelvis: Improving inflammatory changes involving the colon with residual inflammatory changes identified involving the ascending colon and distal colon, most severe within the splenic flexure.  While this most likely represented a series of prior reported pseudomembranous colitis, ischemic colitis could be considered given the watershed distribution of the area of primary inflammation, particularly if there is been a history of significant hypotension.No evidence of a obstruction or perforation. Complex supraumbilical ventral hernia with at least 4 separate hernia sacs containing multiple loops of Triska and small bowel. No evidence of obstruction.  Patient admitted with recurrent C diff. Started on Dificid.   Assessment & Plan:   Principal Problem:   C. difficile colitis Active Problems:   Atrial fibrillation (HCC)   Gout   Hypothyroidism   HTN (hypertension)   Hypokalemia  1-Recurrent C. difficile Colitis: -C. difficile colitis and also  findings concerning for ischemic colitis. -She was evaluated by general surgeon Dr. Celine Ahr, who recommend no surgical intervention at this time. -C diff  toxin and antigen positive -Patient was evaluated by Dr. Steva Ready, who recommended Fidaxomicin (Dificid)/  -Day 3 Fidaxomicin. ID recommend total of 15 days treatment: First five days of dificid BID then 10 days of -Dificid 200 mg every other day.  -Received IV fluids.  -Patient report 2 BM today, watery. Yesterday had 6 BM. -Pharmacist is working on making sure [employer] will cover for [employer].  Addendum;  Pharmacist will deliver Dificid to patient room today or tomorrow.   Paroxysmal A fib:  Continue with Coreg, eliquis, Flecainide.   Hypomagnesemia;  Replaced.  Repeat labs tomorrow.   Hypothyroidism: Continue with Synthroid  Gout: Continue with allopurinol and as needed colchicine  Hypertension: Continue with hydralazine Cozaar and Coreg. Currently holding Lasix.  Hypokalemia; replaced.   Macrocytosis. B 12: 334 Chest pain;  EKG ; No st elevation.  Troponin first one negative.   Estimated body mass index is 36.95 kg/m as calculated from the following:   Height as of this encounter: 5\' 8"  (1.727 m).   Weight as of this encounter: 110.2 kg.   DVT prophylaxis: Eliquis Code Status: Full Code Family Communication: Care discussed with patient.  Disposition Plan:  Status is: Inpatient  Remains inpatient appropriate because:Inpatient level of care appropriate due to severity of illness   Dispo: The patient is from: Home              Anticipated d/c is to: Home              Patient currently is not medically stable to d/c.   Difficult to place patient No  Consultants:   ID  Surgery   Procedures:   None  Antimicrobials:  Dificid.    Subjective: She is starting to feel better.  She has had 2 watery BM this am.  She had total of 6 BM yesterday.  She had episode  of chest , epigastric pain this am on standing, got better when she ly down. Was transient. "Felt like organ were in wrong place"  Objective: Vitals:   09/29/20 0031 09/29/20 0455 09/29/20 0801 09/29/20 1146  BP: 136/66 (!) 158/80 (!) 173/86 (!) 146/77  Pulse: 64 68 62 63  Resp: 16 16 17 16   Temp: 98.9 F (37.2 C) 98.3 F (36.8 C) 98.6 F (37 C) 98.3 F (36.8 C)  TempSrc: Oral  Oral Oral  SpO2: 94% 94% 99% 95%  Weight:      Height:       No intake or output data in the 24 hours ending 09/29/20 1428 Filed Weights   09/27/20 0013  Weight: 110.2 kg    Examination:  General exam: NAD Respiratory system: CTA Cardiovascular system: S 1, S 2 IRR Gastrointestinal system: BS present, soft, nt Central nervous system: alert, non focal.  Extremities: Symmetric power.     Data Reviewed: I have personally reviewed following labs and imaging studies  CBC: Recent Labs  Lab 09/27/20 0113 09/28/20 0632 09/29/20 0546  WBC 14.9* 8.0 6.2  NEUTROABS 12.0*  --   --   HGB 12.2 11.0* 11.1*  HCT 36.8 33.1* 33.3*  MCV 101.1* 100.9* 100.3*  PLT 234 187 740   Basic Metabolic Panel: Recent Labs  Lab 09/27/20 0113 09/27/20 1153 09/28/20 0632 09/29/20 0546  NA 136  --  138 137  K 3.4*  --  3.7 4.2  CL 102  --  104 104  CO2 25  --  27 27  GLUCOSE 112*  --  92 93  BUN 16  --  15 20  CREATININE 0.77  --  0.83 0.88  CALCIUM 8.9  --  8.5* 8.5*  MG  --  1.6*  --  2.2   GFR: Estimated Creatinine Clearance: 84 mL/min (by C-G formula based on SCr of 0.88 mg/dL). Liver Function Tests: Recent Labs  Lab 09/27/20 0113  AST 21  ALT 15  ALKPHOS 76  BILITOT 0.9  PROT 6.4*  ALBUMIN 3.5   Recent Labs  Lab 09/27/20 0113  LIPASE 27   No results for input(s): AMMONIA in the last 168 hours. Coagulation Profile: Recent Labs  Lab 09/27/20 1153  INR 1.2   Cardiac Enzymes: No results for input(s): CKTOTAL, CKMB, CKMBINDEX, TROPONINI in the last 168 hours. BNP (last 3 results) No  results for input(s): PROBNP in the last 8760 hours. HbA1C: No results for input(s): HGBA1C in the last 72 hours. CBG: No results for input(s): GLUCAP in the last 168 hours. Lipid Profile: No results for input(s): CHOL, HDL, LDLCALC, TRIG, CHOLHDL, LDLDIRECT in the last 72 hours. Thyroid Function Tests: No results for input(s): TSH, T4TOTAL, FREET4, T3FREE, THYROIDAB in the last 72 hours. Anemia Panel: Recent Labs    09/29/20 0546  VITAMINB12 334   Sepsis Labs: Recent Labs  Lab 09/27/20 0134  LATICACIDVEN 1.6    Recent Results (from the past 240 hour(s))  Culture, blood (Routine X 2) w Reflex to ID Panel     Status: None (Preliminary result)   Collection Time: 09/27/20  1:34 AM   Specimen: BLOOD  Result Value Ref Range Status   Specimen Description  BLOOD RIGHT ANTECUBITAL  Final   Special Requests   Final    BOTTLES DRAWN AEROBIC AND ANAEROBIC Blood Culture adequate volume   Culture   Final    NO GROWTH 2 DAYS Performed at [facility], [address] [place], [place] [address]    Report Status PENDING  Incomplete  Gastrointestinal Panel by PCR , Stool     Status: None   Collection Time: 09/27/20  1:34 AM   Specimen: Stool  Result Value Ref Range Status   Campylobacter species NOT DETECTED NOT DETECTED Final   Plesimonas shigelloides NOT DETECTED NOT DETECTED Final   Salmonella species NOT DETECTED NOT DETECTED Final   Yersinia enterocolitica NOT DETECTED NOT DETECTED Final   Vibrio species NOT DETECTED NOT DETECTED Final   Vibrio cholerae NOT DETECTED NOT DETECTED Final   Enteroaggregative E coli (EAEC) NOT DETECTED NOT DETECTED Final   Enteropathogenic E coli (EPEC) NOT DETECTED NOT DETECTED Final   Enterotoxigenic E coli (ETEC) NOT DETECTED NOT DETECTED Final   Shiga like toxin producing E coli (STEC) NOT DETECTED NOT DETECTED Final   Shigella/Enteroinvasive E coli (EIEC) NOT DETECTED NOT DETECTED Final   Cryptosporidium NOT DETECTED NOT DETECTED Final    Cyclospora cayetanensis NOT DETECTED NOT DETECTED Final   Entamoeba histolytica NOT DETECTED NOT DETECTED Final   Giardia lamblia NOT DETECTED NOT DETECTED Final   Adenovirus F40/41 NOT DETECTED NOT DETECTED Final   Astrovirus NOT DETECTED NOT DETECTED Final   Norovirus GI/GII NOT DETECTED NOT DETECTED Final   Rotavirus A NOT DETECTED NOT DETECTED Final   Sapovirus (I, II, IV, and V) NOT DETECTED NOT DETECTED Final    Comment: Performed at [facility], [address] [place], [place] [address]  C Difficile Quick Screen w PCR reflex     Status: Abnormal   Collection Time: 09/27/20  1:34 AM   Specimen: Stool  Result Value Ref Range Status   C Diff antigen POSITIVE (A) NEGATIVE Final   C Diff toxin POSITIVE (A) NEGATIVE Final   C Diff interpretation Toxin producing C. difficile detected.  Final    Comment: RESULT CALLED TO, READ BACK BY AND VERIFIED WITH: AUSTIN REEVES 09/27/20 0606 LFD Performed at [facility], [address] [place], [place] [address]   Resp Panel by RT-PCR (Flu A&B, Covid) Nasopharyngeal Swab     Status: None   Collection Time: 09/27/20  1:34 AM   Specimen: Nasopharyngeal Swab; Nasopharyngeal(NP) swabs in vial transport medium  Result Value Ref Range Status   SARS Coronavirus 2 by RT PCR NEGATIVE NEGATIVE Final    Comment: (NOTE) SARS-CoV-2 target nucleic acids are NOT DETECTED.  The SARS-CoV-2 RNA is generally detectable in upper respiratory specimens during the acute phase of infection. The lowest concentration of SARS-CoV-2 viral copies this assay can detect is 138 copies/mL. A negative result does not preclude SARS-Cov-2 infection and should not be used as the sole basis for treatment or other patient management decisions. A negative result may occur with  improper specimen collection/handling, submission of specimen other than nasopharyngeal swab, presence of viral mutation(s) within the areas targeted by this assay, and inadequate  number of viral copies(<138 copies/mL). A negative result must be combined with clinical observations, patient history, and epidemiological information. The expected result is Negative.  Fact Sheet for Patients:  EntrepreneurPulse.com.au  Fact Sheet for Healthcare Providers:  IncredibleEmployment.be  This test is no t yet approved or cleared by the [place] FDA and  has  been authorized for detection and/or diagnosis of SARS-CoV-2 by FDA under an Emergency Use Authorization (EUA). This EUA will remain  in effect (meaning this test can be used) for the duration of the COVID-19 declaration under Section 564(b)(1) of the Act, 21 U.S.C.section 360bbb-3(b)(1), unless the authorization is terminated  or revoked sooner.       Influenza A by PCR NEGATIVE NEGATIVE Final   Influenza B by PCR NEGATIVE NEGATIVE Final    Comment: (NOTE) The Xpert Xpress SARS-CoV-2/FLU/RSV plus assay is intended as an aid in the diagnosis of influenza from Nasopharyngeal swab specimens and should not be used as a sole basis for treatment. Nasal washings and aspirates are unacceptable for Xpert Xpress SARS-CoV-2/FLU/RSV testing.  Fact Sheet for Patients: EntrepreneurPulse.com.au  Fact Sheet for Healthcare Providers: IncredibleEmployment.be  This test is not yet approved or cleared by the [place] FDA and has been authorized for detection and/or diagnosis of SARS-CoV-2 by FDA under an Emergency Use Authorization (EUA). This EUA will remain in effect (meaning this test can be used) for the duration of the COVID-19 declaration under Section 564(b)(1) of the Act, 21 U.S.C. section 360bbb-3(b)(1), unless the authorization is terminated or revoked.  Performed at [facility], [address] [place], [place] [address]   Culture, blood (Routine X 2) w Reflex to ID Panel     Status: None (Preliminary result)   Collection  Time: 09/27/20  1:41 AM   Specimen: BLOOD  Result Value Ref Range Status   Specimen Description BLOOD LEFT ANTECUBITAL  Final   Special Requests   Final    BOTTLES DRAWN AEROBIC AND ANAEROBIC Blood Culture results may not be optimal due to an excessive volume of blood received in culture bottles   Culture   Final    NO GROWTH 2 DAYS Performed at [facility], [address] [place], [place] [address]    Report Status PENDING  Incomplete         Radiology Studies: No results found.      Scheduled Meds: . allopurinol  100 mg Oral BID  . apixaban  5 mg Oral BID  . carvedilol  12.5 mg Oral BID WC  . fidaxomicin  200 mg Oral BID  . flecainide  100 mg Oral BID  . hydrALAZINE  25 mg Oral BID  . levothyroxine  175 mcg Oral QAC breakfast  . losartan  100 mg Oral Daily   Continuous Infusions:   LOS: 2 days    Time spent: 35 minutes    Temiloluwa Recchia A Shalamar Crays, MD [facility]   If 7PM-7AM, please contact night-coverage [email or web address]  09/29/2020, 2:28 PM

## 2020-09-30 DIAGNOSIS — I480 Paroxysmal atrial fibrillation: Secondary | ICD-10-CM

## 2020-09-30 DIAGNOSIS — E669 Obesity, unspecified: Secondary | ICD-10-CM

## 2020-09-30 DIAGNOSIS — D649 Anemia, unspecified: Secondary | ICD-10-CM

## 2020-09-30 DIAGNOSIS — M10171 Lead-induced gout, right ankle and foot: Secondary | ICD-10-CM

## 2020-09-30 DIAGNOSIS — T560X1A Toxic effect of lead and its compounds, accidental (unintentional), initial encounter: Secondary | ICD-10-CM

## 2020-09-30 DIAGNOSIS — K439 Ventral hernia without obstruction or gangrene: Secondary | ICD-10-CM

## 2020-09-30 LAB — BASIC METABOLIC PANEL
Anion gap: 4 — ABNORMAL LOW (ref 5–15)
BUN: 20 mg/dL (ref 8–23)
CO2: 27 mmol/L (ref 22–32)
Calcium: 8.4 mg/dL — ABNORMAL LOW (ref 8.9–10.3)
Chloride: 106 mmol/L (ref 98–111)
Creatinine, Ser: 0.73 mg/dL (ref 0.44–1.00)
GFR, Estimated: >60 mL/min (ref >60)
Glucose, Bld: 91 mg/dL (ref 70–99)
Potassium: 3.9 mmol/L (ref 3.5–5.1)
Sodium: 137 mmol/L (ref 135–145)

## 2020-09-30 LAB — CBC
HCT: 32.7 % — ABNORMAL LOW (ref 36.0–46.0)
Hemoglobin: 10.9 g/dL — ABNORMAL LOW (ref 12.0–15.0)
MCH: 33 pg (ref 26.0–34.0)
MCHC: 33.3 g/dL (ref 30.0–36.0)
MCV: 99.1 fL (ref 80.0–100.0)
Platelets: 202 10*3/uL (ref 150–400)
RBC: 3.3 MIL/uL — ABNORMAL LOW (ref 3.87–5.11)
RDW: 13.6 % (ref 11.5–15.5)
WBC: 5.3 10*3/uL (ref 4.0–10.5)
nRBC: 0 % (ref 0.0–0.2)

## 2020-09-30 LAB — MAGNESIUM: Magnesium: 2 mg/dL (ref 1.7–2.4)

## 2020-09-30 LAB — URIC ACID: Uric Acid, Serum: 5.9 mg/dL (ref 2.5–7.1)

## 2020-09-30 MED ORDER — HYDRALAZINE HCL 25 MG PO TABS: 25.0000 mg | ORAL_TABLET | Freq: Three times a day (TID) | ORAL | 2 refills | Status: AC

## 2020-09-30 MED ORDER — FUROSEMIDE 20 MG PO TABS: 20.0000 mg | ORAL_TABLET | Freq: Every day | ORAL | 0 refills | Status: AC

## 2020-09-30 MED ORDER — HYDRALAZINE HCL 50 MG PO TABS: 50.0000 mg | ORAL_TABLET | Freq: Two times a day (BID) | ORAL | Status: DC

## 2020-09-30 NOTE — Discharge Summary (Signed)
Physician Discharge Summary  [name] [name] NUU:[id number] DOB: [birth year] DOA: 09/27/2020  PCP: Barbaraann Boys, MD  Admit date: 09/27/2020 Discharge date: 09/30/2020  Admitted From: Home Disposition: Home  Recommendations for Outpatient Follow-up:  1. Follow ups as below. 2. Please obtain CBC/BMP/Mag at follow up 3. Please follow up on the following pending results: None  Home Health: Resume outpatient PT Equipment/Devices: None required  Discharge Condition: Stable CODE STATUS: Full code   Hospital Course: 65 year old F with PMH of HTN, prediabetes, SSS s/p PPM, hypothyroidism, A. fib and recent hospitalization from 5/8-08/2018 for septic shock secondary to C. difficile colitis for which she was treated with p.o. vancomycin and discharged on p.o. vancomycin that she completed returning with abdominal pain, nausea, vomiting and diarrhea for 3 days.  Patient had WBC to 14.9.  C. difficile positive antigen and toxin.  She had mild temp to 100.1.  CT abdomen and pelvis showed improved inflammatory changes involving the colon and complex supraumbilical ventral hernia with at least 4 separate hernia sacs containing multiple loops of Tom and small bowel without evidence of obstruction.  General surgery and ID consulted.    Patient was started on Dificid per ID recommendation.  Patient's symptoms improved.  Diarrhea resolved.  ID recommended Dificid 200 mg twice daily for 5 days followed by Dificid 200 mg every other day for 20 days.  Prescription was delivered to bedside prior to discharge.  General surgery did not feel surgery was indicated.  Discharge Diagnoses:  Recurrent C. difficile colitis-GI symptoms including diarrhea resolved. -Discharged on Dificid 200 mg twice daily for 5 days followed by 200 mg every other day, #20  complex supraumbilical ventral hernia  -No indication for surgery per general surgery  Paroxysmal A. Fib -Continue home medications including Coreg, flecainide  and Eliquis  Mild acute gout flareup-some tenderness and pain in her right great toe.  Uric acid 5.9. -Patient has colchicine and allopurinol at home-planning to resume colchicine if worse. -Advised to resume allopurinol after acute flare.  Essential hypertension: BP elevated.  In the setting of IV fluid and holding antihypertensive meds. -Increased home hydralazine from 25 mg twice daily to 3 times daily -Continue home Cozaar and Coreg -Patient to hold Lasix for the next few days  Hypothyroidism -Continue home Synthroid  Hypokalemia/hypomagnesemia: Resolved.  Normocytic anemia: H&H stable.  Atypical chest pain: Resolved.  EKG and serial troponin reassuring.  Class II obesity Body mass index is 36.95 kg/m.  -Encourage lifestyle change to lose weight.          Discharge Exam: Vitals:   09/30/20 0520 09/30/20 0745  BP: (!) 164/85 (!) 173/84  Pulse: (!) 57   Resp: 15   Temp: 97.9 F (36.6 C) 98 F (36.7 C)  SpO2: 97% 95%    GENERAL: No apparent distress.  Nontoxic. HEENT: MMM.  Vision and hearing grossly intact.  NECK: Supple.  No apparent JVD.  RESP: On RA.  No IWOB.  Fair aeration bilaterally. CVS:  RRR. Heart sounds normal.  ABD/GI/GU: Bowel sounds present. Soft.  Mild discomfort with palpation. MSK/EXT:  Moves extremities. No apparent deformity.  Mild erythema and swelling at the base right great toe. SKIN: no apparent skin lesion or wound NEURO: Awake, alert and oriented appropriately.  No apparent focal neuro deficit. PSYCH: Calm. Normal affect.   Discharge Instructions  Discharge Instructions    Call MD for:  extreme fatigue   Complete by: As directed    Call MD for:  persistant dizziness or  light-headedness   Complete by: As directed    Call MD for:  persistant nausea and vomiting   Complete by: As directed    Call MD for:  severe uncontrolled pain   Complete by: As directed    Call MD for:  temperature >100.4   Complete by: As directed    Diet -  low sodium heart healthy   Complete by: As directed    Discharge instructions   Complete by: As directed    It has been a pleasure taking care of you!  You were hospitalized due to nausea, vomiting, diarrhea and abdominal pain from C. difficile infection.  You have been treated with antibiotics, and your symptoms improved to the point we think it is safe to let you go home and follow-up with your primary care doctor.  We are discharging you more antibiotics to complete treatment course.  Please review your new medication list and the directions on your medications before you take them.  In regards to your gout, you may take your colchicine but hold your allopurinol until the acute flareup improves.   Please review your new medication list and the directions on your medications before you take them.   Take care,   Increase activity slowly   Complete by: As directed      Allergies as of 09/30/2020      Reactions   Amlodipine Other (See Comments)   dizziness   Penicillins Itching   Tape Rash      Medication List    TAKE these medications   allopurinol 100 MG tablet Commonly known as: ZYLOPRIM Take 100 mg by mouth 2 (two) times daily.   carvedilol 12.5 MG tablet Commonly known as: COREG Take 1 tablet (12.5 mg total) by mouth 2 (two) times daily.   colchicine 0.6 MG tablet Take 0.6 mg by mouth as needed.   Dificid 200 MG Tabs tablet Generic drug: fidaxomicin Take one tablet by mouth twice daily for 5 days then take one tablet every other day for 20 days   Eliquis 5 MG Tabs tablet Generic drug: apixaban Take 1 tablet by mouth 2 (two) times daily.   flecainide 100 MG tablet Commonly known as: TAMBOCOR Take 100 mg by mouth 2 (two) times daily.   furosemide 20 MG tablet Commonly known as: LASIX Take 1 tablet (20 mg total) by mouth daily. Start taking on: October 03, 2020 What changed: These instructions start on October 03, 2020. If you are unsure what to do until then, ask  your doctor or other care provider.   hydrALAZINE 25 MG tablet Commonly known as: APRESOLINE Take 1 tablet (25 mg total) by mouth 3 (three) times daily. What changed: when to take this   levothyroxine 175 MCG tablet Commonly known as: SYNTHROID Take 175 mcg by mouth daily before breakfast.   losartan 100 MG tablet Commonly known as: COZAAR Take 1 tablet (100 mg total) by mouth daily.   oxyCODONE-acetaminophen 5-325 MG tablet Commonly known as: PERCOCET/ROXICET Take 1 tablet by mouth every 8 (eight) hours as needed.       Consultations:  Infectious disease  General surgery  Procedures/Studies:   CT ABDOMEN PELVIS WO CONTRAST  Result Date: 09/27/2020 CLINICAL DATA:  Nausea, vomiting, diarrhea. Recurrent C difficile colitis. EXAM: CT ABDOMEN AND PELVIS WITHOUT CONTRAST TECHNIQUE: Multidetector CT imaging of the abdomen and pelvis was performed following the standard protocol without IV contrast. COMPARISON:  09/01/2020. FINDINGS: Lower chest: Benign calcified granuloma within the left lung  base. The visualized lung bases are otherwise clear. Pacemaker leads are seen within the right atrium and right ventricle. Global cardiac size is mildly enlarged. No pericardial effusion. Hepatobiliary: No focal liver abnormality is seen. No gallstones, gallbladder wall thickening, or biliary dilatation. Pancreas: Unremarkable Spleen: Unremarkable Adrenals/Urinary Tract: Adrenal glands are unremarkable. Kidneys are normal, without renal calculi, focal lesion, or hydronephrosis. Bladder is unremarkable. Stomach/Bowel: Extensive inflammatory changes involve the sigmoid flexure of the colon and descending colon with milder changes involving the sigmoid colon and ascending colon identified. These all appear improved since prior examination may simply represent the residua of the given history of a pseudomembranous colitis. Extensive asymmetric inflammatory changes within the splenic flexure, however, also  raise the question of a ischemic colitis given its watershed distribution. Moderate to severe pancolonic diverticulosis is again noted, most severe within the sigmoid colon. A complex supraumbilical ventral hernia is again identified with 4 separate hernia sacs containing multiple loops of Facey and small bowel. The stomach, small bowel, and Mcarthy bowel are otherwise unremarkable. No evidence of obstruction. Appendix normal. No free intraperitoneal gas or fluid. Vascular/Lymphatic: Mild aortoiliac atherosclerotic calcification. No aortic aneurysm. No pathologic adenopathy within the abdomen and pelvis. Reproductive: Status post hysterectomy. No adnexal masses. Other: The rectum is unremarkable. Musculoskeletal: Degenerative changes are seen within the a lumbar spine. No lytic or blastic bone lesions are seen. IMPRESSION: Improving inflammatory changes involving the colon with residual inflammatory change identified involving the ascending colon and distal colon, most severe within the splenic flexure. While this most likely represents the residua of prior reported pseudomembranous colitis, ischemic colitis could be considered given the watershed distribution of the area of primary inflammation, particularly if there is been a history of significant hypotension. No evidence of a obstruction or perforation. Complex supraumbilical ventral hernia with at least 4 separate hernia sacs containing multiple loops of Walmer and small bowel. No evidence of obstruction. Pancolonic diverticulosis, severe within the sigmoid colon. Aortic Atherosclerosis (ICD10-I70.0). Electronically Signed   By: Fidela Salisbury MD   On: 09/27/2020 02:19   CT ABDOMEN PELVIS WO CONTRAST  Result Date: 09/01/2020 CLINICAL DATA:  Abdominal distension, pain EXAM: CT ABDOMEN AND PELVIS WITHOUT CONTRAST TECHNIQUE: Multidetector CT imaging of the abdomen and pelvis was performed following the standard protocol without IV contrast. COMPARISON:   08/30/2020 FINDINGS: Lower chest: Pacer wires noted in the right heart. Trace bilateral effusions and dependent/bibasilar atelectasis. Calcified granuloma in the left lung base. Hepatobiliary: No focal hepatic abnormality. Gallbladder unremarkable. Pancreas: No focal abnormality or ductal dilatation. Spleen: Calcifications throughout.  Normal size. Adrenals/Urinary Tract: No adrenal abnormality. No focal renal abnormality. No stones or hydronephrosis. Urinary bladder is unremarkable. Stomach/Bowel: Diffuse colonic wall thickening again noted, similar to prior study compatible with pancolitis. Diffuse sigmoid diverticulosis. Stomach and small bowel decompressed, unremarkable. Vascular/Lymphatic: Scattered calcifications. No evidence of aneurysm or adenopathy. Reproductive: Prior hysterectomy.  No adnexal masses. Other: No free fluid or free air. Musculoskeletal: Supraumbilical ventral hernia noted containing fat, stable. Just inferior to this hernia are 2 adjacent Seiter ventral hernias containing portions of the transverse colon. Inferior to this is a Quaranta paraumbilical hernia containing multiple small bowel loops. No evidence of bowel obstruction. Appearance of the hernias and containing bowel are unchanged since prior study. IMPRESSION: Multiple ventral hernias as described above, containing portions of the transverse colon and multiple small bowel loops. No evidence of bowel obstruction. Continued wall thickening throughout the colon compatible with pancolitis. Sigmoid diverticulosis. Old granulomatous disease. Trace bilateral effusions. Electronically  Signed   By: Rolm Baptise M.D.   On: 09/01/2020 11:45        The results of significant diagnostics from this hospitalization (including imaging, microbiology, ancillary and laboratory) are listed below for reference.     Microbiology: Recent Results (from the past 240 hour(s))  Culture, blood (Routine X 2) w Reflex to ID Panel     Status: None  (Preliminary result)   Collection Time: 09/27/20  1:34 AM   Specimen: BLOOD  Result Value Ref Range Status   Specimen Description BLOOD RIGHT ANTECUBITAL  Final   Special Requests   Final    BOTTLES DRAWN AEROBIC AND ANAEROBIC Blood Culture adequate volume   Culture   Final    NO GROWTH 3 DAYS Performed at [facility], [address] [place], [place] [address]    Report Status PENDING  Incomplete  Gastrointestinal Panel by PCR , Stool     Status: None   Collection Time: 09/27/20  1:34 AM   Specimen: Stool  Result Value Ref Range Status   Campylobacter species NOT DETECTED NOT DETECTED Final   Plesimonas shigelloides NOT DETECTED NOT DETECTED Final   Salmonella species NOT DETECTED NOT DETECTED Final   Yersinia enterocolitica NOT DETECTED NOT DETECTED Final   Vibrio species NOT DETECTED NOT DETECTED Final   Vibrio cholerae NOT DETECTED NOT DETECTED Final   Enteroaggregative E coli (EAEC) NOT DETECTED NOT DETECTED Final   Enteropathogenic E coli (EPEC) NOT DETECTED NOT DETECTED Final   Enterotoxigenic E coli (ETEC) NOT DETECTED NOT DETECTED Final   Shiga like toxin producing E coli (STEC) NOT DETECTED NOT DETECTED Final   Shigella/Enteroinvasive E coli (EIEC) NOT DETECTED NOT DETECTED Final   Cryptosporidium NOT DETECTED NOT DETECTED Final   Cyclospora cayetanensis NOT DETECTED NOT DETECTED Final   Entamoeba histolytica NOT DETECTED NOT DETECTED Final   Giardia lamblia NOT DETECTED NOT DETECTED Final   Adenovirus F40/41 NOT DETECTED NOT DETECTED Final   Astrovirus NOT DETECTED NOT DETECTED Final   Norovirus GI/GII NOT DETECTED NOT DETECTED Final   Rotavirus A NOT DETECTED NOT DETECTED Final   Sapovirus (I, II, IV, and V) NOT DETECTED NOT DETECTED Final    Comment: Performed at [facility], [address] [place], [place] [address]  C Difficile Quick Screen w PCR reflex     Status: Abnormal   Collection Time: 09/27/20  1:34 AM   Specimen: Stool  Result  Value Ref Range Status   C Diff antigen POSITIVE (A) NEGATIVE Final   C Diff toxin POSITIVE (A) NEGATIVE Final   C Diff interpretation Toxin producing C. difficile detected.  Final    Comment: RESULT CALLED TO, READ BACK BY AND VERIFIED WITH: AUSTIN REEVES 09/27/20 0606 LFD Performed at [facility], [address] [place], [place] [address]   Resp Panel by RT-PCR (Flu A&B, Covid) Nasopharyngeal Swab     Status: None   Collection Time: 09/27/20  1:34 AM   Specimen: Nasopharyngeal Swab; Nasopharyngeal(NP) swabs in vial transport medium  Result Value Ref Range Status   SARS Coronavirus 2 by RT PCR NEGATIVE NEGATIVE Final    Comment: (NOTE) SARS-CoV-2 target nucleic acids are NOT DETECTED.  The SARS-CoV-2 RNA is generally detectable in upper respiratory specimens during the acute phase of infection. The lowest concentration of SARS-CoV-2 viral copies this assay can detect is 138 copies/mL. A negative result does not preclude SARS-Cov-2 infection and should not be used as the sole basis for treatment  or other patient management decisions. A negative result may occur with  improper specimen collection/handling, submission of specimen other than nasopharyngeal swab, presence of viral mutation(s) within the areas targeted by this assay, and inadequate number of viral copies(<138 copies/mL). A negative result must be combined with clinical observations, patient history, and epidemiological information. The expected result is Negative.  Fact Sheet for Patients:  EntrepreneurPulse.com.au  Fact Sheet for Healthcare Providers:  IncredibleEmployment.be  This test is no t yet approved or cleared by the [place] FDA and  has been authorized for detection and/or diagnosis of SARS-CoV-2 by FDA under an Emergency Use Authorization (EUA). This EUA will remain  in effect (meaning this test can be used) for the duration of the COVID-19 declaration  under Section 564(b)(1) of the Act, 21 U.S.C.section 360bbb-3(b)(1), unless the authorization is terminated  or revoked sooner.       Influenza A by PCR NEGATIVE NEGATIVE Final   Influenza B by PCR NEGATIVE NEGATIVE Final    Comment: (NOTE) The Xpert Xpress SARS-CoV-2/FLU/RSV plus assay is intended as an aid in the diagnosis of influenza from Nasopharyngeal swab specimens and should not be used as a sole basis for treatment. Nasal washings and aspirates are unacceptable for Xpert Xpress SARS-CoV-2/FLU/RSV testing.  Fact Sheet for Patients: EntrepreneurPulse.com.au  Fact Sheet for Healthcare Providers: IncredibleEmployment.be  This test is not yet approved or cleared by the [place] FDA and has been authorized for detection and/or diagnosis of SARS-CoV-2 by FDA under an Emergency Use Authorization (EUA). This EUA will remain in effect (meaning this test can be used) for the duration of the COVID-19 declaration under Section 564(b)(1) of the Act, 21 U.S.C. section 360bbb-3(b)(1), unless the authorization is terminated or revoked.  Performed at [facility], [address] [place], [place] [address]   Culture, blood (Routine X 2) w Reflex to ID Panel     Status: None (Preliminary result)   Collection Time: 09/27/20  1:41 AM   Specimen: BLOOD  Result Value Ref Range Status   Specimen Description BLOOD LEFT ANTECUBITAL  Final   Special Requests   Final    BOTTLES DRAWN AEROBIC AND ANAEROBIC Blood Culture results may not be optimal due to an excessive volume of blood received in culture bottles   Culture   Final    NO GROWTH 3 DAYS Performed at [facility], [address] [place], [place] [address]    Report Status PENDING  Incomplete     Labs:  CBC: Recent Labs  Lab 09/27/20 0113 09/28/20 0632 09/29/20 0546 09/30/20 0455  WBC 14.9* 8.0 6.2 5.3  NEUTROABS 12.0*  --   --   --   HGB 12.2 11.0* 11.1* 10.9*   HCT 36.8 33.1* 33.3* 32.7*  MCV 101.1* 100.9* 100.3* 99.1  PLT 234 187 205 202   BMP &GFR Recent Labs  Lab 09/27/20 0113 09/27/20 1153 09/28/20 0632 09/29/20 0546 09/30/20 0455  NA 136  --  138 137 137  K 3.4*  --  3.7 4.2 3.9  CL 102  --  104 104 106  CO2 25  --  27 27 27   GLUCOSE 112*  --  92 93 91  BUN 16  --  15 20 20   CREATININE 0.77  --  0.83 0.88 0.73  CALCIUM 8.9  --  8.5* 8.5* 8.4*  MG  --  1.6*  --  2.2 2.0   Estimated Creatinine Clearance: 92.4 mL/min (by C-G formula based on SCr of  0.73 mg/dL). Liver & Pancreas: Recent Labs  Lab 09/27/20 0113  AST 21  ALT 15  ALKPHOS 76  BILITOT 0.9  PROT 6.4*  ALBUMIN 3.5   Recent Labs  Lab 09/27/20 0113  LIPASE 27   No results for input(s): AMMONIA in the last 168 hours. Diabetic: No results for input(s): HGBA1C in the last 72 hours. No results for input(s): GLUCAP in the last 168 hours. Cardiac Enzymes: No results for input(s): CKTOTAL, CKMB, CKMBINDEX, TROPONINI in the last 168 hours. No results for input(s): PROBNP in the last 8760 hours. Coagulation Profile: Recent Labs  Lab 09/27/20 1153  INR 1.2   Thyroid Function Tests: No results for input(s): TSH, T4TOTAL, FREET4, T3FREE, THYROIDAB in the last 72 hours. Lipid Profile: No results for input(s): CHOL, HDL, LDLCALC, TRIG, CHOLHDL, LDLDIRECT in the last 72 hours. Anemia Panel: Recent Labs    09/29/20 0546  VITAMINB12 334   Urine analysis:    Component Value Date/Time   COLORURINE YELLOW (A) 08/31/2020 1628   APPEARANCEUR CLEAR (A) 08/31/2020 1628   LABSPEC 1.015 08/31/2020 1628   PHURINE 5.0 08/31/2020 1628   GLUCOSEU NEGATIVE 08/31/2020 1628   HGBUR NEGATIVE 08/31/2020 1628   BILIRUBINUR NEGATIVE 08/31/2020 1628   KETONESUR NEGATIVE 08/31/2020 1628   PROTEINUR NEGATIVE 08/31/2020 1628   NITRITE NEGATIVE 08/31/2020 1628   LEUKOCYTESUR NEGATIVE 08/31/2020 1628   Sepsis Labs: Invalid input(s): PROCALCITONIN, LACTICIDVEN   Time  coordinating discharge: 45 minutes  SIGNED:  Mercy Riding, MD  [facility] 09/30/2020, 4:50 PM  If 7PM-7AM, please contact night-coverage [email or web address]

## 2020-09-30 NOTE — Progress Notes (Signed)
PT Cancellation Note  Patient Details Name: [name] [name] MRN: [id number] DOB: Jun 27, 1955   Cancelled Treatment:    Reason Eval/Treat Not Completed: PT screened, no needs identified, will sign off (Consult received and chart reviewed. Patient up in room, participating with grooming, oral care and packing belongings upon arrival to room.  Appears safe and steady with observed mobility; no assistive device required.  Patient does endorse generalized weakness due to acute illness, but voices noted improvement since admission. Reports feeling comfortable with mobility necessary for safe discharge home and denies need for acute PT evaluation.  Verbally discussed strategies for activity pacing, energy conservation and recognition/awareness of signs/symptoms of fatigue; patient voices understanding with all information.  Does report owning 4WR[employer] that is available at home as needed, and reports outpatient PT appointment (for previous R knee injury) set up for this coming Friday (10/02/20).  Will complete initial PT order at this time, as no acute PT needs identified.  Please re-consult should needs change prior to discharge.)  Parys Elenbaas H. Owens Shark, PT, DPT, NCS 09/30/20, 11:42 AM [phone]

## 2020-09-30 NOTE — Plan of Care (Signed)
End of shift summary: Patient slept comfortably throughout the night. Denies pain. Remained free from falls. No bowel movements overnight. VSS. No acute medical complaints at this time.   Problem: Education: Goal: Knowledge of General Education information will improve Description: Including pain rating scale, medication(s)/side effects and non-pharmacologic comfort measures Outcome: Progressing   Problem: Health Behavior/Discharge Planning: Goal: Ability to manage health-related needs will improve Outcome: Progressing   Problem: Clinical Measurements: Goal: Ability to maintain clinical measurements within normal limits will improve Outcome: Progressing Goal: Will remain free from infection Outcome: Progressing Goal: Diagnostic test results will improve Outcome: Progressing Goal: Respiratory complications will improve Outcome: Progressing Goal: Cardiovascular complication will be avoided Outcome: Progressing   Problem: Activity: Goal: Risk for activity intolerance will decrease Outcome: Progressing   Problem: Nutrition: Goal: Adequate nutrition will be maintained Outcome: Progressing   Problem: Coping: Goal: Level of anxiety will decrease Outcome: Progressing   Problem: Elimination: Goal: Will not experience complications related to bowel motility Outcome: Progressing Goal: Will not experience complications related to urinary retention Outcome: Progressing   Problem: Pain Managment: Goal: General experience of comfort will improve Outcome: Progressing   Problem: Safety: Goal: Ability to remain free from injury will improve Outcome: Progressing   Problem: Skin Integrity: Goal: Risk for impaired skin integrity will decrease Outcome: Progressing

## 2020-09-30 NOTE — Progress Notes (Signed)
Pharmacy - Antimicrobial Stewardship  Patient with recurrent C. Difficile infection started on fidaxomicin by ID.  She has Worker's Compensation case and following several phone calls to [employer], I was able to get approval for Dificid for $0.    Medication was delivered to her room and reviewed how to take the medication with her (take 1 tab BID for 5 days than one tablet every other day).  Also provided calendars to assist with compliance with above dosing regimen.    Doreene Eland, PharmD, BCPS.   Work Cell: [phone] 09/30/2020 10:41 AM

## 2020-10-03 LAB — CULTURE, BLOOD (ROUTINE X 2)
Culture: NO GROWTH
Culture: NO GROWTH
Special Requests: ADEQUATE

## 2021-04-08 ENCOUNTER — Other Ambulatory Visit: Payer: Self-pay

## 2021-04-27 ENCOUNTER — Other Ambulatory Visit: Payer: Self-pay

## 2021-04-29 ENCOUNTER — Other Ambulatory Visit: Payer: Self-pay

## 2021-05-13 ENCOUNTER — Ambulatory Visit
Admission: EM | Admit: 2021-05-13 | Discharge: 2021-05-13 | Disposition: A | Discharge status: Home / Self Care | Payer: BC Managed Care – PPO | Attending: Emergency Medicine | Admitting: Emergency Medicine

## 2021-05-13 ENCOUNTER — Other Ambulatory Visit: Payer: Self-pay

## 2021-05-13 DIAGNOSIS — U071 COVID-19: Secondary | ICD-10-CM | POA: Diagnosis not present

## 2021-05-13 LAB — RESP PANEL BY RT-PCR (FLU A&B, COVID) ARPGX2
Influenza A by PCR: NEGATIVE
Influenza B by PCR: NEGATIVE
SARS Coronavirus 2 by RT PCR: POSITIVE — AB

## 2021-05-13 MED ORDER — BENZONATATE 100 MG PO CAPS: 200.0000 mg | ORAL_CAPSULE | Freq: Three times a day (TID) | ORAL | 0 refills | Status: AC

## 2021-05-13 MED ORDER — MOLNUPIRAVIR EUA 200MG CAPSULE: 4.0000 | ORAL_CAPSULE | Freq: Two times a day (BID) | ORAL | 0 refills | Status: AC

## 2021-05-13 MED ORDER — PROMETHAZINE-DM 6.25-15 MG/5ML PO SYRP: 5.0000 mL | ORAL_SOLUTION | Freq: Four times a day (QID) | ORAL | 0 refills | Status: AC | PRN

## 2021-05-13 MED ORDER — IPRATROPIUM BROMIDE 0.06 % NA SOLN: 2.0000 | Freq: Four times a day (QID) | NASAL | 12 refills | Status: AC

## 2021-05-13 NOTE — Discharge Instructions (Addendum)
You will have to quarantine for 5 days from the start of your symptoms.  After 5 days you can break quarantine if your symptoms have improved and you have not had a fever for 24 hours without taking Tylenol or ibuprofen.  Use over-the-counter Tylenol and ibuprofen as needed for body aches and fever.  Use the Atrovent nasal spray, 2 squirts in each nostril every 6 hours, as needed for runny nose and postnasal drip.  Use the Tessalon Perles every 8 hours during the day.  Take them with a small sip of water.  They may give you some numbness to the base of your tongue or a metallic taste in your mouth, this is normal.  Use the Promethazine DM cough syrup at bedtime for cough and congestion.  It will make you drowsy so do not take it during the day.  Take the molnupiravir twice daily for treatment of COVID-19 for the next 5 days.  If you develop any increased shortness of breath-especially at rest, you are unable to speak in full sentences, or is a late sign your lips are turning blue you need to go the ER for evaluation.

## 2021-05-13 NOTE — ED Triage Notes (Signed)
Pt presents cough, wheezing, runny nose, HA x 1 day.  Nauseated starting today. Tylenol helped today.   Needs work note.

## 2021-05-13 NOTE — ED Provider Notes (Signed)
[facility]    CSN: [id number] Arrival date & time: 05/13/21  1431      History   Chief Complaint Chief Complaint  Patient presents with   Cough   Wheezing    HPI [name] [name] is a 66 y.o. female.   HPI  66 year old female here for evaluation of respiratory complaints.  Patient ports that her symptoms started last night with a scratchy throat.  This morning she woke up she had a headache, runny nose with clear nasal discharge, ear pain, nonproductive cough, wheezing, and nausea.  She denies any chills, shortness of breath, vomiting or diarrhea, or body aches.  She states that she thought her nausea was coming from drainage and she was able to eat breakfast.  The nausea intensified around lunchtime but she ate a quarter pounder with cheese from [employer] without difficulty.  Patient is requesting work note  Past Medical History:  Diagnosis Date   Anemia    Arrhythmia    Atrial fibrillation ([place])    Cancer ([place])    Endometriosis    GERD (gastroesophageal reflux disease)    Gout    Heart disease    Hypothyroidism    Obesity    Prediabetes    Renal disorder    Ventricular hypertrophy     Patient Active Problem List   Diagnosis Date Noted   C. difficile colitis 09/27/2020   Atrial fibrillation ([place])    Gout    Hypothyroidism    HTN (hypertension)    Hypokalemia    Septic shock ([place]) 08/31/2020    Past Surgical History:  Procedure Laterality Date   ABDOMINAL HYSTERECTOMY     ADENOIDECTOMY     DILATION AND CURETTAGE OF UTERUS     TONSILLECTOMY      OB History   No obstetric history on file.      Home Medications    Prior to Admission medications   Medication Sig Start Date End Date Taking? Authorizing Provider  allopurinol (ZYLOPRIM) 100 MG tablet Take 100 mg by mouth 2 (two) times daily.   Yes [provider]  benzonatate (TESSALON) 100 MG capsule Take 2 capsules (200 mg total) by mouth every 8 (eight) hours. 05/13/21  Yes  Margarette Canada, NP  carvedilol (COREG) 12.5 MG tablet Take 1 tablet (12.5 mg total) by mouth 2 (two) times daily. 09/11/20 05/13/21 Yes Dahal, Marlowe Aschoff, MD  ELIQUIS 5 MG TABS tablet Take 1 tablet by mouth 2 (two) times daily. 08/08/20  Yes [provider]  flecainide (TAMBOCOR) 100 MG tablet Take 100 mg by mouth 2 (two) times daily. 08/13/20  Yes [provider]  furosemide (LASIX) 20 MG tablet Take 1 tablet (20 mg total) by mouth daily. 10/03/20 05/13/21 Yes Mercy Riding, MD  hydrALAZINE (APRESOLINE) 25 MG tablet Take 1 tablet (25 mg total) by mouth 3 (three) times daily. 09/30/20 05/13/21 Yes Mercy Riding, MD  ipratropium (ATROVENT) 0.06 % nasal spray Place 2 sprays into both nostrils 4 (four) times daily. 05/13/21  Yes Margarette Canada, NP  levothyroxine (SYNTHROID, LEVOTHROID) 175 MCG tablet Take 175 mcg by mouth daily before breakfast.   Yes [provider]  losartan (COZAAR) 100 MG tablet Take 1 tablet (100 mg total) by mouth daily. 09/11/20 05/13/21 Yes Dahal, Marlowe Aschoff, MD  molnupiravir EUA (LAGEVRIO) 200 mg CAPS capsule Take 4 capsules (800 mg total) by mouth 2 (two) times daily for 5 days. 05/13/21 05/18/21 Yes Margarette Canada, NP  Potassium Chloride Crys ER (K-DUR  PO) Take by mouth.   Yes [provider]  promethazine-dextromethorphan (PROMETHAZINE-DM) 6.25-15 MG/5ML syrup Take 5 mLs by mouth 4 (four) times daily as needed. 05/13/21  Yes Margarette Canada, NP  colchicine 0.6 MG tablet Take 0.6 mg by mouth as needed.    [provider]    Family History Family History  Problem Relation Age of Onset   Diabetes Mother    Hypertension Mother    Heart attack Father     Social History Social History   Tobacco Use   Smoking status: Never   Smokeless tobacco: Never  Vaping Use   Vaping Use: Never used  Substance Use Topics   Alcohol use: No   Drug use: No     Allergies   Amlodipine, Penicillins, and Tape   Review of Systems Review of Systems  Constitutional:   Negative for fever.  HENT:  Positive for congestion, ear pain, rhinorrhea and sore throat.   Respiratory:  Positive for cough and wheezing. Negative for shortness of breath.   Gastrointestinal:  Positive for nausea. Negative for diarrhea and vomiting.  Musculoskeletal:  Negative for arthralgias and myalgias.  Skin:  Negative for rash.  Neurological:  Positive for headaches.  Hematological: Negative.   Psychiatric/Behavioral: Negative.      Physical Exam Triage Vital Signs ED Triage Vitals  Enc Vitals Group     BP 05/13/21 1445 (!) 167/67     Pulse Rate 05/13/21 1445 (!) 58     Resp 05/13/21 1445 20     Temp 05/13/21 1445 100 F (37.8 C)     Temp Source 05/13/21 1445 Oral     SpO2 05/13/21 1445 97 %     Weight --      Height --      Head Circumference --      Peak Flow --      Pain Score 05/13/21 1443 0     Pain Loc --      Pain Edu? --      Excl. in [place]? --    No data found.  Updated Vital Signs BP (!) 167/67 (BP Location: Left Arm)    Pulse (!) 58    Temp 100 F (37.8 C) (Oral)    Resp 20    SpO2 97%   Visual Acuity Right Eye Distance:   Left Eye Distance:   Bilateral Distance:    Right Eye Near:   Left Eye Near:    Bilateral Near:     Physical Exam Vitals and nursing note reviewed.  Constitutional:      General: She is not in acute distress.    Appearance: Normal appearance. She is not ill-appearing.  HENT:     Head: Normocephalic and atraumatic.     Right Ear: Tympanic membrane, ear canal and external ear normal. There is no impacted cerumen.     Left Ear: Tympanic membrane, ear canal and external ear normal. There is no impacted cerumen.     Nose: Congestion and rhinorrhea present.     Mouth/Throat:     Mouth: Mucous membranes are moist.     Pharynx: Oropharynx is clear. Posterior oropharyngeal erythema present.  Cardiovascular:     Rate and Rhythm: Normal rate and regular rhythm.     Pulses: Normal pulses.     Heart sounds: Normal heart sounds. No  murmur heard.   No friction rub. No gallop.  Pulmonary:     Effort: Pulmonary effort is normal.     Breath  sounds: Normal breath sounds. No wheezing, rhonchi or rales.  Musculoskeletal:     Cervical back: Normal range of motion and neck supple.  Lymphadenopathy:     Cervical: No cervical adenopathy.  Skin:    General: Skin is warm and dry.     Capillary Refill: Capillary refill takes less than 2 seconds.     Findings: No erythema or rash.  Neurological:     General: No focal deficit present.     Mental Status: She is alert and oriented to person, place, and time.  Psychiatric:        Mood and Affect: Mood normal.        Behavior: Behavior normal.        Thought Content: Thought content normal.        Judgment: Judgment normal.     UC Treatments / Results  Labs (all labs ordered are listed, but only abnormal results are displayed) Labs Reviewed  RESP PANEL BY RT-PCR (FLU A&B, COVID) [id number] - Abnormal; Notable for the following components:      Result Value   SARS Coronavirus 2 by RT PCR POSITIVE (*)    All other components within normal limits    EKG   Radiology No results found.  Procedures Procedures (including critical care time)  Medications Ordered in UC Medications - No data to display  Initial Impression / Assessment and Plan / UC Course  I have reviewed the triage vital signs and the nursing notes.  Pertinent labs & imaging results that were available during my care of the patient were reviewed by me and considered in my medical decision making (see chart for details).  Patient is a nontoxic-appearing 66 year old female here for evaluation of respiratory complaints as outlined in HPI above.  Her physical exam reveals pearly gray tympanic membranes bilaterally with normal light reflex and clear external auditory canals.  Nasal mucosa is mildly erythematous and edematous with scant clear discharge in both nares.  Oropharyngeal exam reveals mild posterior  oropharyngeal erythema with clear postnasal drip.  No cervical lymphadenopathy appreciated on exam.  Cardiopulmonary exam reveals clear lung sounds in all fields.  Respiratory triplex panel was collected in triage and is pending.  Respiratory tri-Plex panel is positive for COVID.  Patient is taking Eliquis and therefore she cannot be prescribed the Paxlovid so I will prescribe her molnupiravir twice daily for 5 days for treatment of COVID-19.  I will also give her Atrovent nasal spray to help with her nasal congestion, Tessalon Perles help with cough, and Promethazine DM cough syrup help with cough and congestion at bedtime.    Final Clinical Impressions(s) / UC Diagnoses   Final diagnoses:  [id number]     Discharge Instructions      You will have to quarantine for 5 days from the start of your symptoms.  After 5 days you can break quarantine if your symptoms have improved and you have not had a fever for 24 hours without taking Tylenol or ibuprofen.  Use over-the-counter Tylenol and ibuprofen as needed for body aches and fever.  Use the Atrovent nasal spray, 2 squirts in each nostril every 6 hours, as needed for runny nose and postnasal drip.  Use the Tessalon Perles every 8 hours during the day.  Take them with a small sip of water.  They may give you some numbness to the base of your tongue or a metallic taste in your mouth, this is normal.  Use the Promethazine DM cough syrup at  bedtime for cough and congestion.  It will make you drowsy so do not take it during the day.  Take the molnupiravir twice daily for treatment of COVID-19 for the next 5 days.  If you develop any increased shortness of breath-especially at rest, you are unable to speak in full sentences, or is a late sign your lips are turning blue you need to go the ER for evaluation.      ED Prescriptions     Medication Sig Dispense Auth. Provider   benzonatate (TESSALON) 100 MG capsule Take 2 capsules (200 mg total)  by mouth every 8 (eight) hours. 21 capsule Margarette Canada, NP   ipratropium (ATROVENT) 0.06 % nasal spray Place 2 sprays into both nostrils 4 (four) times daily. 15 mL Margarette Canada, NP   promethazine-dextromethorphan (PROMETHAZINE-DM) 6.25-15 MG/5ML syrup Take 5 mLs by mouth 4 (four) times daily as needed. 118 mL Margarette Canada, NP   molnupiravir EUA (LAGEVRIO) 200 mg CAPS capsule Take 4 capsules (800 mg total) by mouth 2 (two) times daily for 5 days. 40 capsule Margarette Canada, NP      PDMP not reviewed this encounter.   Margarette Canada, NP 05/13/21 1556

## 2022-01-25 IMAGING — CT CT ABD-PELV W/O CM
2 of 4 series · 15 of 46 positions shown, 17 images · non-contrast
Comparison: Chest x-ray 07/08/2014

CLINICAL DATA: Nonlocalized acute abdominal pain. Nausea vomiting
and diarrhea. Fever. Recent hospitalization with antibiotic use.

EXAM:
CT ABDOMEN AND PELVIS WITHOUT CONTRAST
TECHNIQUE: Multidetector CT imaging of the abdomen and pelvis was performed
following the standard protocol without IV contrast.

[Series 2: routine abd/pel wo · axial · 0.86mm/px · z∈[-371,+59]mm · 12 of 102 slices shown, 14 images]
[im 8/102  soft-tissue]
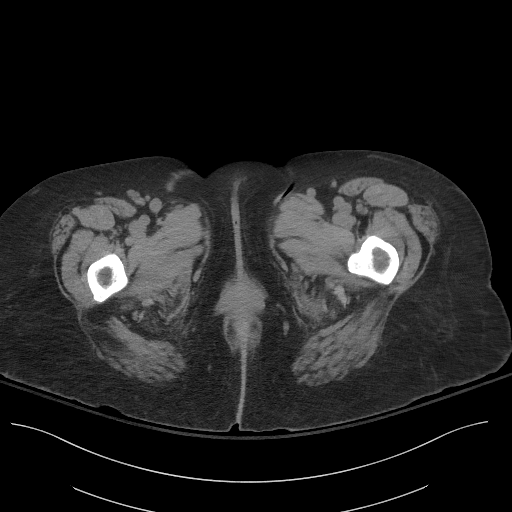
[im 8/102  bone]
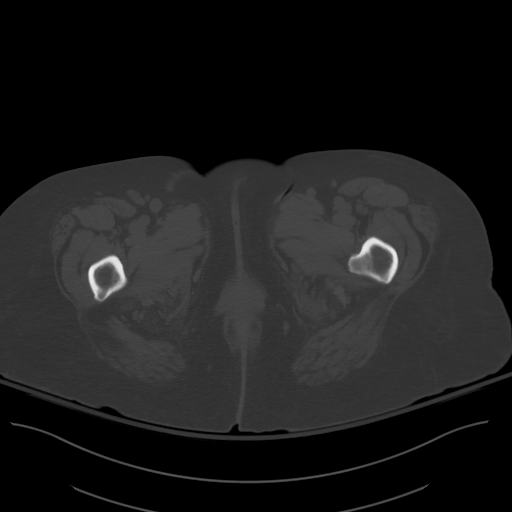
[im 16/102  soft-tissue]
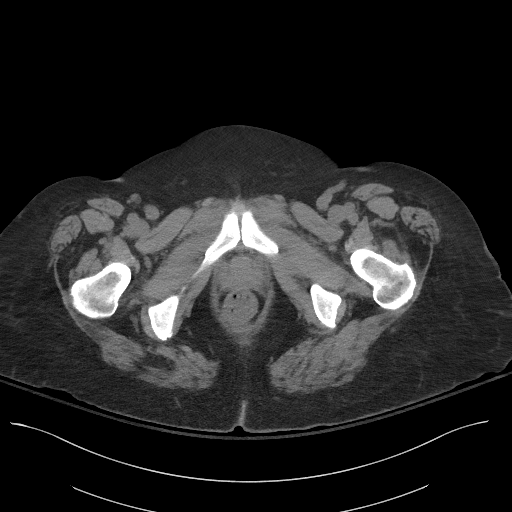
[im 24/102  soft-tissue]
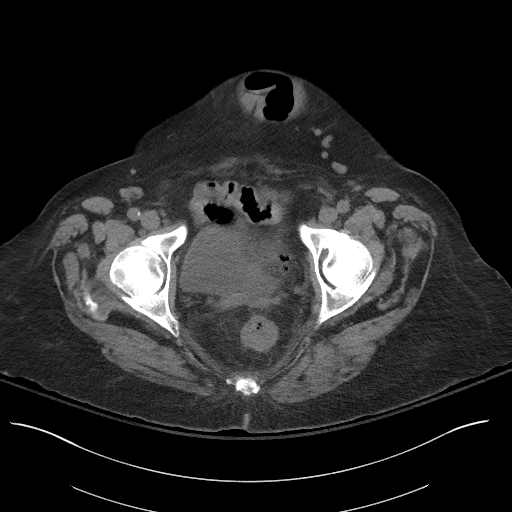
[im 32/102  soft-tissue]
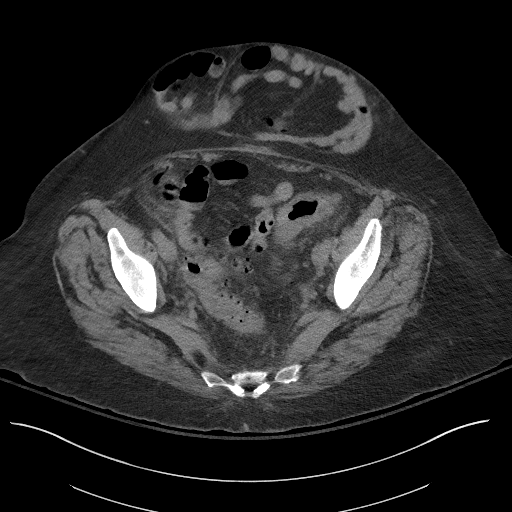
[im 39/102  soft-tissue]
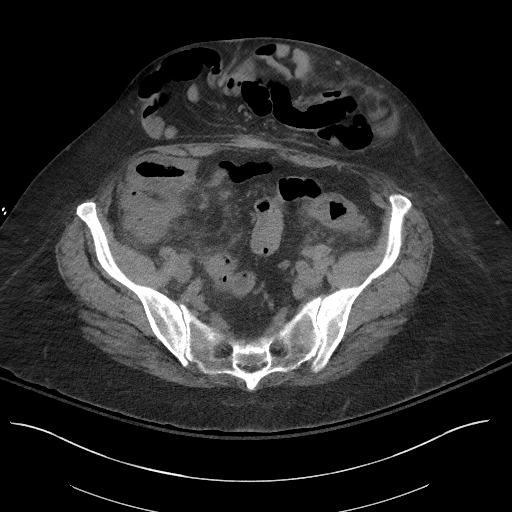
[im 47/102  soft-tissue]
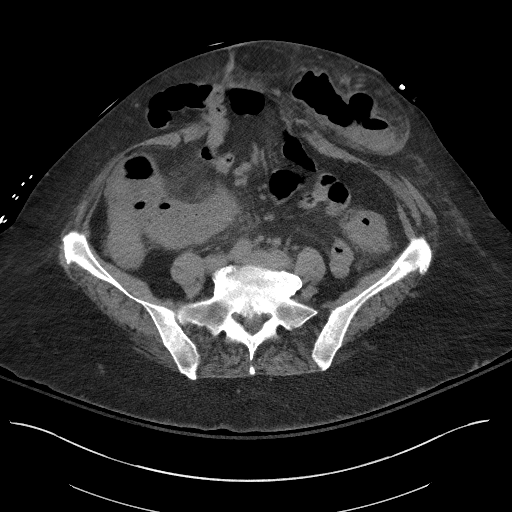
[im 55/102  soft-tissue]
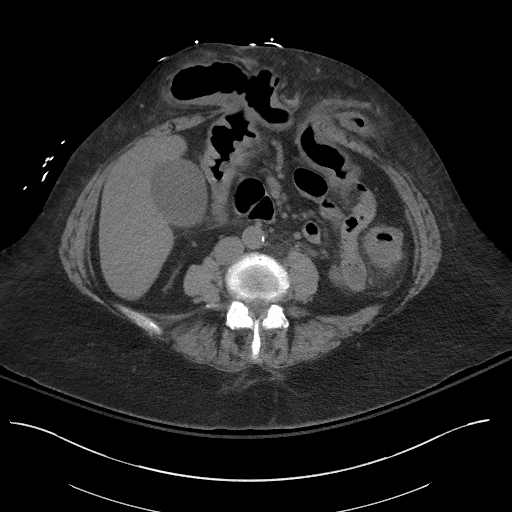
[im 63/102  soft-tissue]
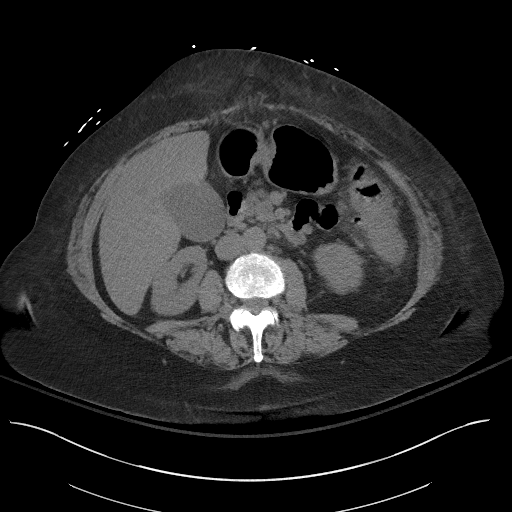
[im 70/102  soft-tissue]
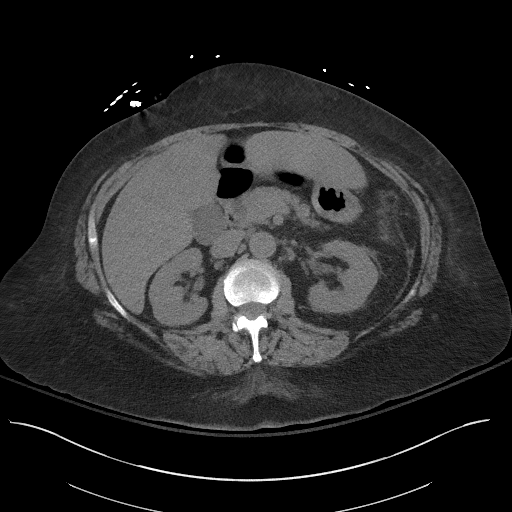
[im 70/102  bone]
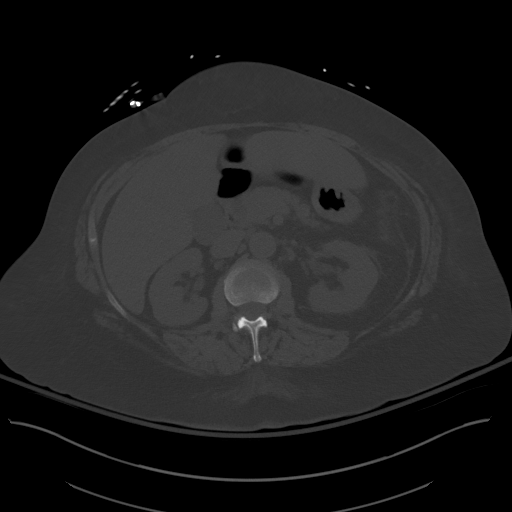
[im 78/102  soft-tissue]
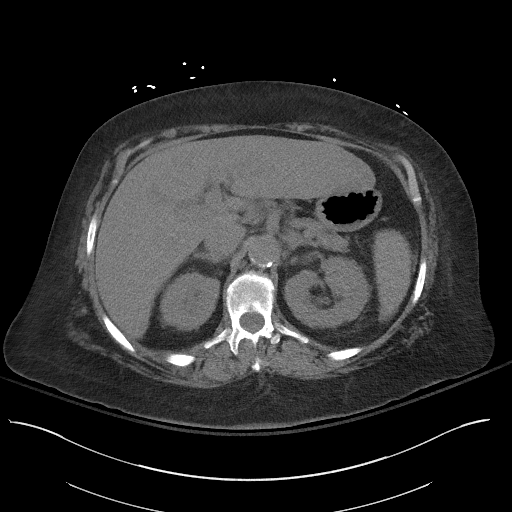
[im 86/102  soft-tissue]
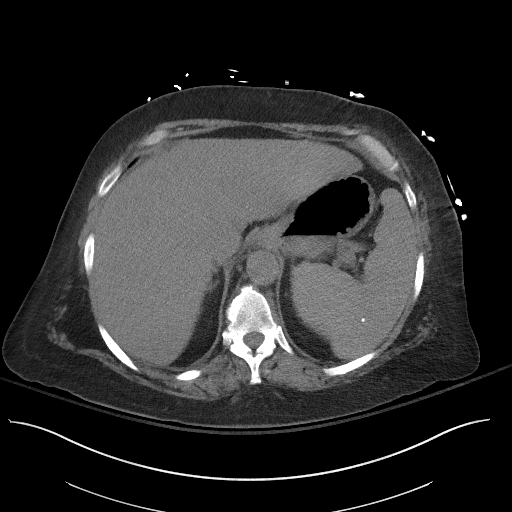
[im 94/102  soft-tissue]
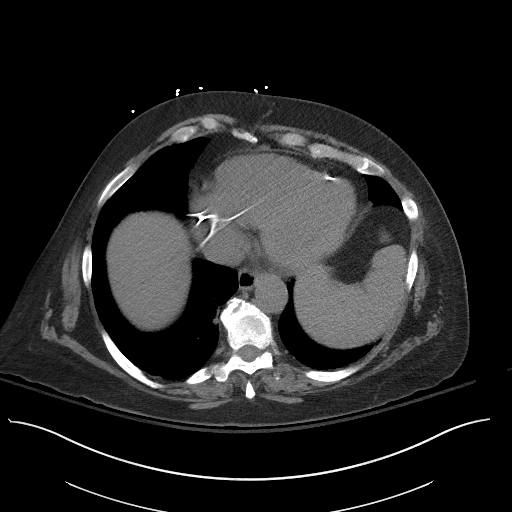

[Series 5: coronal st · coronal · 0.83mm/px · 3 of 116 slices shown]
[im 39/116  soft-tissue]
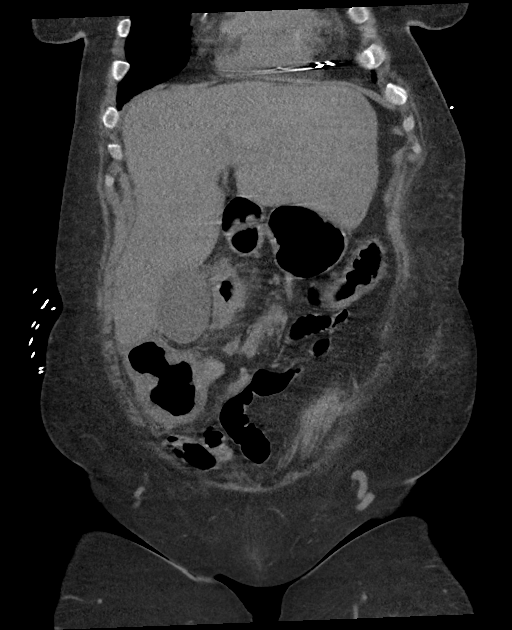
[im 52/116  soft-tissue]
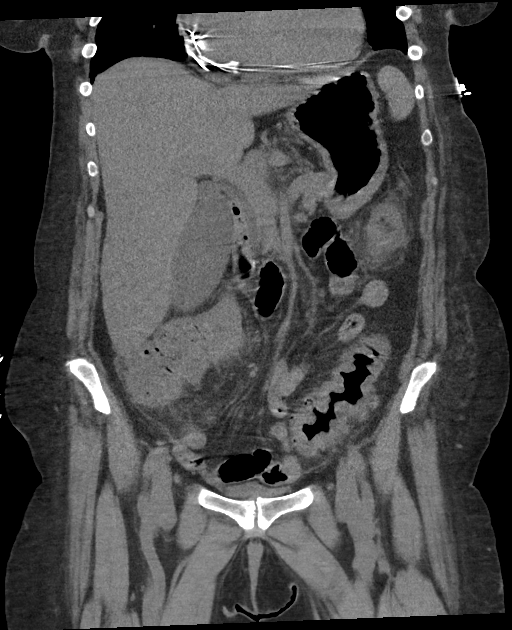
[im 64/116  soft-tissue]
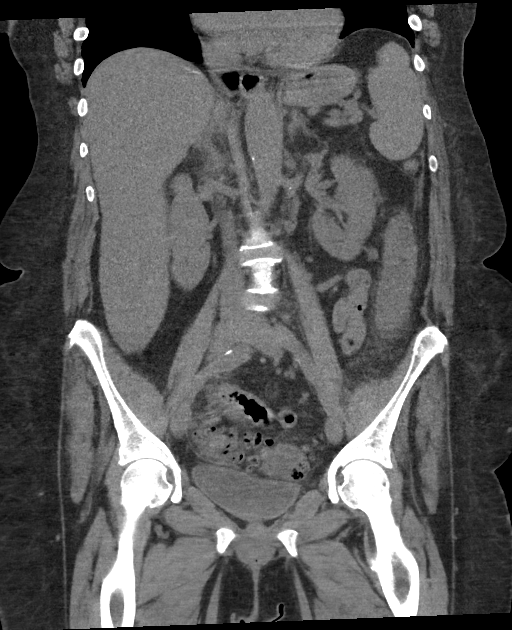

[15 of 46 positions shown; findings below may reference images not displayed]

FINDINGS: Lower chest: Passive atelectasis in the paravertebral right lower
lobe. Calcified left base 8 mm pulmonary nodule consistent with
known granuloma. Partially visualized cardiac leads.

Hepatobiliary: The liver is enlarged measuring up to 22 cm. No focal
liver abnormality. Nonspecific hydropic gallbladder. No gallstones,
gallbladder wall thickening, or pericholecystic fluid. No biliary
dilatation.

Pancreas: No focal lesion. Normal pancreatic contour. No surrounding
inflammatory changes. No main pancreatic ductal dilatation.

Spleen: Multiple punctate calcifications likely sequelae of prior
granulomatous disease. Normal in size without focal abnormality.

Adrenals/Urinary Tract:

No adrenal nodule bilaterally.

No nephrolithiasis, no hydronephrosis, and no contour-deforming
renal mass. No ureterolithiasis or hydroureter.

The urinary bladder is unremarkable.

Stomach/Bowel: Stomach is within normal limits. No evidence of small
bowel wall thickening or dilatation. Extensive circumferential bowel
wall thickening of the entire colon. Associated pericolonic fat
stranding. Diffuse left colon and sigmoid diverticulosis. The
appendix is not definitely identified.

Vascular/Lymphatic: No portal venous gas. No mesenteric gas. No
abdominal aorta or iliac aneurysm. Mild atherosclerotic plaque of
the aorta and its branches. No abdominal, pelvic, or inguinal
lymphadenopathy.

Reproductive: Borderline enlarged retroperitoneal lymph nodes with
no definite lymphadenopathy.

Other: No intraperitoneal free fluid. No intraperitoneal free gas.
No organized fluid collection.

Musculoskeletal:

Supraumbilical ventral wall hernia containing fat ([DATE], [DATE] with
an abdominal defect of 2.4 by 2.1 cm. Inferiorly there are two large
adjacent hernias containing contiguous loops of transverse colon
([DATE], [DATE]). The abdominal defects for these hernias measure
approximately 4.6 by 4.7 cm ([DATE], [DATE]) and 2.7 by 1.6 cm ([DATE],
[DATE]). Just inferiorly there is a large paraumbilical hernia
containing several loops of small bowel including the terminal ileum
with an abdominal defect of 7.4 x 5 cm ([DATE]). Mesenteric fat
stranding is noted within these hernias.
IMPRESSION: 1. Extensive pancolitis with no associated bowel perforation or
obstruction. No pneumatosis with however limited evaluation for
bowel ischemia on this noncontrast study. Recommend correlation with
stool cultures.
2. Total of 4 complex ventral wall hernias with the 3 most inferior
ones containing small and large bowel. Associated mesenteric fat
stranding. Associated ischemia not excluded. Consider correlation
with lactate levels.
3. Other imaging findings of potential clinical significance:
Hepatomegaly. Sequela of prior granulomatous disease. Aortic
Atherosclerosis ([id number]).

These results were called by telephone at the time of interpretation
on 08/30/2020 at [DATE] to provider QOMANDAN TIGER , who verbally
acknowledged these results.

## 2022-05-10 ENCOUNTER — Ambulatory Visit
Admission: EM | Admit: 2022-05-10 | Discharge: 2022-05-10 | Disposition: A | Discharge status: Home / Self Care | Payer: BC Managed Care – PPO | Source: Home / Self Care

## 2022-05-10 ENCOUNTER — Other Ambulatory Visit: Payer: Self-pay

## 2022-05-10 ENCOUNTER — Encounter: Payer: Self-pay | Admitting: Emergency Medicine

## 2022-05-10 ENCOUNTER — Emergency Department: Payer: BC Managed Care – PPO

## 2022-05-10 ENCOUNTER — Ambulatory Visit (INDEPENDENT_AMBULATORY_CARE_PROVIDER_SITE_OTHER): Payer: BC Managed Care – PPO

## 2022-05-10 ENCOUNTER — Emergency Department
Admission: EM | Admit: 2022-05-10 | Discharge: 2022-05-10 | Disposition: A | Discharge status: Other Acute Inpatient Hospital | Payer: BC Managed Care – PPO | Attending: Emergency Medicine | Admitting: Emergency Medicine

## 2022-05-10 DIAGNOSIS — R109 Unspecified abdominal pain: Secondary | ICD-10-CM | POA: Diagnosis present

## 2022-05-10 DIAGNOSIS — R1084 Generalized abdominal pain: Secondary | ICD-10-CM | POA: Insufficient documentation

## 2022-05-10 DIAGNOSIS — K56609 Unspecified intestinal obstruction, unspecified as to partial versus complete obstruction: Secondary | ICD-10-CM

## 2022-05-10 DIAGNOSIS — R112 Nausea with vomiting, unspecified: Secondary | ICD-10-CM | POA: Insufficient documentation

## 2022-05-10 DIAGNOSIS — Z20822 Contact with and (suspected) exposure to covid-19: Secondary | ICD-10-CM | POA: Diagnosis not present

## 2022-05-10 LAB — COMPREHENSIVE METABOLIC PANEL
ALT: 17 U/L (ref 0–44)
AST: 27 U/L (ref 15–41)
Albumin: 3.7 g/dL (ref 3.5–5.0)
Alkaline Phosphatase: 78 U/L (ref 38–126)
Anion gap: 8 (ref 5–15)
BUN: 18 mg/dL (ref 8–23)
CO2: 26 mmol/L (ref 22–32)
Calcium: 9 mg/dL (ref 8.9–10.3)
Chloride: 100 mmol/L (ref 98–111)
Creatinine, Ser: 1.03 mg/dL — ABNORMAL HIGH (ref 0.44–1.00)
GFR, Estimated: >60 mL/min (ref >60)
Glucose, Bld: 125 mg/dL — ABNORMAL HIGH (ref 70–99)
Potassium: 4 mmol/L (ref 3.5–5.1)
Sodium: 134 mmol/L — ABNORMAL LOW (ref 135–145)
Total Bilirubin: 0.7 mg/dL (ref 0.3–1.2)
Total Protein: 6.8 g/dL (ref 6.5–8.1)

## 2022-05-10 LAB — CBC WITH DIFFERENTIAL/PLATELET
Abs Immature Granulocytes: 0.05 10*3/uL (ref 0.00–0.07)
Basophils Absolute: 0 10*3/uL (ref 0.0–0.1)
Basophils Relative: 0 %
Eosinophils Absolute: 0.1 10*3/uL (ref 0.0–0.5)
Eosinophils Relative: 1 %
HCT: 40.2 % (ref 36.0–46.0)
Hemoglobin: 13.6 g/dL (ref 12.0–15.0)
Immature Granulocytes: 1 %
Lymphocytes Relative: 9 %
Lymphs Abs: 0.8 10*3/uL (ref 0.7–4.0)
MCH: 33.2 pg (ref 26.0–34.0)
MCHC: 33.8 g/dL (ref 30.0–36.0)
MCV: 98 fL (ref 80.0–100.0)
Monocytes Absolute: 0.8 10*3/uL (ref 0.1–1.0)
Monocytes Relative: 8 %
Neutro Abs: 8 10*3/uL — ABNORMAL HIGH (ref 1.7–7.7)
Neutrophils Relative %: 81 %
Platelets: 214 10*3/uL (ref 150–400)
RBC: 4.1 MIL/uL (ref 3.87–5.11)
RDW: 13.2 % (ref 11.5–15.5)
WBC: 9.7 10*3/uL (ref 4.0–10.5)
nRBC: 0 % (ref 0.0–0.2)

## 2022-05-10 LAB — RESP PANEL BY RT-PCR (RSV, FLU A&B, COVID)  RVPGX2
Influenza A by PCR: NEGATIVE
Influenza B by PCR: NEGATIVE
Resp Syncytial Virus by PCR: NEGATIVE
SARS Coronavirus 2 by RT PCR: NEGATIVE

## 2022-05-10 LAB — LACTIC ACID, PLASMA: Lactic Acid, Venous: 1.2 mmol/L (ref 0.5–1.9)

## 2022-05-10 LAB — LIPASE, BLOOD: Lipase: 64 U/L — ABNORMAL HIGH (ref 11–51)

## 2022-05-10 MED ORDER — LIDOCAINE HCL URETHRAL/MUCOSAL 2 % EX GEL
1.0000 | Freq: Once | CUTANEOUS | Status: AC
  Administered 2022-05-10: 1 via TOPICAL
  Filled 2022-05-10: qty 10

## 2022-05-10 MED ORDER — ONDANSETRON 4 MG PO TBDP
4.0000 mg | ORAL_TABLET | Freq: Once | ORAL | Status: AC
  Administered 2022-05-10: 4 mg via ORAL

## 2022-05-10 MED ORDER — ONDANSETRON 4 MG PO TBDP: 4.0000 mg | ORAL_TABLET | Freq: Three times a day (TID) | ORAL | 0 refills | Status: AC | PRN

## 2022-05-10 MED ORDER — LIDOCAINE VISCOUS HCL 2 % MT SOLN
15.0000 mL | Freq: Once | OROMUCOSAL | Status: AC
  Administered 2022-05-10: 15 mL via OROMUCOSAL

## 2022-05-10 MED ORDER — BUTAMBEN-TETRACAINE-BENZOCAINE 2-2-14 % EX AERO
1.0000 | INHALATION_SPRAY | Freq: Once | CUTANEOUS | Status: AC
  Administered 2022-05-10: 1 via TOPICAL
  Filled 2022-05-10: qty 5

## 2022-05-10 MED ORDER — MORPHINE SULFATE (PF) 2 MG/ML IV SOLN
INTRAVENOUS | Status: AC
  Administered 2022-05-10: 2 mg via INTRAVENOUS
  Filled 2022-05-10: qty 1

## 2022-05-10 MED ORDER — IOHEXOL 350 MG/ML SOLN
100.0000 mL | Freq: Once | INTRAVENOUS | Status: AC | PRN
  Administered 2022-05-10: 100 mL via INTRAVENOUS

## 2022-05-10 MED ORDER — ALUM & MAG HYDROXIDE-SIMETH 200-200-20 MG/5ML PO SUSP
30.0000 mL | Freq: Once | ORAL | Status: AC
  Administered 2022-05-10: 30 mL via ORAL

## 2022-05-10 MED ORDER — MORPHINE SULFATE (PF) 4 MG/ML IV SOLN: 4.0000 mg | Freq: Once | INTRAVENOUS | Status: DC

## 2022-05-10 MED ORDER — SODIUM CHLORIDE 0.9 % IV SOLN: Freq: Once | INTRAVENOUS | Status: AC

## 2022-05-10 NOTE — ED Notes (Signed)
Called [facility] @ [phone] to request surgical consult with transfer; spoke to [place] who will connect the providers

## 2022-05-10 NOTE — ED Notes (Signed)
Call received from [facility] crew chief with update on his call to [facility] transfer center as well as [facility]. Per Marrian Salvage was informed [facility] prior to [facility] being dispatched and [facility] reported that they would have transported if call would have been made before 2300 because they sent trucks home. Writer apologized to [employer] crew chief in regards to other options of transport not explored prior.

## 2022-05-10 NOTE — ED Notes (Addendum)
Call received from [facility] crew chief, Gerald Stabs, in reguards to pt transfer. Information requested by caller included: was [facility] and [facility] called first for transport needs prior to [facility]. Writer informed that call was made to transfer center at [facility] but no [facility] note documented. Chris IT trainer that he would reach out to both and call back with update.

## 2022-05-10 NOTE — ED Notes (Signed)
Pt denies nausea at this time.

## 2022-05-10 NOTE — ED Provider Notes (Signed)
[facility] Provider Note    Event Date/Time   First MD Initiated Contact with Patient 05/10/22 2041     (approximate)   History   Abdominal Pain   HPI  [name] [name] is a 67 y.o. female  here with nausea, vomiting, abdominal pain. Pt reports that over the past 24 hours, she has had worsening abdominal pain, nausea, and vomiting. Pain began fairly acutely yesterday with nausea and multiple episodes of emesis, took some tylenol and tried to see if she would feel better. She felt a little better upon awakening but sx returned and she has now has severe nausea, vomiting, and abdominal distension. She feels weak. She has been unable to keep anything down. No fevers.She went to urgent care and had labs and an XRAy concerning for obstruction so was sent here.      Physical Exam   Triage Vital Signs: ED Triage Vitals  Enc Vitals Group     BP 05/10/22 1909 125/88     Pulse Rate 05/10/22 1909 73     Resp 05/10/22 1909 16     Temp 05/10/22 1909 98.4 F (36.9 C)     Temp Source 05/10/22 1909 Oral     SpO2 05/10/22 1909 94 %     Weight 05/10/22 1908 290 lb (131.5 kg)     Height 05/10/22 1908 '5\' 8"'$  (1.727 m)     Head Circumference --      Peak Flow --      Pain Score 05/10/22 1908 1     Pain Loc --      Pain Edu? --      Excl. in [place]? --     Most recent vital signs: Vitals:   05/10/22 2156 05/10/22 2311  BP: (!) 168/82 (!) 158/78  Pulse: (!) 59 (!) 59  Resp: 20 18  Temp:  98.9 F (37.2 C)  SpO2: 94% 93%     General: Awake, no distress.  CV:  Good peripheral perfusion. RRR. Resp:  Normal effort. Lungs clear. Abd:  Mild distension, Myles hernia appreciated, hyperactive bowel sounds noted. Other:  Uncomfortable appearing.    ED Results / Procedures / Treatments   Labs (all labs ordered are listed, but only abnormal results are displayed) Labs Reviewed  RESP PANEL BY RT-PCR (RSV, FLU A&B, COVID)  RVPGX2  LACTIC ACID, PLASMA  LACTIC ACID,  PLASMA     EKG    RADIOLOGY CT A/P: Complex abdominal wall hernia with partial SBO 2/2 herniation of mid ielum in inferior most sac KUB: NG tube in stomach   I also independently reviewed and agree with radiologist interpretations.   PROCEDURES:  Critical Care performed: No  MEDICATIONS ORDERED IN ED: Medications  morphine (PF) 4 MG/ML injection 4 mg (4 mg Intravenous Not Given 05/10/22 2240)  iohexol (OMNIPAQUE) 350 MG/ML injection 100 mL (100 mLs Intravenous Contrast Given 05/10/22 1942)  lidocaine (XYLOCAINE) 2 % jelly 1 Application (1 Application Topical Given 05/10/22 2130)  butamben-tetracaine-benzocaine (CETACAINE) spray 1 spray (1 spray Topical Given 05/10/22 2129)  0.9 %  sodium chloride infusion ( Intravenous New Bag/Given 05/10/22 2237)  morphine (PF) 2 MG/ML injection (2 mg Intravenous Given 05/10/22 2237)     IMPRESSION / MDM / ASSESSMENT AND PLAN / ED COURSE  I reviewed the triage vital signs and the nursing notes.  Differential diagnosis includes, but is not limited to, SBO, hernia incarceration, diverticulitis, ileitis, enteritis, food-borne illness  Patient's presentation is most consistent with acute presentation with potential threat to life or bodily function.  The patient is on the cardiac monitor to evaluate for evidence of arrhythmia and/or significant heart rate changes.  67 yo F here with abd pain and distension, vomiting. Exam and history concerning for obstruction. CBC with no leukocytosis, mild hemoconcentration noted. CMP with slight elevation in Cr compared to baseline. LA normal. COVID negative. CT obtained, reviewed, shows Dusza complex abdominal wall hernia with partial SBO and transition point within the inferior sac.  Discussed with Dr. Hampton Abbot of General Surgery, who feels pt would best be treated at a tertiary care center given the complexity of her hernia. Paged [facility] and discussed with Dr. Reola Calkins, who has accepted.  NGT placed, placement confirmed on plain films. IVF ordered.     FINAL CLINICAL IMPRESSION(S) / ED DIAGNOSES   Final diagnoses:  Small bowel obstruction ([place])     Rx / DC Orders   ED Discharge Orders          Ordered    ondansetron (ZOFRAN-ODT) 4 MG disintegrating tablet  Every 8 hours PRN        05/10/22 2215             Note:  This document was prepared using Dragon voice recognition software and may include unintentional dictation errors.   Duffy Bruce, MD 05/10/22 [phone]

## 2022-05-10 NOTE — ED Notes (Signed)
called to [facility] to verify transport to [facility].Marland KitchenMarland KitchenMarland Kitchen

## 2022-05-10 NOTE — ED Notes (Signed)
Patient is being discharged from the Urgent Care and sent to the Emergency Department via POV . Per Christene Slates, PA, patient is in need of higher level of care due to Bowel obstruction. Patient is aware and verbalizes understanding of plan of care.  Vitals:   05/10/22 1649  BP: 113/63  Pulse: 64  Resp: 18  Temp: 98.5 F (36.9 C)  SpO2: 97%

## 2022-05-10 NOTE — ED Triage Notes (Signed)
Pt to ED from urgent care for possible SBO.  States vomiting and abd pain after eating last night.  Denies hx of SBO or abd surgeries.  States multiple episodes of vomiting last night and today.  Pain is LUQ and mid abd, sharp pain.  Last BM 2 days ago.

## 2022-05-10 NOTE — Discharge Instructions (Signed)
-  We have given you 8 mg zofran ODT and a GI cocktail -You had a CBC, CMP, lipase and KUB  You have been advised to follow up immediately in the emergency department for concerning signs.symptoms. If you declined EMS transport, please have a family member take you directly to the ED at this time. Do not delay. Based on concerns about condition, if you do not follow up in th e ED, you may risk poor outcomes including worsening of condition, delayed treatment and potentially life threatening issues. If you have declined to go to the ED at this time, you should call your PCP immediately to set up a follow up appointment.  Go to ED for red flag symptoms, including; fevers you cannot reduce with Tylenol/Motrin, severe headaches, vision changes, numbness/weakness in part of the body, lethargy, confusion, intractable vomiting, severe dehydration, chest pain, breathing difficulty, severe persistent abdominal or pelvic pain, signs of severe infection (increased redness, swelling of an area), feeling faint or passing out, dizziness, etc. You should especially go to the ED for sudden acute worsening of condition if you do not elect to go at this time.

## 2022-05-10 NOTE — ED Provider Notes (Signed)
[facility]    CSN: [id number] Arrival date & time: 05/10/22  1610      History   Chief Complaint Chief Complaint  Patient presents with   Abdominal Pain   Emesis    HPI [name] [name] is a 67 y.o. female presenting for epigastric abdominal pain that began last night after eating cookies and spaghetti.  Patient says that this symptom started right after she was eating the cookies.  She says the pain is sharp and lasted for a while.  At this time, she says the abdominal pain is generalized and she says it is moderate.  She reports a couple episodes of vomiting last night and throughout the day today.  Believes she has had 8 episodes in total over the past 24 hours.  She says she did have a BM yesterday and it was normal but she thinks it was a little less than she normally has.  She denies diarrhea, black or bloody stool.  She has not had any fevers, cough, congestion, sore throat, breathing focal to, chest pain and denies any urinary pain or urgency.  Additionally no reported flank pain.  She has taken her home medications but no OTC meds for symptoms.  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  Abdominal surgery in the past includes abdominal hysterectomy.  Her past medical history is significant for atrial fibrillation, GERD, gout, heart disease, hypothyroidism, obesity, prediabetes and patient has a pacemaker.  She is also taking Eliquis long-term.    HPI  Past Medical History:  Diagnosis Date   Anemia    Arrhythmia    Atrial fibrillation ([place])    Cancer (HCC)    Endometriosis    GERD (gastroesophageal reflux disease)    Gout    Heart disease    Hypothyroidism    Obesity    Prediabetes    Renal disorder    Ventricular hypertrophy     Patient Active Problem List   Diagnosis Date Noted   C. difficile colitis 09/27/2020   Atrial fibrillation ([place])    Gout    Hypothyroidism    HTN (hypertension)    Hypokalemia    Septic shock ([place])  08/31/2020    Past Surgical History:  Procedure Laterality Date   ABDOMINAL HYSTERECTOMY     ADENOIDECTOMY     DILATION AND CURETTAGE OF UTERUS     TONSILLECTOMY      OB History   No obstetric history on file.      Home Medications    Prior to Admission medications   Medication Sig Start Date End Date Taking? Authorizing Provider  allopurinol (ZYLOPRIM) 100 MG tablet Take 100 mg by mouth 2 (two) times daily.    [provider]  benzonatate (TESSALON) 100 MG capsule Take 2 capsules (200 mg total) by mouth every 8 (eight) hours. 05/13/21   Margarette Canada, NP  carvedilol (COREG) 12.5 MG tablet Take 1 tablet (12.5 mg total) by mouth 2 (two) times daily. 09/11/20 05/13/21  Terrilee Croak, MD  colchicine 0.6 MG tablet Take 0.6 mg by mouth as needed.    [provider]  ELIQUIS 5 MG TABS tablet Take 1 tablet by mouth 2 (two) times daily. 08/08/20   [provider]  flecainide (TAMBOCOR) 100 MG tablet Take 100 mg by mouth 2 (two) times daily. 08/13/20   [provider]  furosemide (LASIX) 20 MG tablet Take 1 tablet (20 mg total) by mouth daily. 10/03/20 05/13/21  Mercy Riding, MD  hydrALAZINE (APRESOLINE) 25 MG tablet Take 1 tablet (25 mg total) by mouth 3 (three) times daily. 09/30/20 05/13/21  Mercy Riding, MD  ipratropium (ATROVENT) 0.06 % nasal spray Place 2 sprays into both nostrils 4 (four) times daily. 05/13/21   Margarette Canada, NP  levothyroxine (SYNTHROID, LEVOTHROID) 175 MCG tablet Take 175 mcg by mouth daily before breakfast.    [provider]  losartan (COZAAR) 100 MG tablet Take 1 tablet (100 mg total) by mouth daily. 09/11/20 05/13/21  Terrilee Croak, MD  Potassium Chloride Crys ER (K-DUR PO) Take by mouth.    [provider]  promethazine-dextromethorphan (PROMETHAZINE-DM) 6.25-15 MG/5ML syrup Take 5 mLs by mouth 4 (four) times daily as needed. 05/13/21   Margarette Canada, NP    Family History Family History  Problem Relation Age of Onset    Diabetes Mother    Hypertension Mother    Heart attack Father     Social History Social History   Tobacco Use   Smoking status: Never   Smokeless tobacco: Never  Vaping Use   Vaping Use: Never used  Substance Use Topics   Alcohol use: No   Drug use: No     Allergies   Amlodipine, Penicillins, Tape, and Wound dressing adhesive   Review of Systems Review of Systems  Constitutional:  Positive for appetite change. Negative for chills, diaphoresis, fatigue and fever.  HENT:  Negative for congestion, rhinorrhea and sore throat.   Respiratory:  Negative for cough and shortness of breath.   Cardiovascular:  Negative for chest pain.  Gastrointestinal:  Positive for abdominal pain, nausea and vomiting. Negative for blood in stool, constipation and diarrhea.  Genitourinary:  Negative for difficulty urinating, dysuria, flank pain, frequency and pelvic pain.  Musculoskeletal:  Negative for arthralgias and myalgias.  Skin:  Negative for rash.  Neurological:  Negative for weakness and headaches.  Hematological:  Negative for adenopathy.     Physical Exam Triage Vital Signs ED Triage Vitals  Enc Vitals Group     BP      Pulse      Resp      Temp      Temp src      SpO2      Weight      Height      Head Circumference      Peak Flow      Pain Score      Pain Loc      Pain Edu?      Excl. in [place]?    No data found.  Updated Vital Signs BP 113/63 (BP Location: Left Arm)   Pulse 64   Temp 98.5 F (36.9 C) (Oral)   Resp 18   SpO2 97%     Physical Exam Vitals and nursing note reviewed.  Constitutional:      General: She is not in acute distress.    Appearance: Normal appearance. She is obese. She is not ill-appearing or toxic-appearing.  HENT:     Head: Normocephalic and atraumatic.     Nose: Nose normal.     Mouth/Throat:     Mouth: Mucous membranes are moist.     Pharynx: Oropharynx is clear.  Eyes:     General: No scleral icterus.       Right eye: No  discharge.        Left eye: No discharge.     Conjunctiva/sclera: Conjunctivae normal.  Cardiovascular:     Rate  and Rhythm: Normal rate and regular rhythm.     Heart sounds: Normal heart sounds.  Pulmonary:     Effort: Pulmonary effort is normal. No respiratory distress.     Breath sounds: Normal breath sounds.  Abdominal:     Palpations: Abdomen is soft.     Tenderness: There is abdominal tenderness (generalized). There is no right CVA tenderness, left CVA tenderness, guarding or rebound.  Musculoskeletal:     Cervical back: Neck supple.  Skin:    General: Skin is dry.  Neurological:     General: No focal deficit present.     Mental Status: She is alert. Mental status is at baseline.     Motor: No weakness.     Gait: Gait normal.  Psychiatric:        Mood and Affect: Mood normal.        Behavior: Behavior normal.        Thought Content: Thought content normal.      UC Treatments / Results  Labs (all labs ordered are listed, but only abnormal results are displayed) Labs Reviewed  CBC WITH DIFFERENTIAL/PLATELET - Abnormal; Notable for the following components:      Result Value   Neutro Abs 8.0 (*)    All other components within normal limits  COMPREHENSIVE METABOLIC PANEL - Abnormal; Notable for the following components:   Sodium 134 (*)    Glucose, Bld 125 (*)    Creatinine, Ser 1.03 (*)    All other components within normal limits  LIPASE, BLOOD - Abnormal; Notable for the following components:   Lipase 64 (*)    All other components within normal limits    EKG   Radiology DG Abdomen 1 View  Result Date: 05/10/2022 CLINICAL DATA:  Abdominal pain, nausea and vomiting. EXAM: ABDOMEN - 1 VIEW COMPARISON:  CT scan from 09/27/2020 FINDINGS: Scattered air in stool throughout the colon and down into the rectum. There are also slightly dilated air-filled loops of small bowel throughout the abdomen. Findings suggest an early or partial small bowel obstruction. No free  air. The soft tissue shadows are maintained. No worrisome calcifications. The bony structures are intact. IMPRESSION: Findings suggest an early or partial small bowel obstruction. Electronically Signed   By: Marijo Sanes M.D.   On: 05/10/2022 17:41    Procedures Procedures (including critical care time)  Medications Ordered in UC Medications  ondansetron (ZOFRAN-ODT) disintegrating tablet 4 mg (4 mg Oral Given 05/10/22 1742)  ondansetron (ZOFRAN-ODT) disintegrating tablet 4 mg (4 mg Oral Given 05/10/22 1742)  alum & mag hydroxide-simeth (MAALOX/MYLANTA) 200-200-20 MG/5ML suspension 30 mL (30 mLs Oral Given 05/10/22 1751)  lidocaine (XYLOCAINE) 2 % viscous mouth solution 15 mL (15 mLs Mouth/Throat Given 05/10/22 1750)    Initial Impression / Assessment and Plan / UC Course  I have reviewed the triage vital signs and the nursing notes.  Pertinent labs & imaging results that were available during my care of the patient were reviewed by me and considered in my medical decision making (see chart for details).   67 year old female presents for generalized abdominal pain, nausea and vomiting.  Symptoms started last night with epigastric pain after eating cookies and spaghetti.  She has not had any fevers, diarrhea or constipation.  She denies any URI symptoms, GU symptoms.  No sick contacts.  Vitals normal and stable.  She is overall well-appearing.  She is not in any acute distress.  Normal HEENT exam.  Chest clear auscultation and heart regular  rate rhythm.  Abdomen is soft and she has generalized tender to palpation without guarding or rebound.  No CVA tenderness.  Ordered CBC, CMP, lipase and 8 mg ODT Zofran as well as KUB.  CBC shows normal WBC count.  Lipase elevated at 64 and glucose elevated at 125.  KUB shows early or partial small bowel obstruction.  Discussed all results with patient.  Advised further workup and observation in the emergency department.  Patient is agreeable and plans to  proceed to [facility].  She does not want to go by EMS and states that she can drive herself.  She is in stable condition.   Final Clinical Impressions(s) / UC Diagnoses   Final diagnoses:  Small bowel obstruction (HCC)  Generalized abdominal pain  Nausea and vomiting, unspecified vomiting type     Discharge Instructions      -We have given you 8 mg zofran ODT and a GI cocktail -You had a CBC, CMP, lipase and KUB  You have been advised to follow up immediately in the emergency department for concerning signs.symptoms. If you declined EMS transport, please have a family member take you directly to the ED at this time. Do not delay. Based on concerns about condition, if you do not follow up in th e ED, you may risk poor outcomes including worsening of condition, delayed treatment and potentially life threatening issues. If you have declined to go to the ED at this time, you should call your PCP immediately to set up a follow up appointment.  Go to ED for red flag symptoms, including; fevers you cannot reduce with Tylenol/Motrin, severe headaches, vision changes, numbness/weakness in part of the body, lethargy, confusion, intractable vomiting, severe dehydration, chest pain, breathing difficulty, severe persistent abdominal or pelvic pain, signs of severe infection (increased redness, swelling of an area), feeling faint or passing out, dizziness, etc. You should especially go to the ED for sudden acute worsening of condition if you do not elect to go at this time.      ED Prescriptions   None    PDMP not reviewed this encounter.   Danton Clap, PA-C 05/10/22 [phone]

## 2022-05-10 NOTE — ED Triage Notes (Signed)
Pt presents with epigastric abdominal pain last night that started after eating. She also reports 8+ cases of vomiting that started last night into today.
